# Patient Record
Sex: Female | Born: 1975
Health system: Southern US, Community
[De-identification: ages and names within clinical notes are randomized; demographics above are authoritative.]

## PROBLEM LIST (undated history)

## (undated) DIAGNOSIS — Z8719 Personal history of other diseases of the digestive system: Secondary | ICD-10-CM

## (undated) DIAGNOSIS — Z9289 Personal history of other medical treatment: Secondary | ICD-10-CM

## (undated) DIAGNOSIS — D649 Anemia, unspecified: Secondary | ICD-10-CM

## (undated) DIAGNOSIS — M797 Fibromyalgia: Secondary | ICD-10-CM

## (undated) DIAGNOSIS — C8590 Non-Hodgkin lymphoma, unspecified, unspecified site: Secondary | ICD-10-CM

## (undated) DIAGNOSIS — J329 Chronic sinusitis, unspecified: Secondary | ICD-10-CM

## (undated) DIAGNOSIS — R102 Pelvic and perineal pain: Secondary | ICD-10-CM

## (undated) DIAGNOSIS — N920 Excessive and frequent menstruation with regular cycle: Secondary | ICD-10-CM

## (undated) DIAGNOSIS — R768 Other specified abnormal immunological findings in serum: Secondary | ICD-10-CM

## (undated) DIAGNOSIS — R112 Nausea with vomiting, unspecified: Secondary | ICD-10-CM

## (undated) DIAGNOSIS — E669 Obesity, unspecified: Secondary | ICD-10-CM

## (undated) DIAGNOSIS — J45909 Unspecified asthma, uncomplicated: Secondary | ICD-10-CM

## (undated) DIAGNOSIS — D251 Intramural leiomyoma of uterus: Secondary | ICD-10-CM

## (undated) DIAGNOSIS — M1990 Unspecified osteoarthritis, unspecified site: Secondary | ICD-10-CM

## (undated) DIAGNOSIS — E611 Iron deficiency: Secondary | ICD-10-CM

## (undated) DIAGNOSIS — M2550 Pain in unspecified joint: Secondary | ICD-10-CM

## (undated) DIAGNOSIS — S92902A Unspecified fracture of left foot, initial encounter for closed fracture: Secondary | ICD-10-CM

## (undated) DIAGNOSIS — S92309A Fracture of unspecified metatarsal bone(s), unspecified foot, initial encounter for closed fracture: Secondary | ICD-10-CM

## (undated) DIAGNOSIS — F410 Panic disorder [episodic paroxysmal anxiety] without agoraphobia: Secondary | ICD-10-CM

## (undated) DIAGNOSIS — L709 Acne, unspecified: Secondary | ICD-10-CM

## (undated) DIAGNOSIS — Z9889 Other specified postprocedural states: Secondary | ICD-10-CM

## (undated) DIAGNOSIS — E039 Hypothyroidism, unspecified: Secondary | ICD-10-CM

## (undated) HISTORY — DX: Unspecified asthma, uncomplicated: J45.909

## (undated) HISTORY — DX: Anemia, unspecified: D64.9

## (undated) HISTORY — DX: Acne, unspecified: L70.9

## (undated) HISTORY — PX: BUNIONECTOMY: SHX129

## (undated) HISTORY — DX: Pain in unspecified joint: M25.50

## (undated) HISTORY — PX: DILATION AND EVACUATION: SHX1459

## (undated) HISTORY — PX: APPENDECTOMY: SHX54

## (undated) HISTORY — PX: TUBAL LIGATION: SHX77

## (undated) HISTORY — DX: Chronic sinusitis, unspecified: J32.9

## (undated) HISTORY — DX: Iron deficiency: E61.1

## (undated) HISTORY — DX: Other specified abnormal immunological findings in serum: R76.8

---

## 2003-08-31 ENCOUNTER — Inpatient Hospital Stay (HOSPITAL_COMMUNITY): Admission: AD | Admit: 2003-08-31 | Discharge: 2003-09-03 | Payer: Self-pay | Admitting: Obstetrics and Gynecology

## 2003-09-04 ENCOUNTER — Inpatient Hospital Stay (HOSPITAL_COMMUNITY): Admission: AD | Admit: 2003-09-04 | Discharge: 2003-09-04 | Payer: Self-pay | Admitting: Obstetrics and Gynecology

## 2003-09-04 ENCOUNTER — Encounter: Admission: RE | Admit: 2003-09-04 | Discharge: 2003-10-04 | Payer: Self-pay | Admitting: Obstetrics and Gynecology

## 2003-10-27 ENCOUNTER — Other Ambulatory Visit: Admission: RE | Admit: 2003-10-27 | Discharge: 2003-10-27 | Payer: Self-pay | Admitting: Obstetrics and Gynecology

## 2004-01-26 ENCOUNTER — Emergency Department (HOSPITAL_COMMUNITY): Admission: EM | Admit: 2004-01-26 | Discharge: 2004-01-26 | Payer: Self-pay | Admitting: Emergency Medicine

## 2004-10-13 ENCOUNTER — Ambulatory Visit (HOSPITAL_COMMUNITY): Admission: RE | Admit: 2004-10-13 | Discharge: 2004-10-13 | Payer: Self-pay | Admitting: Obstetrics and Gynecology

## 2004-10-19 ENCOUNTER — Inpatient Hospital Stay (HOSPITAL_COMMUNITY): Admission: AD | Admit: 2004-10-19 | Discharge: 2004-10-19 | Payer: Self-pay | Admitting: Obstetrics and Gynecology

## 2004-11-01 ENCOUNTER — Ambulatory Visit (HOSPITAL_COMMUNITY): Admission: RE | Admit: 2004-11-01 | Discharge: 2004-11-01 | Payer: Self-pay | Admitting: Obstetrics and Gynecology

## 2004-11-09 ENCOUNTER — Ambulatory Visit: Payer: Self-pay | Admitting: *Deleted

## 2004-11-09 ENCOUNTER — Ambulatory Visit (HOSPITAL_COMMUNITY): Admission: RE | Admit: 2004-11-09 | Discharge: 2004-11-09 | Payer: Self-pay | Admitting: Obstetrics and Gynecology

## 2004-11-12 ENCOUNTER — Ambulatory Visit: Payer: Self-pay | Admitting: Family Medicine

## 2004-11-15 ENCOUNTER — Ambulatory Visit (HOSPITAL_COMMUNITY): Admission: RE | Admit: 2004-11-15 | Discharge: 2004-11-15 | Payer: Self-pay | Admitting: Obstetrics and Gynecology

## 2004-12-26 ENCOUNTER — Inpatient Hospital Stay (HOSPITAL_COMMUNITY): Admission: AD | Admit: 2004-12-26 | Discharge: 2004-12-28 | Payer: Self-pay | Admitting: Obstetrics and Gynecology

## 2005-02-15 ENCOUNTER — Ambulatory Visit (HOSPITAL_COMMUNITY): Admission: RE | Admit: 2005-02-15 | Discharge: 2005-02-15 | Payer: Self-pay | Admitting: Obstetrics and Gynecology

## 2006-07-11 ENCOUNTER — Other Ambulatory Visit: Admission: RE | Admit: 2006-07-11 | Discharge: 2006-07-11 | Payer: Self-pay | Admitting: Obstetrics and Gynecology

## 2006-09-14 DIAGNOSIS — Z8719 Personal history of other diseases of the digestive system: Secondary | ICD-10-CM

## 2006-09-14 HISTORY — DX: Personal history of other diseases of the digestive system: Z87.19

## 2006-09-30 ENCOUNTER — Inpatient Hospital Stay (HOSPITAL_COMMUNITY): Admission: EM | Admit: 2006-09-30 | Discharge: 2006-10-02 | Payer: Self-pay | Admitting: Emergency Medicine

## 2006-09-30 ENCOUNTER — Encounter (INDEPENDENT_AMBULATORY_CARE_PROVIDER_SITE_OTHER): Payer: Self-pay | Admitting: Surgery

## 2008-06-02 ENCOUNTER — Other Ambulatory Visit: Admission: RE | Admit: 2008-06-02 | Discharge: 2008-06-02 | Payer: Self-pay | Admitting: Obstetrics and Gynecology

## 2009-12-14 LAB — CONVERTED CEMR LAB: Pap Smear: NORMAL

## 2010-04-29 ENCOUNTER — Ambulatory Visit: Payer: Self-pay | Admitting: Family Medicine

## 2010-04-29 DIAGNOSIS — R51 Headache: Secondary | ICD-10-CM | POA: Insufficient documentation

## 2010-04-29 DIAGNOSIS — J45909 Unspecified asthma, uncomplicated: Secondary | ICD-10-CM | POA: Insufficient documentation

## 2010-04-29 DIAGNOSIS — F411 Generalized anxiety disorder: Secondary | ICD-10-CM | POA: Insufficient documentation

## 2010-04-29 DIAGNOSIS — G43909 Migraine, unspecified, not intractable, without status migrainosus: Secondary | ICD-10-CM | POA: Insufficient documentation

## 2010-04-29 DIAGNOSIS — J329 Chronic sinusitis, unspecified: Secondary | ICD-10-CM | POA: Insufficient documentation

## 2010-04-29 DIAGNOSIS — R519 Headache, unspecified: Secondary | ICD-10-CM | POA: Insufficient documentation

## 2010-06-17 NOTE — Letter (Signed)
Summary: Out of Work  Adult nurse at [employer]  [address]   [place], [place] [address]   Phone: [phone]  Fax: [phone]    April 29, 2010   Employee:  Basilia Jumbo    To Whom It May Concern:   For Medical reasons, please excuse the above named employee from work for the following dates:  Start:   Thursday, 04/29/2010  End:   Thursday, 04/29/2010  If you need additional information, please feel free to contact our office.         Sincerely,        Evelena Peat, MD

## 2010-06-17 NOTE — Assessment & Plan Note (Signed)
Summary: new to est-requesting cpx   Vital Signs:  Patient profile:   35 year old female Menstrual status:  regular LMP:     03/30/2010 Height:      63.25 inches Weight:      163 pounds BMI:     28.75 Temp:     98.8 degrees F oral Pulse rate:   72 / minute Pulse rhythm:   regular Resp:     12 per minute BP sitting:   130 / 90  (left arm) Cuff size:   regular  Vitals Entered By: Sid Falcon LPN (April 29, 2010 2:58 PM) LMP (date): 03/30/2010     Menstrual Status regular Enter LMP: 03/30/2010 Last PAP Result normal   History of Present Illness: Patient is seen to establish care.  PMH signif for childhood asthma (mild intermittent), migraine headaches, frequent UTIs, and ? of panic attacks.  Takes Doxycycline per derm for some mild acne. BTL 2006.  Appy 2009.  NKDA.  FH unrevealing.  Married with 4 children.  [employer].  Nonsmoker. No ETOH.   Has frequent headaches which seem to be of different type. One is stress induced and unilateral, throbbing, severe, with freq visual aura. Another, more frequent, post occipital and assoc with nasal congestion.  Assoc with  facial pressure.  Has been on multiple courses of antibiotics without improvement. Nasonex without relief.  Has episodes of anxiety that come in different settings without warning.  Symptoms are sometimes severe and relatively short lived-usually only few minutes.  Multiple physical symptoms including sweats, nausea, palpitations, dyspnea,   Occur about 2 to 3 times per week.  Not assoc with any  specific stressors.  Preventive Screening-Counseling & Management  Alcohol-Tobacco     Smoking Status: never  Caffeine-Diet-Exercise     Does Patient Exercise: yes  Past History:  Family History: Last updated: 04/29/2010 Maternal grandmother, unknown [place]  Social History: Last updated: 04/29/2010 Occupation:  [employer] Married Never Smoked Alcohol use-no Regular exercise-yes 5  pregnancies 4 live births  Risk Factors: Exercise: yes (04/29/2010)  Risk Factors: Smoking Status: never (04/29/2010)  Past Medical History: Asthma, mild intermittent. Headache/migraines ?panic attacks  Past Surgical History: Appendectomy 2009 Tubal ligation 2006 PMH-FH-SH reviewed for relevance  Family History: Maternal grandmother, unknown [place]  Social History: Occupation:  [employer] Married Never Smoked Alcohol use-no Regular exercise-yes 5 pregnancies 4 live births Smoking Status:  never Occupation:  employed Does Patient Exercise:  yes  Review of Systems  The patient denies anorexia, fever, weight loss, weight gain, vision loss, decreased hearing, chest pain, syncope, dyspnea on exertion, peripheral edema, prolonged cough, headaches, hemoptysis, abdominal pain, melena, hematochezia, severe indigestion/heartburn, hematuria, incontinence, muscle weakness, depression, and enlarged lymph nodes.    Physical Exam  General:  Well-developed,well-nourished,in no acute distress; alert,appropriate and cooperative throughout examination Head:  Normocephalic and atraumatic without obvious abnormalities. No apparent alopecia or balding. Eyes:  No corneal or conjunctival inflammation noted. EOMI. Perrla. Funduscopic exam benign, without hemorrhages, exudates or papilledema. Vision grossly normal. Ears:  External ear exam shows no significant lesions or deformities.  Otoscopic examination reveals clear canals, tympanic membranes are intact bilaterally without bulging, retraction, inflammation or discharge. Hearing is grossly normal bilaterally. Mouth:  Oral mucosa and oropharynx without lesions or exudates.  Teeth in good repair. Neck:  No deformities, masses, or tenderness noted. Lungs:  Normal respiratory effort, chest expands symmetrically. Lungs are clear to auscultation, no crackles or wheezes. Heart:  Normal rate and regular rhythm. S1 and S2  normal without gallop,  murmur, click, rub or other extra sounds. Extremities:  No clubbing, cyanosis, edema, or deformity noted with normal full range of motion of all joints.   Neurologic:  alert & oriented X3, cranial nerves II-XII intact, and strength normal in all extremities.   Cervical Nodes:  No lymphadenopathy noted Psych:  normally interactive, good eye contact, not anxious appearing, and not depressed appearing.     Impression & Recommendations:  Problem # 1:  MIGRAINE HEADACHE (ICD-346.90) samples of Maxalt MLT to try as needed discussed possible triggers.  Problem # 2:  SINUSITIS, RECURRENT (ICD-473.9) rec trial of Netti pot with saline irrigation. Her updated medication list for this problem includes:    Doxycycline Hyclate 50 Mg Caps (Doxycycline hyclate) ..... One tab two times a day  Problem # 3:  ANXIETY STATE, UNSPECIFIED (ICD-300.00) suspect Panic Attacks by hx.  Discusse use of preventitive such as SSRI and she is reluctant at this time.  Complete Medication List: 1)  Doxycycline Hyclate 50 Mg Caps (Doxycycline hyclate) .... One tab two times a day  Patient Instructions: 1)  Maxalt MLT 10 mg one at onset of migraine and may repeat one in 2 hours as needed 2)  Netti pot for sinus irrigation use twice daily.   Orders Added: 1)  New Patient Level III [16109]    Preventive Care Screening  Pap Smear:    Date:  12/14/2009    Results:  normal   Last Tetanus Booster:    Date:  05/17/2007    Results:  Historical

## 2010-06-30 ENCOUNTER — Emergency Department (HOSPITAL_COMMUNITY): Payer: Federal, State, Local not specified - PPO

## 2010-06-30 ENCOUNTER — Emergency Department (HOSPITAL_COMMUNITY)
Admission: EM | Admit: 2010-06-30 | Discharge: 2010-06-30 | Disposition: A | Discharge status: Home / Self Care | Payer: Federal, State, Local not specified - PPO | Attending: Emergency Medicine | Admitting: Emergency Medicine

## 2010-06-30 ENCOUNTER — Encounter (HOSPITAL_COMMUNITY): Payer: Self-pay

## 2010-06-30 DIAGNOSIS — R531 Weakness: Secondary | ICD-10-CM

## 2010-06-30 DIAGNOSIS — R42 Dizziness and giddiness: Secondary | ICD-10-CM | POA: Insufficient documentation

## 2010-06-30 DIAGNOSIS — R5383 Other fatigue: Secondary | ICD-10-CM | POA: Insufficient documentation

## 2010-06-30 DIAGNOSIS — J3489 Other specified disorders of nose and nasal sinuses: Secondary | ICD-10-CM | POA: Insufficient documentation

## 2010-06-30 DIAGNOSIS — R0789 Other chest pain: Secondary | ICD-10-CM | POA: Insufficient documentation

## 2010-06-30 DIAGNOSIS — R0602 Shortness of breath: Secondary | ICD-10-CM | POA: Insufficient documentation

## 2010-06-30 DIAGNOSIS — R5381 Other malaise: Secondary | ICD-10-CM | POA: Insufficient documentation

## 2010-06-30 LAB — URINALYSIS, ROUTINE W REFLEX MICROSCOPIC
Bilirubin Urine: NEGATIVE
Hgb urine dipstick: NEGATIVE
Ketones, ur: NEGATIVE mg/dL
Nitrite: NEGATIVE
Protein, ur: NEGATIVE mg/dL
Specific Gravity, Urine: 1.021 (ref 1.005–1.030)
Urine Glucose, Fasting: NEGATIVE mg/dL
Urobilinogen, UA: 0.2 mg/dL (ref 0.0–1.0)
pH: 6.5 (ref 5.0–8.0)

## 2010-06-30 LAB — BASIC METABOLIC PANEL WITH GFR
BUN: 9 mg/dL (ref 6–23)
CO2: 24 meq/L (ref 19–32)
Calcium: 8.7 mg/dL (ref 8.4–10.5)
Chloride: 107 meq/L (ref 96–112)
Creatinine, Ser: 0.76 mg/dL (ref 0.4–1.2)
GFR calc non Af Amer: >60 mL/min
Glucose, Bld: 97 mg/dL (ref 70–99)
Potassium: 3.4 meq/L — ABNORMAL LOW (ref 3.5–5.1)
Sodium: 137 meq/L (ref 135–145)

## 2010-06-30 LAB — PREGNANCY, URINE: Preg Test, Ur: NEGATIVE

## 2010-06-30 LAB — CBC
HCT: 38.8 % (ref 36.0–46.0)
Hemoglobin: 13.7 g/dL (ref 12.0–15.0)
MCH: 32.4 pg (ref 26.0–34.0)
MCHC: 35.3 g/dL (ref 30.0–36.0)
MCV: 91.7 fL (ref 78.0–100.0)
Platelets: 228 K/uL (ref 150–400)
RBC: 4.23 MIL/uL (ref 3.87–5.11)
RDW: 12.8 % (ref 11.5–15.5)
WBC: 6.7 K/uL (ref 4.0–10.5)

## 2010-06-30 LAB — DIFFERENTIAL
Lymphs Abs: 2.3 10*3/uL (ref 0.7–4.0)
Monocytes Absolute: 0.5 10*3/uL (ref 0.1–1.0)
Monocytes Relative: 7 % (ref 3–12)
Neutro Abs: 3.8 10*3/uL (ref 1.7–7.7)

## 2010-09-28 NOTE — Op Note (Signed)
Sydney Vaughn, FAISON NO.:  [id number]   MEDICAL RECORD NO.:  [id number]          PATIENT TYPE:  INP   LOCATION:  0454                         FACILITY[facility]  [facility]   PHYSICIAN:  Ardeth Sportsman, MD     DATE OF BIRTH:  1975/05/30   DATE OF PROCEDURE:  09/30/2006  DATE OF DISCHARGE:                               OPERATIVE REPORT   SURGEON:  Ardeth Sportsman, M.D.   ASSISTANT:  None.   PREOPERATIVE DIAGNOSIS:  Acute appendicitis.   POSTOPERATIVE DIAGNOSIS:  Acute suppurative appendicitis.   PROCEDURE PERFORMED:  Diagnostic laparoscopy with appendectomy.   ANESTHESIA:  1. General.  2. Local anesthetic in a field block around all port sites.   SPECIMENS:  Appendix.   DRAINS:  None.   ESTIMATED BLOOD LOSS:  Less than 5 mL.   COMPLICATIONS:  None apparent.   INDICATIONS FOR PROCEDURE:  Ms. Sydney Vaughn is a 35 year old female with a  history of abdominal pain that focused down in the right lower quadrant  with exam, story, and CT scan findings strongly suggestive of acute  appendicitis.  The pathophysiology of appendicitis was explained,  options were discussed, differential diagnosis discussed.  A  recommendation was made for diagnostic laparoscopy with appendectomy.  The risks of stroke, heart attack, deep venous thrombosis, pulmonary  embolism, and death were discussed.  The risks of bleeding, need for  transfusion, wound infection, abscess, injury to other organs,  incisional pain, bowel obstruction, prolonged recovery, and other risks  were discussed.  Questions were answered and she and her mother agreed  to proceed.   OPERATIVE FINDINGS:  She had obvious acute appendicitis with some  suppuration, primarily in the right lower quadrant, but some slightly in  the pelvis, as well.  There was only some mild adhesions, the rest of  her anatomy appeared to be unremarkable.   DESCRIPTION OF PROCEDURE:  Informed consent was confirmed.  The patient  had already  received IV antibiotics.  She underwent general anesthesia  without difficulty.  She had sequential compressive devices active  during the entire case.  She had a Foley catheter placed sterilely.  She  was positioned supine with both arms tucked.  Her abdomen was prepped  and draped in a sterile fashion.   Entry was gained in the abdomen with the patient in steep reversed  Trendelenburg right side up using a 5 mm 0 degree scope and optical  entry.  Capnoperitoneum to 15 mmHg provided good abdominal insufflation.  Under direct visualization, a 5 mm port was placed through the umbilicus  and a 12 mm port was placed in the suprapubic region near the midline.  There was no evidence of any injury.  Diagnostic laparoscopy was  performed and showed no abnormalities except for down in the right lower  quadrant where an inflamed blind ended loop consistent with the appendix  was noted with some separation consistent with appendicitis.  There was  a little bit of murky seropurulent fluid down in the pelvis, as well.  Mild adhesions were freed up carefully and the appendix was able  to be  elevated anteriorly.  A window was made in the base of the appendix and  the appendix was transected using a laparoscopic stapler with a good  result taking a healthy cuff of the cecum.  The mesentery was ligated  using controlled focused cautery with a good result.  The gallbladder  was removed inside an endocatch bag intact.  This was removed out the 12  mm port.   The fascial defect of the 12 mm port was reapproximated using a 0 Vicryl  stitch using laparoscopic suture passer under direct visualization.  Copious irrigation was done of over 2 liters of saline with ultimately a  nice clear return.  The staple line was nice and intact and the  appendiceal mesentery showed no bleeding.  The umbilical port was  removed.  There was no bleeding on the lower two abdominal ports.  Capnoperitoneum was evacuated.  The  superior right upper quadrant port  site was removed.  The fascial stitch was tied down.  The skin was  closed using 4-0 Monocryl stitch.  A sterile dressing was applied.  The  patient was extubated and taken to the recovery room in stable  condition.   I explained the operative findings to the patient's mother.  Postop  instructions were given, questions answered.  She expressed  understanding and appreciation.      Ardeth Sportsman, MD  Electronically Signed     SCG/MEDQ  D:  09/30/2006  T:  09/30/2006  Job:  [id number]   cc:   Leonette Most A. Sydnee Cabal, MD  Fax: [phone]

## 2010-09-28 NOTE — Discharge Summary (Signed)
NAMEVERLON, CARCIONE NO.:  [id number]   MEDICAL RECORD NO.:  [id number]          PATIENT TYPE:  INP   LOCATION:  1540                         FACILITY:  [facility]   PHYSICIAN:  Ardeth Sportsman, MD     DATE OF BIRTH:  12-Jan-1976   DATE OF ADMISSION:  09/29/2006  DATE OF DISCHARGE:                               DISCHARGE SUMMARY   DIAGNOSES:  Acute suppurative appendicitis.   PROCEDURES PERFORMED:  Laparoscopic diagnostic laparoscopy with  appendectomy on Sep 30, 2006.   SUMMARY OF HOSPITAL COURSE:  Ms. Thurlow is a 35 year old female who had  appendicitis with suppuration who underwent a laparoscopic appendectomy.  Postoperatively, she did have some pain issues and dropped her  hemoglobin down to around 11- 11.5, where it stabilized the last 24  hours.  She was having flatus, no nausea or vomiting, and tolerating at  least liquids and some solid diet.   Based on these improvements I thought it would be reasonable for her to  be discharged home with the following instructions:  1. She should do ice packs or heating pad around-the-clock.  2. She should do ibuprofen 600-800 mg q.a.c. and q.h.s. x1 week.  3. She should take Percocet 5/325 one to two p.o. q.4h. p.r.n.      breakthrough pain.  4. She should call if she has any fevers, chills, sweats, nausea or      vomiting, uncontrolled diarrhea or abdominal pain, or any other      concerns.  5. She should shower daily, keep her incision dry.  Call if she has      any evidence of wound infection, drainage, or worsening concerns.  6. She should take Augmentin 875 mg p.o. b.i.d. x7 days to decrease      her risk of wound infection or abscess.  7. She should follow up and see me about 2-3 weeks to make sure there      are not any issues.      Ardeth Sportsman, MD  Electronically Signed     SCG/MEDQ  D:  10/02/2006  T:  10/02/2006  Job:  [id number]

## 2010-09-28 NOTE — Consult Note (Signed)
NAMEZORANA, BROCKWELL NO.:  [id number]   MEDICAL RECORD NO.:  [id number]          PATIENT TYPE:  EMS   LOCATION:  ED                           [facility]AC[facility]  [facility]   PHYSICIAN:  Ardeth Sportsman, MD     DATE OF BIRTH:  [birth year]   DATE OF CONSULTATION:  09/30/2006  DATE OF DISCHARGE:                                 CONSULTATION   REASON FOR CONSULTATION:  Probable appendicitis.   HISTORY OF PRESENT ILLNESS:  Ms. Massman is a 35 year old female  previously healthy who started having diffuse upper abdominal pain  especially in the right subcostal and epigastric regions.  She had never  had pain like this before.  She started getting some nausea and actually  threw up a little bit of liquid.  The pain gradually intensified over  the day and she came to the emergency room to be evaluated.  The pain  has now migrated more down to the right lower quadrant and is much more  focused and intense.  Her appetite has decreased.  She is feeling rather  cold and has some chills and feels some subjective fevers, as well.  She  denies any dysphagia, heartburn, or reflux.  She has never had any  history of inflammatory bowel disease, hematochezia, or melena.   The emergency department had ruled out any cardiac or pulmonary etiology  and CT scan shows findings concerning for appendicitis and I am asked to  evaluate.   PAST MEDICAL HISTORY:  Otherwise negative.   PAST SURGICAL HISTORY:  Tubal ligation.   ALLERGIES:  None.   SOCIAL HISTORY:  No tobacco, alcohol, or drug use.  She lives with her  mother.  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   REVIEW OF SYSTEMS:  CONSTITUTIONAL:  Positive for fevers and chills.  No  major change in her weight.  Otherwise, negative.  EENT, cardiac,  respiratory, otherwise are negative.  GYN, GU, musculoskeletal,  neurological, psychiatric, allergy, hematology, lymph, dermatologic are  otherwise negative.  GI as noted per  HPI.   PHYSICAL EXAMINATION:  VITAL SIGNS:  Temperature 98.7, pulse 80s to 108,  respirations around 16, blood pressure systolic around 100 to 130. She  is 8 out of 10 pain.  She has probably got a BMI around 30.  GENERAL:  Well developed, well nourished overweight female, obviously  uncomfortable but not frankly toxic.  PSYCHIATRIC:  She is a little anxious but consolable.  She has at least  average intelligence.  No evidence of dementia or psychosis or paranoia.  NEUROLOGICAL:  Cranial nerves 2-12 intact.  Hand grip 5/5, equal, and  symmetrical.  No resting or intention tremors.  HEENT:  Pupils equal, round, and reactive to light.  Extraocular  movements intact.  Sclerae nonicteric or injected.  She is normocephalic  with no facial asymmetry.  Mucous membranes are dry.  The nasopharynx  and oropharynx are clear.  NECK:  Supple without masses.  Trachea midline.  HEART:  Regular rate and rhythm, no murmurs, gallops, and rubs.  CHEST: Clear to auscultation  bilaterally.  No wheezes, rales or rhonchi.  No significant pain to ribs or sternal compression.  ABDOMEN:  Soft and not particularly distended.  She has exquisite  tenderness in the right lower quadrant at McBurney's point.  No Murphy's  sign.  The rest of her quadrants of her abdomen were rather soft and not  too involved.  Her umbilicus does not have evidence of hernia.  GU:  Normal female external genitalia with no inguinal herniae.  No  vaginal bleeding or discharge.  RECTAL:  Deferred.  EXTREMITIES:  No cyanosis or clubbing.  MUSCULOSKELETAL:  Full range of motion of shoulders, wrists, hips,  knees, and ankles.  LYMPHS:  No head, neck, axillary, or groin lymphadenopathy.  SKIN:  No petechiae, no purpura, no lesions.   LABORATORY DATA:  White count is elevated around 15.6 with a left shift.  Potassium is 3.6 which is borderline low.  Urinalysis is negative.  CT  scan shows classic appendicitis with no definite abscess.    ASSESSMENT/PLAN:  35 year old female with classic story, exam, and CT  scan findings strongly suggestive of acute appendicitis.  The anatomy  and physiology of the digestive tract was discussed.  The  pathophysioloigy of appendicitis with its risks of perforation and  abscess formation was explained.  The options were discussed and  recommendation was made for diagnostic laparoscopy with appendectomy.  The risks such as stroke, heart attack, deep venous thrombosis,  pulmonary embolism, and death were discussed.  The risks such as  bleeding with need for transfusion, wound infection, abscess, injury to  other organs, potential hernia, and prolonged pain, and other risks were  discussed.  The possible hospital course, whether it be less than 24  hour stay to over a weeks stay with IV antibiotics was discussed.  Questions were answered and she and her mother agreed to proceed.      Ardeth Sportsman, MD  Electronically Signed     SCG/MEDQ  D:  09/30/2006  T:  09/30/2006  Job:  [id number]   cc:   Leonette Most A. Sydnee Cabal, MD  Fax: [phone]

## 2010-10-01 NOTE — H&P (Signed)
Sydney Vaughn, TIGUE NO.:  [id number]   MEDICAL RECORD NO.:  [id number]           PATIENT TYPE:   LOCATION:                                 FACILITY:   PHYSICIAN:  Charles A. Delcambre, MDDATE OF BIRTH:  1975-11-21   DATE OF ADMISSION:  12/29/2004  DATE OF DISCHARGE:                                HISTORY & PHYSICAL   This patient is being admitted on December 29, 2004 for induction secondary to  term pregnancy and positive ANA.  She has been on baby aspirin a day and has  had antenatal testing normal up to this point.  Pregnancy has otherwise been  complicated by positive RPR false positive with TPPA negative.  Blood tests  have been noted to be normal at blood type O+.  Antibody screen negative.  Rubella immune.  Hepatitis B surface antigen negative.  HIV declined.  Quad  screen declined.  One-hour Glucola 126.  Hemoglobin Oct 07, 2004 12.5.  Pap  negative.  GC/Chlamydia negative.  Urinalysis negative.  TSH normal.  Cystic  fibrosis declined.  EDC is December 26, 2004.  The patient will be at 40 weeks  and 3 days on induction date.  She also desires tubal ligation.  We plan  this on an interval basis unless the patient undergoes cesarean section  during this admission.  She did have some first trimester bleeding that did  resolve.   PAST MEDICAL HISTORY:  1.  Asthma.  No medications at this time required.  2.  Difficult epidural with spinal headaches and difficult induction last      pregnancy, long induction after induction with an unfavorable cervix.   PAST SURGICAL HISTORY:  None.   MEDICATIONS:  1.  Baby aspirin a day.  2.  Prenatal vitamins.   ALLERGIES:  No known drug allergies.   SOCIAL HISTORY:  No tobacco, ethanol, or drug use.  Patient is married in a  monogamous relationship with her husband.   FAMILY HISTORY:  Noncontributory.   REVIEW OF SYSTEMS:  As noted above.   PHYSICAL EXAMINATION:  GENERAL:  Alert and oriented x3.  No distress.  VITAL SIGNS:  Blood pressure 118/78, respirations 18, pulse 90, weight 203  pounds.  HEENT:  Grossly within normal limits.  NECK:  Supple without thyromegaly or adenopathy.  LUNGS:  Clear bilaterally.  BACK:  No CVAT.  Vertebral column nontender to palpation.  BREASTS:  No masses, tenderness, discharge, skin, nipple changes  bilaterally.  ABDOMEN:  Soft, nontender, gravid.  Fundal height 38 cm.  PELVIC:  Cervix posterior, roughly 2 cm.  Difficult to palpate secondary to  patient discomfort with examination.  Examination terminated at that point.  Soft and could not tell effacement.  EXTREMITIES:  Mild edema bilaterally.   ASSESSMENT:  Intrauterine pregnancy.  Plan to be at least 40 weeks 3 days at  time of induction.  Positive ANA.  False positive RPR.  TPPA negative.   PLAN:  Admission.  Plan to high dose Pitocin induction one of August 16.  All questions were answered and she gives informed  consent.  Will proceed as  outlined.  She is group B Strep negative as well and will proceed as  outlined.      Charles A. Sydnee Cabal, MD  Electronically Signed     CAD/MEDQ  D:  12/16/2004  T:  12/16/2004  Job:  [id number]

## 2010-10-01 NOTE — Op Note (Signed)
Sydney Vaughn, BELLES NO.:  [id number]   MEDICAL RECORD NO.:  [id number]          PATIENT TYPE:  AMB   LOCATION:  SDC                           FACILITY:  [facility]   PHYSICIAN:  Charles A. Delcambre, MDDATE OF BIRTH:  26-May-1975   DATE OF PROCEDURE:  02/15/2005  DATE OF DISCHARGE:                                 OPERATIVE REPORT   PREOPERATIVE DIAGNOSIS:  Undesired fertility.   POSTOPERATIVE DIAGNOSIS:  Undesired fertility.   PROCEDURE:  Laparoscopic bilateral tubal ligation with Falope rings.   SURGEON:  Charles A. Sydnee Cabal, M.D.   ASSISTANT:  None.   COMPLICATIONS:  None.   ESTIMATED BLOOD LOSS:  None.   FINDINGS:  Normal pelvis.   Instrument, sponge and needle count were correct x2.   ANESTHESIA:  General by endotracheal route.   DESCRIPTION OF PROCEDURE:  The patient was taken to the operating room and  placed in the supine position.  A general anesthetic was induced without  difficulty.  She was then placed in the dorsal lithotomy position in  universal stirrups and a sterile prep and drape was undertaken.  A Cohen  cannula was placed on the cervix with a single-tooth tenaculum and attention  was turned to the abdomen.  Marcaine 0.25% plain was then injected at the  umbilicus and suprapubic areas for the trocars and a 1 cm incision was made  at the umbilicus.  A Veress needle was placed with anterior traction on the  abdominal wall, aspiration, injection, reaspiration, hanging drop test and  insufflation to less than 8 mmHg, a 2 L CO2 insufflation per minute, all  indicating intraperitoneal location.  Adequate pneumoperitoneum was obtained  at 2.5 L insufflation and the Veress needle was removed with anterior  traction on the abdominal wall.  A 10 mm port was then placed.  Immediate  verification of intraperitoneal location was made by placement of the scope.  No damage to bowel, bladder or vascular structures was noted.  Under direct  visualization  the Falope ring applicator was placed in through a separate  stab incision two fingerbreadths above the symphysis pubis.  No damage to  bowel, bladder or vascular structures was noted.  The Falope rings were then  placed on the fallopian tubes approximately 2.5 cm from the uterine cornual  regions bilaterally.  Adequate knuckle of tube was pulled up through the  ring and blanched on either side.  There was no damage to surrounding  structures.  The fallopian tubes were clearly identified.  Desufflation was  allowed to occur and the peritoneal edges were visualized at low pressure,  and hemostasis was excellent.  Vicryl 0 was used on the fascia in the  umbilical trocar site  to close the fascia in a single stitch.  Subcuticular 3-0 Monocryl was used  to close the umbilical incision site.  A pressure dressing was applied.  Dermabond was then used to close the lower trocar site.  Vaginal instruments  were removed, hemostasis was excellent.  The patient was awakened and taken  to recovery with physician in attendance.  Charles A. Sydnee Cabal, MD  Electronically Signed     CAD/MEDQ  D:  02/15/2005  T:  02/15/2005  Job:  [id number]

## 2010-10-01 NOTE — Consult Note (Signed)
NAME:  Sydney Vaughn, Sydney Vaughn NO.:  [id number]   MEDICAL RECORD NO.:  [id number]                   PATIENT TYPE:  MAT   LOCATION:  [facility]                                 FACILITY:  [facility]   PHYSICIAN:  Melvyn Novas, M.D.               DATE OF BIRTH:  03-Aug-1975   DATE OF CONSULTATION:  DATE OF DISCHARGE:                                   CONSULTATION   NEUROLOGY CONSULTATION:   REASON FOR CONSULTATION:  This is a 35 year old Caucasian right-handed  female who delivered 3 days ago a healthy baby girl.  The patient received  during the delivery epidural anesthesia that was described as a wet tap.  The patient developed severe headaches within hours after the delivery of a  healthy baby girl.  She says that the headaches have been the same for 3  days, have not eased or progressed and are worsened at home with postural  changes and associated with photophobia and nausea.  She has not had fever,  neck stiffness, lymph node swelling, or mental status changes.  She does  state that she has a hard time sleeping because of her headaches and that  she could not tolerate Percocet which was prescribed after her discharge  from hospital yesterday because of nausea.   PHYSICAL EXAMINATION:  Vital signs stable, afebrile.  Mucous membranes were  profuse.  No tremor.  No focal shakes.  No weakness.  No edema.  Peripheral  pulses are easily palpable.  Temporal arteries are not palpable.  No  auscultable bruits.   Mental status alert and oriented x3.  Cranial nerves:  Full extraocular  movements.  No papilledema.  Full visual fields.  Facial symmetry/sensory  intact.  Neck range is full, the patient tolerates passive neck flexion,  extension and lateral tilt but states that her headaches get worse when she  gets up.  Tongue and uvula were in midline.  There is no dysphagia.   Motor exam 5/5 equal strength, tone, and mass.  This is true for proximal as  well as distal muscle  groups, dorsiflexion, plantar flexion and grip.  Fine  motor skills are tested by finger-nose and were also intact without tremor,  ataxia, or dysmetria.  Gait testing was deferred.   ASSESSMENT AND PLAN:  Spinal headaches, most likely low pressure headaches.  The patient was asked to return home today to rest, take plenty of oral  fluids, her nausea will be treated with Phenergan 25 mg q.4-6h. p.o. and  narcotic headache medication, I prescribed Tylenol No. 3 also q.4-6h. p.r.n.  p.o.  Prescriptions were placed with the attending nurse.  The patient will  be discharged to her private home in the presence of her husband.      ___________________________________________  Melvyn Novas, M.D.   CD/MEDQ  D:  09/04/2003  T:  09/05/2003  Job:  [id number]   cc:   Guy Sandifer. Arleta [place], M.D.  [address]  [place]  [place] [address]  Fax: [phone]   [facility]

## 2010-10-01 NOTE — H&P (Signed)
Sydney Vaughn, BARNIER NO.:  [id number]   MEDICAL RECORD NO.:  [id number]           PATIENT TYPE:   LOCATION:                                 FACILITY:   PHYSICIAN:  Charles A. Delcambre, MD    DATE OF BIRTH:   DATE OF ADMISSION:  02/15/2005  DATE OF DISCHARGE:                                HISTORY & PHYSICAL   REASON FOR ADMISSION:  Laparoscopic tubal ligation.  She gives informed  consent except for some attacks of bleeding, bowel or bladder damage,  ureteral damage, blood products risks including hepatitis and HIV exposure,  1/400 failure risk of approximating and aware of permanence and  reversibility by design of this procedure.  Alternative forms of  contraception have been discussed as well.   HISTORY OF PRESENT ILLNESS:  She is a 35 year old, para 4-0-1-4, eight weeks  and will be approximately seven weeks postpartum.   PAST MEDICAL HISTORY:  Childhood asthma.   PAST SURGICAL HISTORY:  Spontaneous vaginal delivery x4.   MEDICATIONS:  Prenatal vitamins.   ALLERGIES:  No known drug allergies.   SOCIAL HISTORY:  No tobacco, ethanol or drug use.  The patient is married  with a monogamous relationship with her husband.   FAMILY HISTORY:  Hyperthyroidism in her mother.  No other major medical  illnesses in her family.   REVIEW OF SYSTEMS:  No shortness of breath, chest pain, headaches, wheezing,  diarrhea, constipation, bleeding, nausea or vomiting.   PHYSICAL EXAMINATION:  GENERAL:  Alert and oriented x3.  No distress.  VITAL SIGNS:  Blood pressure 110/70, weight 178 pounds, respirations 16.  Pulse 80.  HEENT:  Grossly within normal limits.  NECK:  Supple without thyromegaly or adenopathy.  LUNGS:  Clear bilaterally.  BACK:  Negative CVAT.  Vertebral column nontender to palpation.  HEART:  Regular rate and rhythm without murmur, rub or gallop.  BREASTS:  Symmetrical and otherwise exam deferred.  ABDOMEN:  Soft, flat and nontender. No  hepatosplenomegaly or other masses  noted.  PELVIC:  Normal external genitalia noted.  Bartholin, Urethral and Skene  glands within normal limits.  Vault without discharge or lesions.  Multiparous cervix is noted.  Uterus not enlarged, mid plane.  Adnexa  nontender without masses bilaterally.  Ovaries palpable, normal size  bilaterally.   IMPRESSION:  Undesired fertility to be admitted for laparoscopic tubal  ligation.   PLAN:  NPO past midnight.  Preoperative CBC is drawn here in the office  today.  Returns hemoglobin 14.2, hematocrit 41.1, WBC 7.5, platelets 253.  Admit to same-day surgery as noted above.  Procedure as outlined.      Charles A. Sydnee Cabal, MD  Electronically Signed     CAD/MEDQ  D:  02/09/2005  T:  02/09/2005  Job:  [id number]

## 2011-08-15 DIAGNOSIS — J309 Allergic rhinitis, unspecified: Secondary | ICD-10-CM | POA: Insufficient documentation

## 2012-08-14 DIAGNOSIS — S92902A Unspecified fracture of left foot, initial encounter for closed fracture: Secondary | ICD-10-CM

## 2012-08-14 HISTORY — DX: Unspecified fracture of left foot, initial encounter for closed fracture: S92.902A

## 2012-10-14 DIAGNOSIS — S92309A Fracture of unspecified metatarsal bone(s), unspecified foot, initial encounter for closed fracture: Secondary | ICD-10-CM

## 2012-10-14 HISTORY — DX: Fracture of unspecified metatarsal bone(s), unspecified foot, initial encounter for closed fracture: S92.309A

## 2012-11-04 DIAGNOSIS — M79671 Pain in right foot: Secondary | ICD-10-CM | POA: Insufficient documentation

## 2012-12-25 ENCOUNTER — Encounter: Payer: Self-pay | Admitting: Nurse Practitioner

## 2012-12-25 ENCOUNTER — Telehealth: Payer: Self-pay | Admitting: Family Medicine

## 2012-12-25 ENCOUNTER — Encounter: Payer: Self-pay | Admitting: Family Medicine

## 2012-12-25 ENCOUNTER — Ambulatory Visit (INDEPENDENT_AMBULATORY_CARE_PROVIDER_SITE_OTHER): Payer: Federal, State, Local not specified - PPO | Admitting: Nurse Practitioner

## 2012-12-25 VITALS — BP 110/70 | HR 82 | Temp 98.0°F | Ht 63.25 in | Wt 179.5 lb

## 2012-12-25 DIAGNOSIS — J329 Chronic sinusitis, unspecified: Secondary | ICD-10-CM

## 2012-12-25 DIAGNOSIS — R635 Abnormal weight gain: Secondary | ICD-10-CM

## 2012-12-25 DIAGNOSIS — Z0000 Encounter for general adult medical examination without abnormal findings: Secondary | ICD-10-CM

## 2012-12-25 LAB — HEPATIC FUNCTION PANEL
AST: 20 U/L (ref 0–37)
Albumin: 4.2 g/dL (ref 3.5–5.2)
Alkaline Phosphatase: 94 U/L (ref 39–117)
Bilirubin, Direct: 0.1 mg/dL (ref 0.0–0.3)
Total Protein: 7 g/dL (ref 6.0–8.3)

## 2012-12-25 LAB — CBC
HCT: 41 % (ref 36.0–46.0)
Hemoglobin: 13.8 g/dL (ref 12.0–15.0)
MCV: 93.5 fl (ref 78.0–100.0)
Platelets: 291 10*3/uL (ref 150.0–400.0)
RBC: 4.39 Mil/uL (ref 3.87–5.11)
RDW: 13.7 % (ref 11.5–14.6)

## 2012-12-25 LAB — HEMOGLOBIN A1C: Hgb A1c MFr Bld: 5.1 % (ref 4.6–6.5)

## 2012-12-25 LAB — LIPID PANEL
Cholesterol: 170 mg/dL (ref 0–200)
HDL: 41.9 mg/dL (ref >39.00)
LDL Cholesterol: 106 mg/dL — ABNORMAL HIGH (ref 0–99)
Total CHOL/HDL Ratio: 4

## 2012-12-25 LAB — RENAL FUNCTION PANEL
Albumin: 4.2 g/dL (ref 3.5–5.2)
Calcium: 8.9 mg/dL (ref 8.4–10.5)
Chloride: 101 mEq/L (ref 96–112)
Creatinine, Ser: 0.8 mg/dL (ref 0.4–1.2)
Glucose, Bld: 78 mg/dL (ref 70–99)
Sodium: 136 mEq/L (ref 135–145)

## 2012-12-25 LAB — TSH: TSH: 2.27 u[IU]/mL (ref 0.35–5.50)

## 2012-12-25 MED ORDER — AZELASTINE HCL 0.1 % NA SOLN: 1.0000 | Freq: Two times a day (BID) | NASAL | Status: DC

## 2012-12-25 MED ORDER — FLUTICASONE PROPIONATE 50 MCG/ACT NA SUSP: NASAL | Status: DC

## 2012-12-25 NOTE — Patient Instructions (Signed)
Start flonase & nasal antihistamine daily. Also use sinus rinse daily. Pseudoephedrine will help dry up mucous-Take 24 hr or 12 hr tabs for 5-6 days. I will see you in 2-3 weeks for complete physical and for re-eval of sinuses. I will discuss blood work as well. Great to meet you! Have fun at the beach.  Sinusitis Sinusitis is redness, soreness, and swelling (inflammation) of the paranasal sinuses. Paranasal sinuses are air pockets within the bones of your face (beneath the eyes, the middle of the forehead, or above the eyes). In healthy paranasal sinuses, mucus is able to drain out, and air is able to circulate through them by way of your nose. However, when your paranasal sinuses are inflamed, mucus and air can become trapped. This can allow bacteria and other germs to grow and cause infection. Sinusitis can develop quickly and last only a short time (acute) or continue over a long period (chronic). Sinusitis that lasts for more than 12 weeks is considered chronic.  CAUSES  Causes of sinusitis include:  Allergies.  Structural abnormalities, such as displacement of the cartilage that separates your nostrils (deviated septum), which can decrease the air flow through your nose and sinuses and affect sinus drainage.  Functional abnormalities, such as when the small hairs (cilia) that line your sinuses and help remove mucus do not work properly or are not present. SYMPTOMS  Symptoms of acute and chronic sinusitis are the same. The primary symptoms are pain and pressure around the affected sinuses. Other symptoms include:  Upper toothache.  Earache.  Headache.  Bad breath.  Decreased sense of smell and taste.  A cough, which worsens when you are lying flat.  Fatigue.  Fever.  Thick drainage from your nose, which often is green and may contain pus (purulent).  Swelling and warmth over the affected sinuses. DIAGNOSIS  Your caregiver will perform a physical exam. During the exam, your  caregiver may:  Look in your nose for signs of abnormal growths in your nostrils (nasal polyps).  Tap over the affected sinus to check for signs of infection.  View the inside of your sinuses (endoscopy) with a special imaging device with a light attached (endoscope), which is inserted into your sinuses. If your caregiver suspects that you have chronic sinusitis, one or more of the following tests may be recommended:  Allergy tests.  Nasal culture A sample of mucus is taken from your nose and sent to a lab and screened for bacteria.  Nasal cytology A sample of mucus is taken from your nose and examined by your caregiver to determine if your sinusitis is related to an allergy. TREATMENT  Most cases of acute sinusitis are related to a viral infection and will resolve on their own within 10 days. Sometimes medicines are prescribed to help relieve symptoms (pain medicine, decongestants, nasal steroid sprays, or saline sprays).  However, for sinusitis related to a bacterial infection, your caregiver will prescribe antibiotic medicines. These are medicines that will help kill the bacteria causing the infection.  Rarely, sinusitis is caused by a fungal infection. In theses cases, your caregiver will prescribe antifungal medicine. For some cases of chronic sinusitis, surgery is needed. Generally, these are cases in which sinusitis recurs more than 3 times per year, despite other treatments. HOME CARE INSTRUCTIONS   Drink plenty of water. Water helps thin the mucus so your sinuses can drain more easily.  Use a humidifier.  Inhale steam 3 to 4 times a day (for example, sit in the  bathroom with the shower running).  Apply a warm, moist washcloth to your face 3 to 4 times a day, or as directed by your caregiver.  Use saline nasal sprays to help moisten and clean your sinuses.  Take over-the-counter or prescription medicines for pain, discomfort, or fever only as directed by your caregiver. SEEK  IMMEDIATE MEDICAL CARE IF:  You have increasing pain or severe headaches.  You have nausea, vomiting, or drowsiness.  You have swelling around your face.  You have vision problems.  You have a stiff neck.  You have difficulty breathing. MAKE SURE YOU:   Understand these instructions.  Will watch your condition.  Will get help right away if you are not doing well or get worse. Document Released: 05/02/2005 Document Revised: 07/25/2011 Document Reviewed: 05/17/2011 [facility] Patient Information 2014 [place], [place].

## 2012-12-25 NOTE — Progress Notes (Signed)
Subjective:     Sydney Vaughn is a 37 y.o. female who presents for evaluation of sinus pain, she also has relocated and will establish care at this office. Symptoms include: headaches, post nasal drip, sore throat and ear fullness. Onset of symptoms was 5 days ago. Symptoms have been unchanged since that time. Past history is significant for positive allergy testing for dust, ragweed, and other substances, recurrent sinusitis. Patient is a non-smoker. She also is concerned that she has gained 15 pounds in last year although activity level and diet have not changed. She wishes to shedule a complete physical in near future. Will do blood work today & discuss at return visit.   The following portions of the patient's history were reviewed and updated as appropriate: allergies, current medications, past medical history and problem list.  Review of Systems Constitutional: negative for chills, fatigue, fevers, night sweats and weight loss Eyes: negative for irritation, redness and visual disturbance Ears, nose, mouth, throat, and face: positive for sore throat and post-nasal drip, ear fillness Respiratory: negative for cough, pleurisy/chest pain, sputum and wheezing Cardiovascular: negative for chest pain, fatigue, irregular heart beat, near-syncope and palpitations Gastrointestinal: negative for nausea Integument/breast: positive for breast lump Neurological: positive for headaches and vertigo Endocrine: positive for unexplained weight gain Allergic/Immunologic: positive for allergies to multiple environmental substances   Objective:    BP 110/70  Pulse 82  Temp(Src) 98 F (36.7 C) (Oral)  Ht 5' 3.25" (1.607 m)  Wt 179 lb 8 oz (81.421 kg)  BMI 31.53 kg/m2  SpO2 97%  LMP 12/16/2012  General Appearance:    Alert, cooperative, no distress, appears stated age  Head:    Normocephalic, without obvious abnormality, atraumatic  Eyes:   conjunctiva/corneas clear, lids & lashes clear   Ears:     Effusion & retracted TM L, Dark cerumen occluding R TM, both canals normal  Nose:   Nares normal, septum midline, L turbinates enlarged & erythematous-perhaps polyp. R turbinates normal  Throat:   Lips, mucosa, and tongue normal; teeth and gums normal  Neck:   Supple, symmetrical, trachea midline, no adenopathy;    thyroid:  no enlargement/tenderness/nodules;      Lungs:     Clear to auscultation bilaterally, respirations unlabored      Heart:    Regular rate and rhythm, S1 and S2 normal, no murmur, rub   or gallop   PSYCH:  Alert, oriented X3, conversation appropriate, cooperative, congenial                    Lymph nodes:   Cervical, supraclavicular, and axillary nodes normal         Assessment:  1  Acute allergic sinusitis 2 weight gain 3 preventive care.    Plan:   1  Nasal steroids per medication orders. Antihistamines per medication orders. Follow up in 3 weeks or as needed.  CBC, Start daily sinus rinses. 2 TSH, lipids, HgA1C 3 renal, hepatic, Vit D, urinalysis, CPE next visit, history, review immunizations

## 2012-12-26 ENCOUNTER — Telehealth: Payer: Self-pay | Admitting: Nurse Practitioner

## 2012-12-26 ENCOUNTER — Ambulatory Visit: Payer: Federal, State, Local not specified - PPO

## 2012-12-26 ENCOUNTER — Encounter: Payer: Self-pay | Admitting: Family Medicine

## 2012-12-26 NOTE — Telephone Encounter (Signed)
Letter faxed to patient's work.

## 2012-12-26 NOTE — Telephone Encounter (Signed)
Labs: Kidney & renal func normal, no diabetes, blood count normal, TSH normal, lipids good vit D low-will recommend supplement phosphorus mildly elevated/calcium low normal-pt does not use phosphorus, Not likely Vit D, hypoPTH? Low Mg? Will add Mg level to blood drawn yesterday, unable to add PTH.   urinalysis not performed-pt unable to urinate-will order when returns for CPE.

## 2012-12-27 ENCOUNTER — Telehealth: Payer: Self-pay | Admitting: Nurse Practitioner

## 2012-12-27 NOTE — Telephone Encounter (Signed)
Called patient with results. Patient expressed concerns with why she has low Vit D levels. Patient requested results and instructions be released on to MyChart so she can look over them.

## 2013-01-15 ENCOUNTER — Encounter: Payer: Federal, State, Local not specified - PPO | Admitting: Nurse Practitioner

## 2013-01-22 NOTE — Telephone Encounter (Signed)
Opened in error

## 2013-01-28 ENCOUNTER — Encounter: Payer: Federal, State, Local not specified - PPO | Admitting: Nurse Practitioner

## 2013-03-21 ENCOUNTER — Other Ambulatory Visit: Payer: Self-pay

## 2013-05-24 ENCOUNTER — Ambulatory Visit (INDEPENDENT_AMBULATORY_CARE_PROVIDER_SITE_OTHER): Payer: Federal, State, Local not specified - PPO | Admitting: Nurse Practitioner

## 2013-05-24 ENCOUNTER — Encounter: Payer: Self-pay | Admitting: Nurse Practitioner

## 2013-05-24 VITALS — BP 107/71 | HR 87 | Temp 98.4°F | Resp 16 | Ht 63.25 in | Wt 181.0 lb

## 2013-05-24 DIAGNOSIS — Z0000 Encounter for general adult medical examination without abnormal findings: Secondary | ICD-10-CM

## 2013-05-24 DIAGNOSIS — M791 Myalgia, unspecified site: Secondary | ICD-10-CM

## 2013-05-24 DIAGNOSIS — G479 Sleep disorder, unspecified: Secondary | ICD-10-CM

## 2013-05-24 DIAGNOSIS — IMO0001 Reserved for inherently not codable concepts without codable children: Secondary | ICD-10-CM

## 2013-05-24 DIAGNOSIS — R5381 Other malaise: Secondary | ICD-10-CM

## 2013-05-24 DIAGNOSIS — R5383 Other fatigue: Secondary | ICD-10-CM

## 2013-05-24 DIAGNOSIS — Z6831 Body mass index (BMI) 31.0-31.9, adult: Secondary | ICD-10-CM

## 2013-05-24 DIAGNOSIS — H539 Unspecified visual disturbance: Secondary | ICD-10-CM

## 2013-05-24 NOTE — Progress Notes (Signed)
Pre visit review using our clinic review tool, if applicable. No additional management support is needed unless otherwise documented below in the visit note. 

## 2013-05-24 NOTE — Patient Instructions (Addendum)
For weight loss, limit calories to 1500/day. Choose nutrient dense foods like nuts, seeds, fruits vegetables & lean meats. Exercise goal is 30 minutes daily. You should lose 2-4 pounds weekly. Get an eye exam. Start an aspirin daily: 81 mg enteric coated. Start probiotic daily Align or culterelle. Sleep study may reveal reasons for fatigue. I want to see you in 2 mos to see how weight loss is going.  Preventive Care for Adults, Female A healthy lifestyle and preventive care can promote health and wellness. Preventive health guidelines for women include the following key practices.  A routine yearly physical is a good way to check with your caregiver about your health and preventive screening. It is a chance to share any concerns and updates on your health, and to receive a thorough exam.  Visit your dentist for a routine exam and preventive care every 6 months. Brush your teeth twice a day and floss once a day. Good oral hygiene prevents tooth decay and gum disease.  The frequency of eye exams is based on your age, health, family medical history, use of contact lenses, and other factors. Follow your caregiver's recommendations for frequency of eye exams.  Eat a healthy diet. Foods like vegetables, fruits, whole grains, low-fat dairy products, and lean protein foods contain the nutrients you need without too many calories. Decrease your intake of foods high in solid fats, added sugars, and salt. Eat the right amount of calories for you.Get information about a proper diet from your caregiver, if necessary.  Regular physical exercise is one of the most important things you can do for your health. Most adults should get at least 150 minutes of moderate-intensity exercise (any activity that increases your heart rate and causes you to sweat) each week. In addition, most adults need muscle-strengthening exercises on 2 or more days a week.  Maintain a healthy weight. The body mass index (BMI) is a screening  tool to identify possible weight problems. It provides an estimate of body fat based on height and weight. Your caregiver can help determine your BMI, and can help you achieve or maintain a healthy weight.For adults 20 years and older:  A BMI below 18.5 is considered underweight.  A BMI of 18.5 to 24.9 is normal.  A BMI of 25 to 29.9 is considered overweight.  A BMI of 30 and above is considered obese.  Maintain normal blood lipids and cholesterol levels by exercising and minimizing your intake of saturated fat. Eat a balanced diet with plenty of fruit and vegetables. Blood tests for lipids and cholesterol should begin at age 10 and be repeated every 5 years. If your lipid or cholesterol levels are high, you are over 50, or you are at high risk for heart disease, you may need your cholesterol levels checked more frequently.Ongoing high lipid and cholesterol levels should be treated with medicines if diet and exercise are not effective.  If you smoke, find out from your caregiver how to quit. If you do not use tobacco, do not start.  Lung cancer screening is recommended for adults aged 40 80 years who are at high risk for developing lung cancer because of a history of smoking. Yearly low-dose computed tomography (CT) is recommended for people who have at least a 30-pack-year history of smoking and are a current smoker or have quit within the past 15 years. A pack year of smoking is smoking an average of 1 pack of cigarettes a day for 1 year (for example: 1 pack  a day for 30 years or 2 packs a day for 15 years). Yearly screening should continue until the smoker has stopped smoking for at least 15 years. Yearly screening should also be stopped for people who develop a health problem that would prevent them from having lung cancer treatment.  If you are pregnant, do not drink alcohol. If you are breastfeeding, be very cautious about drinking alcohol. If you are not pregnant and choose to drink alcohol,  do not exceed 1 drink per day. One drink is considered to be 12 ounces (355 mL) of beer, 5 ounces (148 mL) of wine, or 1.5 ounces (44 mL) of liquor.  Avoid use of street drugs. Do not share needles with anyone. Ask for help if you need support or instructions about stopping the use of drugs.  High blood pressure causes heart disease and increases the risk of stroke. Your blood pressure should be checked at least every 1 to 2 years. Ongoing high blood pressure should be treated with medicines if weight loss and exercise are not effective.  If you are 30 to 40 years old, ask your caregiver if you should take aspirin to prevent strokes.  Diabetes screening involves taking a blood sample to check your fasting blood sugar level. This should be done once every 3 years, after age 5, if you are within normal weight and without risk factors for diabetes. Testing should be considered at a younger age or be carried out more frequently if you are overweight and have at least 1 risk factor for diabetes.  Breast cancer screening is essential preventive care for women. You should practice "breast self-awareness." This means understanding the normal appearance and feel of your breasts and may include breast self-examination. Any changes detected, no matter how small, should be reported to a caregiver. Women in their 78s and 30s should have a clinical breast exam (CBE) by a caregiver as part of a regular health exam every 1 to 3 years. After age 33, women should have a CBE every year. Starting at age 13, women should consider having a mammography (breast X-ray test) every year. Women who have a family history of breast cancer should talk to their caregiver about genetic screening. Women at a high risk of breast cancer should talk to their caregivers about having magnetic resonance imaging (MRI) and a mammography every year.  Breast cancer gene (BRCA)-related cancer risk assessment is recommended for women who have  family members with BRCA-related cancers. BRCA-related cancers include breast, ovarian, tubal, and peritoneal cancers. Having family members with these cancers may be associated with an increased risk for harmful changes (mutations) in the breast cancer genes BRCA1 and BRCA2. Results of the assessment will determine the need for genetic counseling and BRCA1 and BRCA2 testing.  The Pap test is a screening test for cervical cancer. A Pap test can show cell changes on the cervix that might become cervical cancer if left untreated. A Pap test is a procedure in which cells are obtained and examined from the lower end of the uterus (cervix).  Women should have a Pap test starting at age 4.  Between ages 45 and 79, Pap tests should be repeated every 2 years.  Beginning at age 43, you should have a Pap test every 3 years as long as the past 3 Pap tests have been normal.  Some women have medical problems that increase the chance of getting cervical cancer. Talk to your caregiver about these problems. It is especially important  to talk to your caregiver if a new problem develops soon after your last Pap test. In these cases, your caregiver may recommend more frequent screening and Pap tests.  The above recommendations are the same for women who have or have not gotten the vaccine for human papillomavirus (HPV).  If you had a hysterectomy for a problem that was not cancer or a condition that could lead to cancer, then you no longer need Pap tests. Even if you no longer need a Pap test, a regular exam is a good idea to make sure no other problems are starting.  If you are between ages 58 and 52, and you have had normal Pap tests going back 10 years, you no longer need Pap tests. Even if you no longer need a Pap test, a regular exam is a good idea to make sure no other problems are starting.  If you have had past treatment for cervical cancer or a condition that could lead to cancer, you need Pap tests and  screening for cancer for at least 20 years after your treatment.  If Pap tests have been discontinued, risk factors (such as a new sexual partner) need to be reassessed to determine if screening should be resumed.  The HPV test is an additional test that may be used for cervical cancer screening. The HPV test looks for the virus that can cause the cell changes on the cervix. The cells collected during the Pap test can be tested for HPV. The HPV test could be used to screen women aged 76 years and older, and should be used in women of any age who have unclear Pap test results. After the age of 15, women should have HPV testing at the same frequency as a Pap test.  Colorectal cancer can be detected and often prevented. Most routine colorectal cancer screening begins at the age of 68 and continues through age 3. However, your caregiver may recommend screening at an earlier age if you have risk factors for colon cancer. On a yearly basis, your caregiver may provide home test kits to check for hidden blood in the stool. Use of a small camera at the end of a tube, to directly examine the colon (sigmoidoscopy or colonoscopy), can detect the earliest forms of colorectal cancer. Talk to your caregiver about this at age 44, when routine screening begins. Direct examination of the colon should be repeated every 5 to 10 years through age 68, unless early forms of pre-cancerous polyps or small growths are found.  Hepatitis C blood testing is recommended for all people born from [birth year] through 1965 and any individual with known risks for hepatitis C.  Practice safe sex. Use condoms and avoid high-risk sexual practices to reduce the spread of sexually transmitted infections (STIs). STIs include gonorrhea, chlamydia, syphilis, trichomonas, herpes, HPV, and human immunodeficiency virus (HIV). Herpes, HIV, and HPV are viral illnesses that have no cure. They can result in disability, cancer, and death. Sexually active women  aged 29 and younger should be checked for chlamydia. Older women with new or multiple partners should also be tested for chlamydia. Testing for other STIs is recommended if you are sexually active and at increased risk.  Osteoporosis is a disease in which the bones lose minerals and strength with aging. This can result in serious bone fractures. The risk of osteoporosis can be identified using a bone density scan. Women ages 56 and over and women at risk for fractures or osteoporosis should discuss screening  with their caregivers. Ask your caregiver whether you should take a calcium supplement or vitamin D to reduce the rate of osteoporosis.  Menopause can be associated with physical symptoms and risks. Hormone replacement therapy is available to decrease symptoms and risks. You should talk to your caregiver about whether hormone replacement therapy is right for you.  Use sunscreen. Apply sunscreen liberally and repeatedly throughout the day. You should seek shade when your shadow is shorter than you. Protect yourself by wearing long sleeves, pants, a wide-brimmed hat, and sunglasses year round, whenever you are outdoors.  Once a month, do a whole body skin exam, using a mirror to look at the skin on your back. Notify your caregiver of new moles, moles that have irregular borders, moles that are larger than a pencil eraser, or moles that have changed in shape or color.  Stay current with required immunizations.  Influenza vaccine. All adults should be immunized every year.  Tetanus, diphtheria, and acellular pertussis (Td, Tdap) vaccine. Pregnant women should receive 1 dose of Tdap vaccine during each pregnancy. The dose should be obtained regardless of the length of time since the last dose. Immunization is preferred during the 27th to 36th week of gestation. An adult who has not previously received Tdap or who does not know her vaccine status should receive 1 dose of Tdap. This initial dose should be  followed by tetanus and diphtheria toxoids (Td) booster doses every 10 years. Adults with an unknown or incomplete history of completing a 3-dose immunization series with Td-containing vaccines should begin or complete a primary immunization series including a Tdap dose. Adults should receive a Td booster every 10 years.  Varicella vaccine. An adult without evidence of immunity to varicella should receive 2 doses or a second dose if she has previously received 1 dose. Pregnant females who do not have evidence of immunity should receive the first dose after pregnancy. This first dose should be obtained before leaving the health care facility. The second dose should be obtained 4 8 weeks after the first dose.  Human papillomavirus (HPV) vaccine. Females aged 59 26 years who have not received the vaccine previously should obtain the 3-dose series. The vaccine is not recommended for use in pregnant females. However, pregnancy testing is not needed before receiving a dose. If a female is found to be pregnant after receiving a dose, no treatment is needed. In that case, the remaining doses should be delayed until after the pregnancy. Immunization is recommended for any person with an immunocompromised condition through the age of 32 years if she did not get any or all doses earlier. During the 3-dose series, the second dose should be obtained 4 8 weeks after the first dose. The third dose should be obtained 24 weeks after the first dose and 16 weeks after the second dose.  Zoster vaccine. One dose is recommended for adults aged 87 years or older unless certain conditions are present.  Measles, mumps, and rubella (MMR) vaccine. Adults born before [birth year] generally are considered immune to measles and mumps. Adults born in [birth year] or later should have 1 or more doses of MMR vaccine unless there is a contraindication to the vaccine or there is laboratory evidence of immunity to each of the three diseases. A routine second  dose of MMR vaccine should be obtained at least 28 days after the first dose for students attending postsecondary schools, health care workers, or international travelers. People who received inactivated measles vaccine or an unknown type  of measles vaccine during 1963 1967 should receive 2 doses of MMR vaccine. People who received inactivated mumps vaccine or an unknown type of mumps vaccine before 1979 and are at high risk for mumps infection should consider immunization with 2 doses of MMR vaccine. For females of childbearing age, rubella immunity should be determined. If there is no evidence of immunity, females who are not pregnant should be vaccinated. If there is no evidence of immunity, females who are pregnant should delay immunization until after pregnancy. Unvaccinated health care workers born before [birth year] who lack laboratory evidence of measles, mumps, or rubella immunity or laboratory confirmation of disease should consider measles and mumps immunization with 2 doses of MMR vaccine or rubella immunization with 1 dose of MMR vaccine.  Pneumococcal 13-valent conjugate (PCV13) vaccine. When indicated, a person who is uncertain of her immunization history and has no record of immunization should receive the PCV13 vaccine. An adult aged 3 years or older who has certain medical conditions and has not been previously immunized should receive 1 dose of PCV13 vaccine. This PCV13 should be followed with a dose of pneumococcal polysaccharide (PPSV23) vaccine. The PPSV23 vaccine dose should be obtained at least 8 weeks after the dose of PCV13 vaccine. An adult aged 55 years or older who has certain medical conditions and previously received 1 or more doses of PPSV23 vaccine should receive 1 dose of PCV13. The PCV13 vaccine dose should be obtained 1 or more years after the last PPSV23 vaccine dose.  Pneumococcal polysaccharide (PPSV23) vaccine. When PCV13 is also indicated, PCV13 should be obtained first. All  adults aged 76 years and older should be immunized. An adult younger than age 58 years who has certain medical conditions should be immunized. Any person who resides in a nursing home or long-term care facility should be immunized. An adult smoker should be immunized. People with an immunocompromised condition and certain other conditions should receive both PCV13 and PPSV23 vaccines. People with human immunodeficiency virus (HIV) infection should be immunized as soon as possible after diagnosis. Immunization during chemotherapy or radiation therapy should be avoided. Routine use of PPSV23 vaccine is not recommended for American Indians, [place] Natives, or people younger than 65 years unless there are medical conditions that require PPSV23 vaccine. When indicated, people who have unknown immunization and have no record of immunization should receive PPSV23 vaccine. One-time revaccination 5 years after the first dose of PPSV23 is recommended for people aged 57 64 years who have chronic kidney failure, nephrotic syndrome, asplenia, or immunocompromised conditions. People who received 1 2 doses of PPSV23 before age 54 years should receive another dose of PPSV23 vaccine at age 69 years or later if at least 5 years have passed since the previous dose. Doses of PPSV23 are not needed for people immunized with PPSV23 at or after age 5 years.  Meningococcal vaccine. Adults with asplenia or persistent complement component deficiencies should receive 2 doses of quadrivalent meningococcal conjugate (MenACWY-D) vaccine. The doses should be obtained at least 2 months apart. Microbiologists working with certain meningococcal bacteria, [place] recruits, people at risk during an outbreak, and people who travel to or live in countries with a high rate of meningitis should be immunized. A first-year college student up through age 24 years who is living in a residence hall should receive a dose if she did not receive a dose on or  after her 16th birthday. Adults who have certain high-risk conditions should receive one or more doses of vaccine.  Hepatitis A vaccine. Adults who wish to be protected from this disease, have certain high-risk conditions, work with hepatitis A-infected animals, work in hepatitis A research labs, or travel to or work in countries with a high rate of hepatitis A should be immunized. Adults who were previously unvaccinated and who anticipate close contact with an international adoptee during the first 60 days after arrival in the [place] from a country with a high rate of hepatitis A should be immunized.  Hepatitis B vaccine. Adults who wish to be protected from this disease, have certain high-risk conditions, may be exposed to blood or other infectious body fluids, are household contacts or sex partners of hepatitis B positive people, are clients or workers in certain care facilities, or travel to or work in countries with a high rate of hepatitis B should be immunized.  Haemophilus influenzae type b (Hib) vaccine. A previously unvaccinated person with asplenia or sickle cell disease or having a scheduled splenectomy should receive 1 dose of Hib vaccine. Regardless of previous immunization, a recipient of a hematopoietic stem cell transplant should receive a 3-dose series 6 12 months after her successful transplant. Hib vaccine is not recommended for adults with HIV infection. Preventive Services / Frequency Ages 7 to 2  Blood pressure check.** / Every 1 to 2 years.  Lipid and cholesterol check.** / Every 5 years beginning at age 21.  Clinical breast exam.** / Every 3 years for women in their 27s and 79s.  BRCA-related cancer risk assessment.** / For women who have family members with a BRCA-related cancer (breast, ovarian, tubal, or peritoneal cancers).  Pap test.** / Every 2 years from ages 66 through 13. Every 3 years starting at age 36 through age 74 or 55 with a history of 3 consecutive  normal Pap tests.  HPV screening.** / Every 3 years from ages 84 through ages 60 to 72 with a history of 3 consecutive normal Pap tests.  Hepatitis C blood test.** / For any individual with known risks for hepatitis C.  Skin self-exam. / Monthly.  Influenza vaccine. / Every year.  Tetanus, diphtheria, and acellular pertussis (Tdap, Td) vaccine.** / Consult your caregiver. Pregnant women should receive 1 dose of Tdap vaccine during each pregnancy. 1 dose of Td every 10 years.  Varicella vaccine.** / Consult your caregiver. Pregnant females who do not have evidence of immunity should receive the first dose after pregnancy.  HPV vaccine. / 3 doses over 6 months, if 41 and younger. The vaccine is not recommended for use in pregnant females. However, pregnancy testing is not needed before receiving a dose.  Measles, mumps, rubella (MMR) vaccine.** / You need at least 1 dose of MMR if you were born in 1957 or later. You may also need a 2nd dose. For females of childbearing age, rubella immunity should be determined. If there is no evidence of immunity, females who are not pregnant should be vaccinated. If there is no evidence of immunity, females who are pregnant should delay immunization until after pregnancy.  Pneumococcal 13-valent conjugate (PCV13) vaccine.** / Consult your caregiver.  Pneumococcal polysaccharide (PPSV23) vaccine.** / 1 to 2 doses if you smoke cigarettes or if you have certain conditions.  Meningococcal vaccine.** / 1 dose if you are age 14 to 71 years and a Market researcher living in a residence hall, or have one of several medical conditions, you need to get vaccinated against meningococcal disease. You may also need additional booster doses.  Hepatitis A vaccine.** /  Consult your caregiver.  Hepatitis B vaccine.** / Consult your caregiver.  Haemophilus influenzae type b (Hib) vaccine.** / Consult your caregiver. Ages 25 to 68  Blood pressure check.** / Every  1 to 2 years.  Lipid and cholesterol check.** / Every 5 years beginning at age 39.  Lung cancer screening. / Every year if you are aged 56 80 years and have a 30-pack-year history of smoking and currently smoke or have quit within the past 15 years. Yearly screening is stopped once you have quit smoking for at least 15 years or develop a health problem that would prevent you from having lung cancer treatment.  Clinical breast exam.** / Every year after age 79.  BRCA-related cancer risk assessment.** / For women who have family members with a BRCA-related cancer (breast, ovarian, tubal, or peritoneal cancers).  Mammogram.** / Every year beginning at age 61 and continuing for as long as you are in good health. Consult with your caregiver.  Pap test.** / Every 3 years starting at age 39 through age 51 or 29 with a history of 3 consecutive normal Pap tests.  HPV screening.** / Every 3 years from ages 77 through ages 36 to 50 with a history of 3 consecutive normal Pap tests.  Fecal occult blood test (FOBT) of stool. / Every year beginning at age 64 and continuing until age 54. You may not need to do this test if you get a colonoscopy every 10 years.  Flexible sigmoidoscopy or colonoscopy.** / Every 5 years for a flexible sigmoidoscopy or every 10 years for a colonoscopy beginning at age 18 and continuing until age 2.  Hepatitis C blood test.** / For all people born from [birth year] through 1967 and any individual with known risks for hepatitis C.  Skin self-exam. / Monthly.  Influenza vaccine. / Every year.  Tetanus, diphtheria, and acellular pertussis (Tdap/Td) vaccine.** / Consult your caregiver. Pregnant women should receive 1 dose of Tdap vaccine during each pregnancy. 1 dose of Td every 10 years.  Varicella vaccine.** / Consult your caregiver. Pregnant females who do not have evidence of immunity should receive the first dose after pregnancy.  Zoster vaccine.** / 1 dose for adults aged 23  years or older.  Measles, mumps, rubella (MMR) vaccine.** / You need at least 1 dose of MMR if you were born in 1957 or later. You may also need a 2nd dose. For females of childbearing age, rubella immunity should be determined. If there is no evidence of immunity, females who are not pregnant should be vaccinated. If there is no evidence of immunity, females who are pregnant should delay immunization until after pregnancy.  Pneumococcal 13-valent conjugate (PCV13) vaccine.** / Consult your caregiver.  Pneumococcal polysaccharide (PPSV23) vaccine.** / 1 to 2 doses if you smoke cigarettes or if you have certain conditions.  Meningococcal vaccine.** / Consult your caregiver.  Hepatitis A vaccine.** / Consult your caregiver.  Hepatitis B vaccine.** / Consult your caregiver.  Haemophilus influenzae type b (Hib) vaccine.** / Consult your caregiver. ** Family history and personal history of risk and conditions may change your caregiver's recommendations. Document Released: 06/28/2001 Document Revised: 08/27/2012 Document Reviewed: 09/27/2010 [facility] Patient Information 2014 [place], [place].

## 2013-05-26 ENCOUNTER — Encounter: Payer: Self-pay | Admitting: Nurse Practitioner

## 2013-05-26 DIAGNOSIS — Z6831 Body mass index (BMI) 31.0-31.9, adult: Secondary | ICD-10-CM | POA: Insufficient documentation

## 2013-05-26 DIAGNOSIS — M7918 Myalgia, other site: Secondary | ICD-10-CM | POA: Insufficient documentation

## 2013-05-26 DIAGNOSIS — G4710 Hypersomnia, unspecified: Secondary | ICD-10-CM | POA: Insufficient documentation

## 2013-05-26 DIAGNOSIS — R5381 Other malaise: Secondary | ICD-10-CM | POA: Insufficient documentation

## 2013-05-26 DIAGNOSIS — H539 Unspecified visual disturbance: Secondary | ICD-10-CM | POA: Insufficient documentation

## 2013-05-26 DIAGNOSIS — R5383 Other fatigue: Secondary | ICD-10-CM

## 2013-05-26 NOTE — Progress Notes (Signed)
Subjective:     Sydney Vaughn is a 38 y.o. female and is here for a comprehensive physical exam. She has been receiving gynecological care at [facility]: last pap was 2013, nml.; reports 1 abnml pap 10 ya, all nml since that time. She wishes to receive pap screening in our office in the future. The patient reports problems - "feel tired all the time", not sleeping well due to frequent wakings-thinks she wakes due to feeling like can't breathe; anxious feeling at end of day; "feel old in mornings-whole body hurts"; & frequent sinusitis.  History   Social History  . Marital Status: Married    Spouse Name: N/A    Number of Children: 4  . Years of Education: N/A   Occupational History  .      administration   Social History Main Topics  . Smoking status: Never Smoker   . Smokeless tobacco: Not on file  . Alcohol Use: Not on file  . Drug Use: Not on file  . Sexual Activity: Yes    Birth Control/ Protection: Surgical   Other Topics Concern  . Not on file   Social History Narrative   Sydney Vaughn lives in [place] with her husband and children. She has 4 daughters: ages ranging from 71 yo- 56 yo. 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     Health Maintenance  Topic Date Due  . Influenza Vaccine  12/14/2013  . Pap Smear  05/26/2014  . Tetanus/tdap  05/16/2017    The following portions of the patient's history were reviewed and updated as appropriate: allergies, current medications, past family history, past medical history, past social history, past surgical history and problem list.  Review of Systems Constitutional: positive for fatigue and weight gain, negative for fevers and night sweats Eyes: positive for visual disturbance and describes seeing "zigzag lines" w/decreased peripheral vision, negative for contacts/glasses and redness Ears, nose, mouth, throat, and face: positive for nasal congestion and feels like can never  breath through nose, negative for hoarseness, sore throat and tinnitus Respiratory: negative for cough, wheezing and positive for childhood asthma Cardiovascular: negative for chest pain, chest pressure/discomfort, irregular heart beat, lower extremity edema and near-syncope Gastrointestinal: negative for abdominal pain, change in bowel habits, constipation, diarrhea and dyspepsia Genitourinary:negative, mc varies q 28-30 d, mod amt blood, about 5 d Integument/breast: positive for adult acne Musculoskeletal:positive for myalgias and worse in am, negative for arthralgias, muscle weakness and stiff joints Neurological: positive for dizziness and headaches, negative for coordination problems, gait problems, memory problems, paresthesia, seizures, tremors and weakness Behavioral/Psych: positive for anxiety and sleep disturbance, negative for bad mood, decreased appetite, depression, excessive alcohol consumption, illegal drug usage and tobacco use Endocrine: positive for weight gain, negative for diabetic symptoms including polydipsia, polyphagia and polyuria and temperature intolerance Allergic/Immunologic: positive for seasonal allergies   Objective:    BP 107/71  Pulse 87  Temp(Src) 98.4 F (36.9 C) (Temporal)  Resp 16  Ht 5' 3.25" (1.607 m)  Wt 181 lb (82.101 kg)  BMI 31.79 kg/m2  SpO2 98%  LMP 04/15/2013 General appearance: alert, cooperative, appears stated age and no distress Head: Normocephalic, without obvious abnormality, atraumatic Eyes: negative findings: lids and lashes normal, conjunctivae and sclerae normal, corneas clear, pupils equal, round, reactive to light and accomodation and visual fields full to confrontation Ears: normal TM's and external ear canals both ears Nose: Nares normal. Septum midline. Mucosa normal. No  drainage or sinus tenderness. Throat: lips, mucosa, and tongue normal; teeth and gums normal Neck: no adenopathy, no carotid bruit, supple, symmetrical,  trachea midline and thyroid not enlarged, symmetric, no tenderness/mass/nodules Back: no kyphosis present, no skin lesions, erythema, or scars Lungs: clear to auscultation bilaterally Heart: regular rate and rhythm, S1, S2 normal, no murmur, click, rub or gallop Abdomen: soft, non-tender; bowel sounds normal; no masses,  no organomegaly Extremities: extremities normal, atraumatic, no cyanosis or edema Pulses: 2+ and symmetric Skin: Skin color, texture, turgor normal. No rashes or lesions or few papules on face at jaw line,o/w clear Lymph nodes: no cervical or supraclavicular LAD Neurologic: Alert and oriented X 3, normal strength and tone. Normal symmetric reflexes. Normal coordination and gait    Assessment:    1 Sleep difficulties-frequent wakenings "feeling like can't breathe, able to fall back to sleep DD: OSA, environmental, anxiety 2 Fatigue. CBC & Thyroid func nml 4 mos ago DD: sleep deprivation, chronic allergies, diet, inactivity, Autoimmune (AI)?-daughter has SLE  3 Myalgia-worse in am, denies stiff joints/joint pain TL:XBWI sleep quality, fibromyalgia, AI disease 4 Visual disturbance-describes "zigzag lines" w/loss of periph vision sometimes accompanied by HA. DD: scotoma, migraine variant 5 BMI 31   Plan:    1 pulm ref for sleep study, if no OSA or apnea, consider hypnotic sleep aid 2-3 Address sleep quality 1st, then repeat cbc, tsh, cmet, AI work-up if necessary (ANA by IFA, ESR, RF, C3, C4, CBC diff, renal func) 4 Start 81 mg asa ec qd-if migraine variant 2.5 fold increase for stroke, recmd opthal exam   5 30 minute walk daily, limit calories to 1500/d. Cut out sugar, fast food, & ETOH. See pt instructions. F/u 1 mo.

## 2013-06-27 ENCOUNTER — Encounter: Payer: Self-pay | Admitting: Pulmonary Disease

## 2013-06-27 ENCOUNTER — Ambulatory Visit (INDEPENDENT_AMBULATORY_CARE_PROVIDER_SITE_OTHER): Payer: Federal, State, Local not specified - PPO | Admitting: Pulmonary Disease

## 2013-06-27 VITALS — BP 116/68 | HR 79 | Ht 63.5 in | Wt 180.0 lb

## 2013-06-27 DIAGNOSIS — G4710 Hypersomnia, unspecified: Secondary | ICD-10-CM

## 2013-06-27 NOTE — Patient Instructions (Signed)
Proceed with sleep study Thyroid function is normal

## 2013-06-27 NOTE — Progress Notes (Signed)
Subjective:    Patient ID: Sydney Vaughn, female    DOB: [birth year], 38 y.o.   MRN: [id number]  HPI  Chief Complaint  Patient presents with  . Advice Only    Referred by Dr Kathlen Mody for sleep consult.  Pt c/o waking up gasping for air X6 mos.  Has not had a sleep study.     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 She reports "feel tired all the time", not sleeping well due to frequent wakings-thinks she wakes due to feeling like can't breathe; anxious feeling at end of day; "feel old in mornings-whole body hurts"; & frequent sinusitis. She reports waking up in panic mode from sleep as if she has forgotten to breathe. She reports excessive daytime tiredness. Epworth sleepiness score is 17/24. Bedtime is around midnight, sleep latency is about 30 minutes, she sleeps on her side or her stomach with one pillow ,  reports 4-5 awakenings and panic mode almost every night, is out of bed at 6:15 AM feeling tired and occasional headache that lasts for a few hours. On weekends she stays in bed up to 8 AM and feels better. She has gained about 20 pounds in the last 2 years She reports half cup of coffee in the mornings and 1 cup of tea  She works as an Scientist, physiological for [employer]    Past Medical History  Diagnosis Date  . Adult acne     Past Surgical History  Procedure Laterality Date  . Appendectomy    . Tubal ligation      No Known Allergies  History   Social History  . Marital Status: Married    Spouse Name: N/A    Number of Children: 4  . Years of Education: N/A   Occupational History  .      administration   Social History Main Topics  . Smoking status: Never Smoker   . Smokeless tobacco: Never Used  . Alcohol Use: No  . Drug Use: Not on file  . Sexual Activity: Yes    Birth Control/ Protection: Surgical   Other Topics Concern  . Not on file   Social History Narrative   Ms. Uriostegui lives in [place] with her  husband and children. She has 4 daughters: ages ranging from 18 yo- 16 yo. 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      Family History  Problem Relation Age of Onset  . Thyroid disease Mother   . Sinusitis Sister   . Sinusitis Brother   . Lupus Daughter     treated at [facility]  . Allergies Daughter   . Allergies Daughter   . Allergies Daughter   . Allergies Daughter     Review of Systems  Constitutional: Negative for fever and unexpected weight change.  HENT: Negative for congestion, dental problem, ear pain, nosebleeds, postnasal drip, rhinorrhea, sinus pressure, sneezing, sore throat and trouble swallowing.   Eyes: Negative for redness and itching.  Respiratory: Negative for cough, chest tightness, shortness of breath and wheezing.   Cardiovascular: Negative for palpitations and leg swelling.  Gastrointestinal: Negative for nausea and vomiting.  Genitourinary: Negative for dysuria.  Musculoskeletal: Negative for joint swelling.  Skin: Negative for rash.  Neurological: Negative for headaches.  Hematological: Does not bruise/bleed easily.  Psychiatric/Behavioral: Negative for dysphoric mood. The patient is not nervous/anxious.        Objective:  Physical Exam  Gen. Pleasant,  in no distress, normal affect ENT - no lesions, no post nasal drip, class 2 airway Neck: No JVD, no thyromegaly, no carotid bruits Lungs: no use of accessory muscles, no dullness to percussion, decreased without rales or rhonchi  Cardiovascular: Rhythm regular, heart sounds  normal, no murmurs or gallops, no peripheral edema Abdomen: soft and non-tender, no hepatosplenomegaly, BS normal. Musculoskeletal: No deformities, no cyanosis or clubbing Neuro:  alert, non focal, no tremors       Assessment & Plan:

## 2013-06-27 NOTE — Assessment & Plan Note (Signed)
Given excessive daytime somnolence, narrow pharyngeal exam, dyspnea waking her up from sleep obstructive sleep apnea is very likely & an overnight polysomnogram will be scheduled as a split study. The pathophysiology of obstructive sleep apnea , it's cardiovascular consequences & modes of treatment including CPAP were discused with the patient in detail & they evidenced understanding.  DD incl gen anxiety disorder, decreased sleep quantity

## 2013-07-31 ENCOUNTER — Encounter (HOSPITAL_BASED_OUTPATIENT_CLINIC_OR_DEPARTMENT_OTHER): Payer: Federal, State, Local not specified - PPO

## 2013-08-05 ENCOUNTER — Encounter (HOSPITAL_BASED_OUTPATIENT_CLINIC_OR_DEPARTMENT_OTHER): Payer: Federal, State, Local not specified - PPO

## 2013-08-07 ENCOUNTER — Ambulatory Visit (HOSPITAL_BASED_OUTPATIENT_CLINIC_OR_DEPARTMENT_OTHER): Payer: Federal, State, Local not specified - PPO | Attending: Pulmonary Disease | Admitting: Radiology

## 2013-08-07 VITALS — Ht 64.0 in | Wt 175.0 lb

## 2013-08-07 DIAGNOSIS — R404 Transient alteration of awareness: Secondary | ICD-10-CM | POA: Insufficient documentation

## 2013-08-07 DIAGNOSIS — G4710 Hypersomnia, unspecified: Secondary | ICD-10-CM

## 2013-08-07 DIAGNOSIS — R5381 Other malaise: Secondary | ICD-10-CM | POA: Insufficient documentation

## 2013-08-07 DIAGNOSIS — R5383 Other fatigue: Secondary | ICD-10-CM

## 2013-08-13 ENCOUNTER — Telehealth: Payer: Self-pay | Admitting: Pulmonary Disease

## 2013-08-13 DIAGNOSIS — G4710 Hypersomnia, unspecified: Secondary | ICD-10-CM

## 2013-08-13 NOTE — Telephone Encounter (Signed)
lmomtcb x1 for pt 

## 2013-08-13 NOTE — Telephone Encounter (Signed)
She does not have OSA. Sleep quality was normal. She can consider increasing quantity of sleep.

## 2013-08-13 NOTE — Sleep Study (Signed)
[place]   NAME: Sydney Vaughn  DATE OF BIRTH: 1976-03-25  MEDICAL RECORD [id number]  LOCATION: [facility]   PHYSICIAN: ALVA,RAKESH V.   DATE OF STUDY: 08/07/13   SLEEP STUDY TYPE: Nocturnal Polysomnogram   REFERRING PHYSICIAN: Rigoberto Noel, MD   INDICATION FOR STUDY: 38 year old for evaluation of excessive daytime somnolence and fatigue and not refreshing sleep with gasping episodes during sleep.   At the time of this study ,they weighed 175 pounds with a height of 5 ft 4 inches and the BMI of 30, neck size of 14 inches. Epworth sleepiness score was 15   This nocturnal polysomnogram was performed with a sleep technologist in attendance. EEG, EOG,EMG and respiratory parameters recorded. Sleep stages, arousals, limb movements and respiratory data was scored according to criteria laid out by the American Academy of sleep medicine.   SLEEP ARCHITECTURE: Lights out was at 22-25 PM and lights on was at 5:15 AM. Total sleep time was 286 minutes with a sleep period time of 333 minutes and a sleep efficiency of 70 %. Sleep latency was 71 minutes with latency to REM sleep of 179 minutes and wake after sleep onset of 53 minutes. . Sleep stages as a percentage of total sleep time was N1 -17 %,N2- 62 % and REM sleep 20 % ( 58 minutes) . The longest period of REM sleep was around 5 AM.   AROUSAL DATA : There were 72  arousals with an arousal index of 15 events per hour. Most of these were spontaneous & 0 were associated with respiratory events  RESPIRATORY DATA: There were 0 obstructive apneas, 0 central apneas, 0 mixed apneas and 3 hypopneas with apnea -hypopnea index of 0.6 events per hour. There were 16 RERAs with an RDI of 4 events per hour. There was no relation to sleep stage or body position. Supine sleep was noted  MOVEMENT/PARASOMNIA: There were 87 PLMS with a PLM index of 18 events per hour. The PLM arousal index was 0.2 per hour.  OXYGEN  DATA: The lowest desaturation was 91 % during REM sleep and the desaturation index was 0.6 per hour.   CARDIAC DATA: The low heart rate was 42 beats per minute. The high heart rate recorded was an artifact. No arrhythmias were noted  DISCUSSION -Loud snoring was noted . She did not meet criteria for CPAP intervention. She was desensitized with a medium full face mask  IMPRESSION :  1. No evidence of sleep disordered breathing 2. No evidence of cardiac arrhythmias,periodic limb movements or behavioral disturbance during sleep.  3. Sleep efficiency was decreased  RECOMMENDATION:  1. consider increasing quantity of sleep, rules of sleep hygiene can be discussed. 2. Patient should be cautioned against driving when sleepy  3. They should be asked to avoid medications with sedative side effects    ALVA,RAKESH V.  Diplomate, Tax adviser of Sleep Medicine    ELECTRONICALLY SIGNED ON: 08/13/2013  [facility]  PH: [phone] FX: [phone] [place]

## 2013-08-15 NOTE — Telephone Encounter (Signed)
lmtcb on pt's work voicemail per her request.

## 2013-08-15 NOTE — Telephone Encounter (Signed)
LMOMTCB x2 for pt

## 2013-08-15 NOTE — Telephone Encounter (Signed)
Pt is aware of results. 

## 2013-12-11 ENCOUNTER — Encounter: Payer: Self-pay | Admitting: Nurse Practitioner

## 2013-12-11 ENCOUNTER — Ambulatory Visit (INDEPENDENT_AMBULATORY_CARE_PROVIDER_SITE_OTHER): Payer: Federal, State, Local not specified - PPO | Admitting: Nurse Practitioner

## 2013-12-11 VITALS — BP 127/82 | HR 84 | Temp 97.6°F | Ht 63.25 in | Wt 182.0 lb

## 2013-12-11 DIAGNOSIS — Z6832 Body mass index (BMI) 32.0-32.9, adult: Secondary | ICD-10-CM | POA: Insufficient documentation

## 2013-12-11 DIAGNOSIS — M7072 Other bursitis of hip, left hip: Secondary | ICD-10-CM

## 2013-12-11 DIAGNOSIS — M76899 Other specified enthesopathies of unspecified lower limb, excluding foot: Secondary | ICD-10-CM

## 2013-12-11 DIAGNOSIS — M2550 Pain in unspecified joint: Secondary | ICD-10-CM | POA: Insufficient documentation

## 2013-12-11 DIAGNOSIS — M7071 Other bursitis of hip, right hip: Secondary | ICD-10-CM

## 2013-12-11 DIAGNOSIS — IMO0001 Reserved for inherently not codable concepts without codable children: Secondary | ICD-10-CM

## 2013-12-11 DIAGNOSIS — M791 Myalgia, unspecified site: Secondary | ICD-10-CM

## 2013-12-11 DIAGNOSIS — N9089 Other specified noninflammatory disorders of vulva and perineum: Secondary | ICD-10-CM

## 2013-12-11 LAB — URINALYSIS, ROUTINE W REFLEX MICROSCOPIC
BILIRUBIN URINE: NEGATIVE
Hgb urine dipstick: NEGATIVE
Ketones, ur: NEGATIVE
LEUKOCYTES UA: NEGATIVE
NITRITE: NEGATIVE
PH: 6.5 (ref 5.0–8.0)
SPECIFIC GRAVITY, URINE: 1.02 (ref 1.000–1.030)
TOTAL PROTEIN, URINE-UPE24: NEGATIVE
Urine Glucose: NEGATIVE
Urobilinogen, UA: 0.2 (ref 0.0–1.0)

## 2013-12-11 LAB — COMPREHENSIVE METABOLIC PANEL
ALBUMIN: 4.2 g/dL (ref 3.5–5.2)
ALK PHOS: 78 U/L (ref 39–117)
ALT: 18 U/L (ref 0–35)
AST: 25 U/L (ref 0–37)
BILIRUBIN TOTAL: 0.8 mg/dL (ref 0.2–1.2)
BUN: 8 mg/dL (ref 6–23)
CO2: 25 meq/L (ref 19–32)
Calcium: 8.8 mg/dL (ref 8.4–10.5)
Chloride: 104 mEq/L (ref 96–112)
Creatinine, Ser: 0.8 mg/dL (ref 0.4–1.2)
GFR: 88.4 mL/min (ref >60.00)
Glucose, Bld: 87 mg/dL (ref 70–99)
POTASSIUM: 4.3 meq/L (ref 3.5–5.1)
SODIUM: 137 meq/L (ref 135–145)
TOTAL PROTEIN: 7.3 g/dL (ref 6.0–8.3)

## 2013-12-11 LAB — CBC WITH DIFFERENTIAL/PLATELET
BASOS PCT: 0.4 % (ref 0.0–3.0)
Basophils Absolute: 0 10*3/uL (ref 0.0–0.1)
Eosinophils Absolute: 0.1 10*3/uL (ref 0.0–0.7)
Eosinophils Relative: 2.1 % (ref 0.0–5.0)
HEMATOCRIT: 37 % (ref 36.0–46.0)
Hemoglobin: 12.5 g/dL (ref 12.0–15.0)
LYMPHS ABS: 1.8 10*3/uL (ref 0.7–4.0)
Lymphocytes Relative: 29.1 % (ref 12.0–46.0)
MCHC: 33.8 g/dL (ref 30.0–36.0)
MCV: 88.8 fl (ref 78.0–100.0)
MONO ABS: 0.5 10*3/uL (ref 0.1–1.0)
MONOS PCT: 9 % (ref 3.0–12.0)
Neutro Abs: 3.6 10*3/uL (ref 1.4–7.7)
Neutrophils Relative %: 59.4 % (ref 43.0–77.0)
Platelets: 297 10*3/uL (ref 150.0–400.0)
RBC: 4.17 Mil/uL (ref 3.87–5.11)
RDW: 14.5 % (ref 11.5–15.5)
WBC: 6.1 10*3/uL (ref 4.0–10.5)

## 2013-12-11 LAB — CK: Total CK: 88 U/L (ref 7–177)

## 2013-12-11 LAB — TSH: TSH: 2.7 u[IU]/mL (ref 0.35–4.50)

## 2013-12-11 LAB — T4, FREE: FREE T4: 0.74 ng/dL (ref 0.60–1.60)

## 2013-12-11 LAB — RHEUMATOID FACTOR: Rhuematoid fact SerPl-aCnc: <10 IU/mL (ref <=14)

## 2013-12-11 LAB — URIC ACID: Uric Acid, Serum: 4.5 mg/dL (ref 2.4–7.0)

## 2013-12-11 LAB — C-REACTIVE PROTEIN: CRP: <0.5 mg/dL (ref 0.5–20.0)

## 2013-12-11 MED ORDER — MELOXICAM 7.5 MG PO TABS: 7.5000 mg | ORAL_TABLET | Freq: Every day | ORAL | Status: DC

## 2013-12-11 NOTE — Progress Notes (Signed)
Pre visit review using our clinic review tool, if applicable. No additional management support is needed unless otherwise documented below in the visit note. 

## 2013-12-11 NOTE — Assessment & Plan Note (Addendum)
Tender over greater trochanter, bilateral. Hip stretches help. mobic daily. Daily stretches as discussed. Will refer for bursa injections if mobic & stretches do not improve pain.  F/u 2 weeks.

## 2013-12-11 NOTE — Assessment & Plan Note (Signed)
Pt c/o achy hands, knees, feet , hips, sternum for over a year. Not tried anything for pain. DD: osteoarthritis, SLE, inflammatory arthritis, fibromyalgia, thyroid disease. Labs to screen for thyroid disease, RA, SLE. Mobic daily. F/u 2 weeks.

## 2013-12-11 NOTE — Patient Instructions (Signed)
Please take mobic daily for achyness.  Return in 2 to 4 weeks to re-evaluate. We will go over labs at that time and can do womancare exam.  Stretch hips & pelvis daily as discussed. Aspercream can be applied over painful areas of hips in addition to mobic.  See you in 2 weeks.

## 2013-12-11 NOTE — Assessment & Plan Note (Signed)
.  5 cm skin tag w/17m base R labia majora. Pt wishes to have removed. States it is getting bigger. Ref to derm.

## 2013-12-11 NOTE — Assessment & Plan Note (Signed)
Joints involved: hands, knees,hips. 4/18 fibromyalgia tender points. Labs to screen for RS, SLE, thyroid disease. DD: connective tissue disease, thyroid disease Mobic. F/u 2 weeks.

## 2013-12-11 NOTE — Progress Notes (Signed)
Subjective:    Sydney Vaughn is an 38 y.o. female who presents with arthralgias and myalgias that began over  1 year ago. ago. Pain is located in multiple joints and knees, hips, hands, feet, neck, shoulders. The pain is described as achy. . 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 Pain in hips & parasthesia in hands often wakes her from sleep.  Associated symptoms include: frequent HA, tenderness in sternum at xiphoid process. Feels like she has worked out all the [employer].. The patient has tried nothing for pain relief. Related to injury: no. The following portions of the patient's history were reviewed and updated as appropriate: allergies, current medications, past family history, past medical history, past social history, past surgical history and problem list.  Review of Systems Pertinent items are noted in HPI.    Objective:    BP 127/82  Pulse 84  Temp(Src) 97.6 F (36.4 C) (Temporal)  Ht 5' 3.25" (1.607 m)  Wt 182 lb (82.555 kg)  BMI 31.97 kg/m2  SpO2 99%  LMP 12/03/2013 General appearance: alert, cooperative, appears stated age and no distress Head: Normocephalic, without obvious abnormality, atraumatic Eyes: negative findings: lids and lashes normal and conjunctivae and sclerae normal Back: R SI joint tender Extremities: extremities normal, atraumatic, no cyanosis or edema and normal ROM hips & knees. Tender over bilat greater trocanters. Skin: large skin tag R labia majora.    Assessment:  1. Polyarthralgia, myalgia - Antinuclear Antibodies, IFA - CBC with Differential - Comprehensive metabolic panel - TSH - T4, free - Uric acid - CK - Urinalysis, Routine w reflex microscopic - C-reactive protein - Cyclic citrul peptide antibody, IgG - Rheumatoid factor - ENA 9 Panel - meloxicam (MOBIC) 7.5 MG tablet; Take 1 tablet (7.5 mg total) by mouth daily. Take with food.  Dispense:  30 tablet; Refill: 1  2. Skin tag of labia - Ambulatory referral to Dermatology  3. Bilateral hip bursitis See problem list for complete A&P See pt instructions.

## 2013-12-12 LAB — FANA STAINING PATTERNS: Homogeneous Pattern: 1:160 {titer} — ABNORMAL HIGH

## 2013-12-12 LAB — ENA 9 PANEL
CENTROMERE AB SCREEN: NEGATIVE
ENA SM AB SER-ACNC: NEGATIVE
JO-1 ANTIBODY, IGG: NEGATIVE
Ribosomal P Protein Ab: <1
SCLERODERMA (SCL-70) (ENA) ANTIBODY, IGG: NEGATIVE
SM/RNP: NEGATIVE
SSA (Ro) (ENA) Antibody, IgG: <1
SSB (LA) (ENA) ANTIBODY, IGG: NEGATIVE
ds DNA Ab: <1 IU/mL

## 2013-12-12 LAB — CYCLIC CITRUL PEPTIDE ANTIBODY, IGG: Cyclic Citrullin Peptide Ab: <2 U/mL (ref 0.0–5.0)

## 2013-12-12 LAB — ANTINUCLEAR ANTIBODIES, IFA

## 2013-12-13 ENCOUNTER — Telehealth: Payer: Self-pay | Admitting: Nurse Practitioner

## 2013-12-13 ENCOUNTER — Other Ambulatory Visit (INDEPENDENT_AMBULATORY_CARE_PROVIDER_SITE_OTHER): Payer: Federal, State, Local not specified - PPO

## 2013-12-13 DIAGNOSIS — R768 Other specified abnormal immunological findings in serum: Secondary | ICD-10-CM

## 2013-12-13 DIAGNOSIS — IMO0001 Reserved for inherently not codable concepts without codable children: Secondary | ICD-10-CM

## 2013-12-13 DIAGNOSIS — R894 Abnormal immunological findings in specimens from other organs, systems and tissues: Secondary | ICD-10-CM

## 2013-12-13 DIAGNOSIS — M329 Systemic lupus erythematosus, unspecified: Secondary | ICD-10-CM

## 2013-12-13 LAB — CK: CK TOTAL: 135 U/L (ref 7–177)

## 2013-12-13 NOTE — Telephone Encounter (Signed)
Pos ANA 1:160 titer homogenous (SLE?), but ENA neg (DsDNA, Sm ab). RF, aCCP, Neg CRP nml. CBC CMET nml. Will screen for MCTD & CREST. Labs ordered. Plan to ref to rheum if any above labs pos, otherwise check ANA again in 3 mos & refer is still pos. Discussed w/pt. Answered all questions. Daughter has SLE.

## 2013-12-14 LAB — ANGIOTENSIN CONVERTING ENZYME: ANGIOTENSIN-CONVERTING ENZYME: 40 U/L (ref 8–52)

## 2013-12-15 LAB — ALDOLASE: ALDOLASE: 6.4 U/L (ref <=8.1)

## 2013-12-16 LAB — CARDIOLIPIN ANTIBODY: PHOSPHOLIPIDS: 179 mg/dL (ref 151–264)

## 2013-12-17 LAB — BETA-2 GLYCOPROTEIN ANTIBODIES
BETA 2 GLYCO I IGG: 0 G Units (ref <20)
BETA-2-GLYCOPROTEIN I IGM: 2 M Units (ref <20)
Beta-2-Glycoprotein I IgA: 57 A Units — ABNORMAL HIGH (ref <20)

## 2013-12-18 ENCOUNTER — Telehealth: Payer: Self-pay

## 2013-12-18 NOTE — Telephone Encounter (Signed)
[facility] did not come to pick up my specimen from the box on Friday. The blood for the Lupus test is not good anywhere because it sat in the box the whole weekend. I called pt to have her come back by to re-draw. I expressed apology and pt agreed to come in.

## 2013-12-19 ENCOUNTER — Telehealth: Payer: Self-pay | Admitting: *Deleted

## 2013-12-19 NOTE — Telephone Encounter (Signed)
Patient notified of results. Patient stated that she will cb to leave vm with rheumatologist that she would prefer to see.

## 2013-12-19 NOTE — Telephone Encounter (Signed)
Patient left vm requesting cb from [place]. Patient would like more clarification on her lab results. [phone]

## 2013-12-19 NOTE — Telephone Encounter (Signed)
Patient left vm requesting lab results. Please advise?

## 2013-12-19 NOTE — Telephone Encounter (Signed)
Called pt back. Informed pt that her vm had been received and that we will contact her when labs are resulted.

## 2013-12-19 NOTE — Telephone Encounter (Signed)
Labs for MCTD & CREST are neg: ACE, CK, Aldolase. Still is possibility of sero-neg CTD. Phospholipid ab syndrome: Beta 2 glycoprotein IgA elevated, cardiolipin ab nml, still waiting on LA.

## 2013-12-20 NOTE — Telephone Encounter (Signed)
Per Layne. Called patient to let her know that Janann August will be glad to discuss results with her at her f/u appt 12/25/2013. Patient rescheduled appt to 12/31/2013. Patient is persistent that she discuss results with Layne. Patient wouuld like a cb from [place]. Please advise?

## 2013-12-24 ENCOUNTER — Telehealth: Payer: Self-pay | Admitting: *Deleted

## 2013-12-24 DIAGNOSIS — M791 Myalgia, unspecified site: Secondary | ICD-10-CM

## 2013-12-24 DIAGNOSIS — R7989 Other specified abnormal findings of blood chemistry: Secondary | ICD-10-CM

## 2013-12-24 DIAGNOSIS — R768 Other specified abnormal immunological findings in serum: Secondary | ICD-10-CM

## 2013-12-24 NOTE — Telephone Encounter (Signed)
Patient left message on vm concerning rheumatology referral. Patient stated that she would like to Dr. Gavin Pound. Patient also stated that she would still like to speak to [facility] about lab results.

## 2013-12-25 ENCOUNTER — Ambulatory Visit: Payer: Federal, State, Local not specified - PPO | Admitting: Nurse Practitioner

## 2013-12-27 ENCOUNTER — Encounter: Payer: Self-pay | Admitting: Nurse Practitioner

## 2013-12-27 DIAGNOSIS — Z0000 Encounter for general adult medical examination without abnormal findings: Secondary | ICD-10-CM | POA: Insufficient documentation

## 2013-12-31 ENCOUNTER — Ambulatory Visit (INDEPENDENT_AMBULATORY_CARE_PROVIDER_SITE_OTHER): Payer: Federal, State, Local not specified - PPO | Admitting: Nurse Practitioner

## 2013-12-31 ENCOUNTER — Encounter: Payer: Self-pay | Admitting: Nurse Practitioner

## 2013-12-31 VITALS — BP 113/76 | HR 74 | Temp 98.3°F | Ht 63.25 in | Wt 183.0 lb

## 2013-12-31 DIAGNOSIS — IMO0001 Reserved for inherently not codable concepts without codable children: Secondary | ICD-10-CM

## 2013-12-31 DIAGNOSIS — M7071 Other bursitis of hip, right hip: Secondary | ICD-10-CM

## 2013-12-31 DIAGNOSIS — M7072 Other bursitis of hip, left hip: Secondary | ICD-10-CM

## 2013-12-31 DIAGNOSIS — G4710 Hypersomnia, unspecified: Secondary | ICD-10-CM

## 2013-12-31 DIAGNOSIS — M76899 Other specified enthesopathies of unspecified lower limb, excluding foot: Secondary | ICD-10-CM

## 2013-12-31 DIAGNOSIS — M791 Myalgia, unspecified site: Secondary | ICD-10-CM

## 2013-12-31 MED ORDER — PREDNISONE 5 MG PO TABS: ORAL_TABLET | ORAL | Status: DC

## 2013-12-31 MED ORDER — METHOCARBAMOL 500 MG PO TABS: ORAL_TABLET | ORAL | Status: DC

## 2013-12-31 NOTE — Progress Notes (Signed)
Pre visit review using our clinic review tool, if applicable. No additional management support is needed unless otherwise documented below in the visit note. 

## 2013-12-31 NOTE — Patient Instructions (Signed)
Start prednisone tomorrow morning. Take as directed.  Use muscle relaxer at night when pain keeping you awake. Will cause drowsiness for 6 to 8 hours.  Keep stretching.  Let us know if you do not hear from Dr Trudie Reed' ofc by end of week.  See me in 1 month to evaluate prednisone & muscle relaxer if you have not seen Dr Trudie Reed yet.

## 2014-01-02 NOTE — Assessment & Plan Note (Signed)
Ongoing. Stretches help, but pain persistent. Oral prednisone trial.

## 2014-01-02 NOTE — Progress Notes (Signed)
Subjective:     Sydney Vaughn is a 38 y.o. female who presents to review labs to screen for connective tissue disease. She continues to c/o bilat hip pain that interrupts sleep & prevents her from complete exercise routine. Mobic trial was modestly helpful: she had mild relief if she took 15 mg.  She continues to c/o fatigue. Difficult to fall asleep & stay asleep. Rheum w/u reveals pos ANA 1:160, mild elevation of beta2 glycoprotein ab IgA. All other labs nml including CRP, RF, aCCP, ENA, CK, aldolase, ACE, cardiolipin ab, CBC, ua, CMET, uric acid. Her daughter has SLE. She is waiting for rheum appt.    The following portions of the patient's history were reviewed and updated as appropriate: allergies, current medications, past family history, past medical history, past social history, past surgical history and problem list.  Review of Systems Pertinent items are noted in HPI.    Objective:    BP 113/76  Pulse 74  Temp(Src) 98.3 F (36.8 C) (Temporal)  Ht 5' 3.25" (1.607 m)  Wt 183 lb (83.008 kg)  BMI 32.14 kg/m2  SpO2 100%  LMP 12/03/2013 BP 113/76  Pulse 74  Temp(Src) 98.3 F (36.8 C) (Temporal)  Ht 5' 3.25" (1.607 m)  Wt 183 lb (83.008 kg)  BMI 32.14 kg/m2  SpO2 100%  LMP 12/03/2013 General appearance: alert, cooperative, appears stated age, mild distress and frustrated due to no resolve of pain & fatigue Head: Normocephalic, without obvious abnormality, atraumatic Eyes: negative findings: lids and lashes normal and conjunctivae and sclerae normal    Assessment:     1. Myalgia and myositis - predniSONE (DELTASONE) 5 MG tablet; 3T PO QAM X 7d, then 2T PO QAM X 7d, then 1T PO QAM X 7d then d/c.  Dispense: 42 tablet; Refill: 0 - methocarbamol (ROBAXIN) 500 MG tablet; Take 1 to 2 tabs qhs prn  Dispense: 30 tablet; Refill: 1  2. Hypersomnolence  3. Bilateral hip bursitis  See problem list for complete A&P See pt instructions. F/u 1 mo.

## 2014-01-02 NOTE — Assessment & Plan Note (Addendum)
Ongoing, mild relief from 15 mg mobic. Assoc symptoms: fatigue. Labs: ANA 1:160, beta 2 IgA mildly elevated. IgM & G nml. ENA, CK, aldolase, ACE, CRP nml. DD: connective tissue disease, fibromyalgia, mitochondria disease Prednisone trial. Pt has been referred to rheum for eval.

## 2014-01-02 NOTE — Assessment & Plan Note (Signed)
Continues to c/o fatigue during day. States does not sleep well-partly due to hip pain. Robaxin trial.

## 2014-01-16 ENCOUNTER — Encounter: Payer: Self-pay | Admitting: Nurse Practitioner

## 2014-03-18 ENCOUNTER — Encounter: Payer: Self-pay | Admitting: Nurse Practitioner

## 2014-03-18 DIAGNOSIS — R768 Other specified abnormal immunological findings in serum: Secondary | ICD-10-CM | POA: Insufficient documentation

## 2015-03-31 ENCOUNTER — Encounter: Payer: Self-pay | Admitting: Family Medicine

## 2015-03-31 DIAGNOSIS — D649 Anemia, unspecified: Secondary | ICD-10-CM | POA: Insufficient documentation

## 2015-09-10 DIAGNOSIS — L700 Acne vulgaris: Secondary | ICD-10-CM | POA: Diagnosis not present

## 2015-11-23 ENCOUNTER — Ambulatory Visit (INDEPENDENT_AMBULATORY_CARE_PROVIDER_SITE_OTHER): Payer: Federal, State, Local not specified - PPO | Admitting: Family Medicine

## 2015-11-23 ENCOUNTER — Encounter: Payer: Self-pay | Admitting: Family Medicine

## 2015-11-23 VITALS — BP 125/84 | HR 76 | Temp 99.1°F | Resp 20 | Ht 63.25 in | Wt 197.2 lb

## 2015-11-23 DIAGNOSIS — J320 Chronic maxillary sinusitis: Secondary | ICD-10-CM | POA: Diagnosis not present

## 2015-11-23 DIAGNOSIS — H9312 Tinnitus, left ear: Secondary | ICD-10-CM

## 2015-11-23 LAB — CBC WITH DIFFERENTIAL/PLATELET
Basophils Absolute: 66 cells/uL (ref 0–200)
Basophils Relative: 1 %
EOS PCT: 3 %
Eosinophils Absolute: 198 cells/uL (ref 15–500)
HEMATOCRIT: 29.9 % — AB (ref 35.0–45.0)
HEMOGLOBIN: 8.9 g/dL — AB (ref 11.7–15.5)
Lymphocytes Relative: 23 %
Lymphs Abs: 1518 cells/uL (ref 850–3900)
MCH: 20.9 pg — AB (ref 27.0–33.0)
MCHC: 29.8 g/dL — AB (ref 32.0–36.0)
MCV: 70.2 fL — AB (ref 80.0–100.0)
MPV: 9.1 fL (ref 7.5–12.5)
Monocytes Absolute: 792 cells/uL (ref 200–950)
Monocytes Relative: 12 %
NEUTROS PCT: 61 %
Neutro Abs: 4026 cells/uL (ref 1500–7800)
PLATELETS: 419 10*3/uL — AB (ref 140–400)
RBC: 4.26 MIL/uL (ref 3.80–5.10)
RDW: 18.1 % — ABNORMAL HIGH (ref 11.0–15.0)
WBC: 6.6 10*3/uL (ref 3.8–10.8)

## 2015-11-23 MED ORDER — PREDNISONE 20 MG PO TABS: ORAL_TABLET | ORAL | Status: DC

## 2015-11-23 MED ORDER — LEVOFLOXACIN 500 MG PO TABS: 500.0000 mg | ORAL_TABLET | Freq: Every day | ORAL | Status: DC

## 2015-11-23 NOTE — Progress Notes (Signed)
Patient ID: Sydney Vaughn, female   DOB: [birth year], 40 y.o.   MRN: [id number]    Sydney Vaughn , 1975-08-10, 40 y.o., female MRN: [id number] Patient Care Team    Relationship Specialty Notifications Start End  Ma Hillock, DO PCP - General Family Medicine  11/23/15     CC: left ear pain  Subjective: Pt presents for an acute OV with complaints of left ear pain  of 2 weeks duration.  Associated symptoms include neck pain and nausea. Patient noticed sharp pains, heaviness feeling in her left ear. She went on vacation and treated with OTC therapy, however symptoms have worsening. She states she now feels "swimmy headed" at times and left neck discomfort. She endorses ringing in the ears in the past, but feels it is also worse. She denies pulsatile ringing. She states humming will sometimes take the ringing away. She had this symptoms before and was told she had a bad sinus infection. She states She has seen a ENT in the past and they have wanted to perform surgery on her, but she has not returned to them.  She also states she "failed" her hearing test at work, and had to have it completed outpatient. She states it was "borderline". She endorses a low grade fever. No one at home has been ill.   No Known Allergies Social History  Substance Use Topics  . Smoking status: Never Smoker   . Smokeless tobacco: Never Used  . Alcohol Use: No   Past Medical History  Diagnosis Date  . Adult acne   . Asthma    Past Surgical History  Procedure Laterality Date  . Appendectomy    . Tubal ligation     Family History  Problem Relation Age of Onset  . Thyroid disease Mother   . Sinusitis Sister   . Sinusitis Brother   . Lupus Daughter     treated at [facility]  . Allergies Daughter   . Allergies Daughter   . Allergies Daughter   . Allergies Daughter      Medication List       This list is accurate as of: 11/23/15  4:58 PM.  Always use your most recent med list.               ampicillin 500  MG capsule  Commonly known as:  PRINCIPEN  Take 500 mg by mouth 2 (two) times daily.     levofloxacin 500 MG tablet  Commonly known as:  LEVAQUIN  Take 1 tablet (500 mg total) by mouth daily.     predniSONE 20 MG tablet  Commonly known as:  DELTASONE  60 mg x3d, 40 mg x3d, 20 mg x2d, 10 mg x2d        No results found for this or any previous visit (from the past 24 hour(s)). No results found.   ROS: Negative, with the exception of above mentioned in HPI   Objective:  BP 125/84 mmHg  Pulse 76  Temp(Src) 99.1 F (37.3 C) (Oral)  Resp 20  Ht 5' 3.25" (1.607 m)  Wt 197 lb 4 oz (89.472 kg)  BMI 34.65 kg/m2  SpO2 95% Body mass index is 34.65 kg/(m^2). Gen: febrile. No acute distress. Nontoxic in appearance, well developed, well nourished.  HENT: AT. [place]. Bilateral TM visualized cloudy, otherwise normal in appearance. No erythema or bulging. Normal EAM. No mastoid tenderness.  MMM, no oral lesions. Bilateral nares mild erythema, no swelling. Throat without erythema or exudates. No  PND. No cough or hoarseness. TTP left max sinus.  Eyes:Pupils Equal Round Reactive to light, Extraocular movements intact,  Conjunctiva without redness, discharge or icterus. Neck/lymp/endocrine: Supple, no lymphadenopathy. NROM. Mild TTP left paraspinal muscle. No bone tenderness.  CV: RRR Chest: CTAB, no wheeze or crackles. Good air movement, normal resp effort.  Skin: no rashes, purpura or petechiae.  Neuro: Normal gait. PERLA. EOMi. Alert. Oriented x3  Psych: Normal affect, dress and demeanor. Normal speech. Normal thought content and judgment.  Hearing Screening   Method: Audiometry   _0  _1  _2  _3  _4  _5  _6   Right ear:   _7 Left ear:   _8 Assessment/Plan: MITZY NARON is a 40 y.o. female present for acute OV for  Tinnitus of left ear/Chronic maxillary sinusitis - Pt has a history of chronic sinusitis and was referred to ENT last year, which was  considering surgery. She has not followed with them in over a year. Sinus disease recorded back to 2012 with positive left maxillary at that time.  - ? Tension headache/occiptal neuralgia causing neck discomfort; hearing test normal today - Sed Rate (ESR) - prednisone and levaquin 10 days  - CBC w/Diff - work note provided.  - F/U 2 weeks if not improving sooner if worsening.   electronically signed by:  Howard Pouch, DO  [place]

## 2015-11-23 NOTE — Patient Instructions (Signed)
I have called in a prednisone taper for ten days and levaquin (once a day) for 10 days.  Use flonase, nettie pot. Antihistamine of choice.  Rest and hydrate.  Follow up 2 weeks if symptoms do not resolve sooner if worsening.   Tinnitus Tinnitus refers to hearing a sound when there is no actual source for that sound. This is often described as ringing in the ears. However, people with this condition may hear a variety of noises. A person may hear the sound in one ear or in both ears.  The sounds of tinnitus can be soft, loud, or somewhere in between. Tinnitus can last for a few seconds or can be constant for days. It may go away without treatment and come back at various times. When tinnitus is constant or happens often, it can lead to other problems, such as trouble sleeping and trouble concentrating. Almost everyone experiences tinnitus at some point. Tinnitus that is long-lasting (chronic) or comes back often is a problem that may require medical attention.  CAUSES  The cause of tinnitus is often not known. In some cases, it can result from other problems or conditions, including:   Exposure to loud noises from machinery, music, or other sources.  Hearing loss.  Ear or sinus infections.  Earwax buildup.  A foreign object in the ear.  Use of certain medicines.  Use of alcohol and caffeine.  High blood pressure.  Heart diseases.  Anemia.  Allergies.  Meniere disease.  Thyroid problems.  Tumors.  An enlarged part of a weakened blood vessel (aneurysm). SYMPTOMS The main symptom of tinnitus is hearing a sound when there is no source for that sound. It may sound like:   Buzzing.  Roaring.  Ringing.  Blowing air, similar to the sound heard when you listen to a seashell.  Hissing.  Whistling.  Sizzling.  Humming.  Running water.  A sustained musical note. DIAGNOSIS  Tinnitus is diagnosed based on your symptoms. Your health care provider will do a physical  exam. A comprehensive hearing exam (audiologic exam) will be done if your tinnitus:   Affects only one ear (unilateral).  Causes hearing difficulties.  Lasts 6 months or longer. You may also need to see a health care provider who specializes in hearing disorders (audiologist). You may be asked to complete a questionnaire to determine the severity of your tinnitus. Tests may be done to help determine the cause and to rule out other conditions. These can include:  Imaging studies of your head and brain, such as:  A CT scan.  An MRI.  An imaging study of your blood vessels (angiogram). TREATMENT  Treating an underlying medical condition can sometimes make tinnitus go away. If your tinnitus continues, other treatments may include:  Medicines, such as certain antidepressants or sleeping aids.  Sound generators to mask the tinnitus. These include:  Tabletop sound machines that play relaxing sounds to help you fall asleep.  Wearable devices that fit in your ear and play sounds or music.  A small device that uses headphones to deliver a signal embedded in music (acoustic neural stimulation). In time, this may change the pathways of your brain and make you less sensitive to tinnitus. This device is used for very severe cases when no other treatment is working.  Therapy and counseling to help you manage the stress of living with tinnitus.  Using hearing aids or cochlear implants, if your tinnitus is related to hearing loss. HOME CARE INSTRUCTIONS  When possible, avoid  being in loud places and being exposed to loud sounds.  Wear hearing protection, such as earplugs, when you are exposed to loud noises.  Do not take stimulants, such as nicotine, alcohol, or caffeine.  Practice techniques for reducing stress, such as meditation, yoga, or deep breathing.  Use a white noise machine, a humidifier, or other devices to mask the sound of tinnitus.  Sleep with your head slightly raised. This  may reduce the impact of tinnitus.  Try to get plenty of rest each night. SEEK MEDICAL CARE IF:  You have tinnitus in just one ear.  Your tinnitus continues for 3 weeks or longer without stopping.  Home care measures are not helping.  You have tinnitus after a head injury.  You have tinnitus along with any of the following:  Dizziness.  Loss of balance.  Nausea and vomiting.   This information is not intended to replace advice given to you by your health care provider. Make sure you discuss any questions you have with your health care provider.   Document Released: 05/02/2005 Document Revised: 05/23/2014 Document Reviewed: 10/02/2013 Elsevier Interactive Patient Education [employer].

## 2015-11-24 ENCOUNTER — Telehealth: Payer: Self-pay | Admitting: Family Medicine

## 2015-11-24 NOTE — Telephone Encounter (Signed)
Spoke with patient recommended per Dr Raoul Pitch for patient to go to ER for evaluation . Patient verbalized understanding of instructions.

## 2015-11-24 NOTE — Telephone Encounter (Signed)
Please call patient. The back of her head hurts so bad today that she cannot turn her head to the right.

## 2015-11-24 NOTE — Telephone Encounter (Signed)
Pt advised that her current labs resulted with a new anemia and elevated platelets. Her ESR as not resulted as of yet.  She called in expressing "severe neck pain" in which she could not move her head. I have advised her to report to the ED immediately. - WBC count normal. ESR pending. Prior ANA positive. Daughter with lupus and recent admission for encephalitis.  - pt stated she will go to ED

## 2015-11-26 ENCOUNTER — Telehealth: Payer: Self-pay | Admitting: Family Medicine

## 2015-11-26 LAB — SEDIMENTATION RATE

## 2015-11-26 NOTE — Telephone Encounter (Signed)
spoke with patient reviewed lab results and information . Patient states she did not seek treatment for her headache but took tylenol and it resolved. Patient will call back after checking her schedule to set up an appt for her abnormal lab results.

## 2015-11-26 NOTE — Telephone Encounter (Signed)
Please call pt: - please apologize, there has been a lab error and they can not run the sed rate.  - If is still have issues, I would like to get this recollected. I also need to see her ASAP concerning her anemia.

## 2015-12-02 ENCOUNTER — Other Ambulatory Visit: Payer: Self-pay | Admitting: Family Medicine

## 2015-12-02 ENCOUNTER — Ambulatory Visit (INDEPENDENT_AMBULATORY_CARE_PROVIDER_SITE_OTHER): Payer: Federal, State, Local not specified - PPO | Admitting: Family Medicine

## 2015-12-02 ENCOUNTER — Encounter: Payer: Self-pay | Admitting: Family Medicine

## 2015-12-02 VITALS — BP 125/87 | HR 97 | Temp 98.0°F | Resp 20 | Ht 65.0 in | Wt 194.8 lb

## 2015-12-02 DIAGNOSIS — R635 Abnormal weight gain: Secondary | ICD-10-CM

## 2015-12-02 DIAGNOSIS — R5382 Chronic fatigue, unspecified: Secondary | ICD-10-CM | POA: Diagnosis not present

## 2015-12-02 DIAGNOSIS — R5383 Other fatigue: Secondary | ICD-10-CM | POA: Insufficient documentation

## 2015-12-02 DIAGNOSIS — D649 Anemia, unspecified: Secondary | ICD-10-CM | POA: Diagnosis not present

## 2015-12-02 DIAGNOSIS — Z1329 Encounter for screening for other suspected endocrine disorder: Secondary | ICD-10-CM | POA: Diagnosis not present

## 2015-12-02 LAB — CBC WITH DIFFERENTIAL/PLATELET
BASOS PCT: 1 %
Basophils Absolute: 88 cells/uL (ref 0–200)
EOS ABS: 88 {cells}/uL (ref 15–500)
Eosinophils Relative: 1 %
HEMATOCRIT: 28.7 % — AB (ref 35.0–45.0)
Hemoglobin: 8.8 g/dL — ABNORMAL LOW (ref 11.7–15.5)
LYMPHS PCT: 35 %
Lymphs Abs: 3080 cells/uL (ref 850–3900)
MCH: 21.1 pg — ABNORMAL LOW (ref 27.0–33.0)
MCHC: 30.7 g/dL — ABNORMAL LOW (ref 32.0–36.0)
MCV: 68.8 fL — AB (ref 80.0–100.0)
MONO ABS: 704 {cells}/uL (ref 200–950)
MPV: 8.5 fL (ref 7.5–12.5)
Monocytes Relative: 8 %
NEUTROS ABS: 4840 {cells}/uL (ref 1500–7800)
Neutrophils Relative %: 55 %
PLATELETS: 430 10*3/uL — AB (ref 140–400)
RBC: 4.17 MIL/uL (ref 3.80–5.10)
RDW: 18 % — AB (ref 11.0–15.0)
WBC: 8.8 10*3/uL (ref 3.8–10.8)

## 2015-12-02 LAB — RETICULOCYTES
ABS Retic: 79230 cells/uL (ref 20000–80000)
RBC.: 4.17 MIL/uL (ref 3.80–5.10)
RETIC CT PCT: 1.9 %

## 2015-12-02 LAB — URINALYSIS, ROUTINE W REFLEX MICROSCOPIC
BILIRUBIN URINE: NEGATIVE
KETONES UR: NEGATIVE
LEUKOCYTES UA: NEGATIVE
NITRITE: NEGATIVE
RBC / HPF: NONE SEEN (ref 0–?)
Specific Gravity, Urine: >=1.03 — AB (ref 1.000–1.030)
Total Protein, Urine: 30 — AB
Urine Glucose: NEGATIVE
Urobilinogen, UA: 0.2 (ref 0.0–1.0)
pH: 5.5 (ref 5.0–8.0)

## 2015-12-02 LAB — FERRITIN: Ferritin: 3 ng/mL — ABNORMAL LOW (ref 10–232)

## 2015-12-02 LAB — TSH: TSH: 7.74 mIU/L — ABNORMAL HIGH

## 2015-12-02 NOTE — Progress Notes (Signed)
Patient ID: Sydney Vaughn, female   DOB: 1976/05/02, 40 y.o.   MRN: [id number]    Sydney Vaughn , 16-Jan-1976, 40 y.o., female MRN: [id number] Patient Care Team    Relationship Specialty Notifications Start End  Ma Hillock, DO PCP - General Family Medicine  11/23/15     CC: anemia Subjective: Pt presents for an follow up visit for incidentally found anemia. Pt endorses fatigue and occasional palpitations.  She states she has foot surgery last year and when they checked her lab work she was told she was anemic. She has never been anemic prior that she is aware. She does endorse chronic fatigue. She does not take a multivitamin and she does not routinely eat meat. She denies blood loss rectally. She reports her menstrual cycles are 3 days and light. She has had a positive ANA in the past. Her daughter has SLE.  Recent Results (from the past 2160 hour(s))  Sed Rate (ESR)     Status: None   Collection Time: 11/23/15  4:53 PM  Result Value Ref Range   Sed Rate CANCELED mm/hr    Comment: No specimen received.  Result canceled by the ancillary   CBC w/Diff     Status: Abnormal   Collection Time: 11/23/15  4:53 PM  Result Value Ref Range   WBC 6.6 3.8 - 10.8 K/uL   RBC 4.26 3.80 - 5.10 MIL/uL   Hemoglobin 8.9 (L) 11.7 - 15.5 g/dL   HCT 29.9 (L) 35.0 - 45.0 %   MCV 70.2 (L) 80.0 - 100.0 fL   MCH 20.9 (L) 27.0 - 33.0 pg   MCHC 29.8 (L) 32.0 - 36.0 g/dL   RDW 18.1 (H) 11.0 - 15.0 %   Platelets 419 (H) 140 - 400 K/uL   MPV 9.1 7.5 - 12.5 fL   Neutro Abs 4026 1500 - 7800 cells/uL   Lymphs Abs 1518 850 - 3900 cells/uL   Monocytes Absolute 792 200 - 950 cells/uL   Eosinophils Absolute 198 15 - 500 cells/uL   Basophils Absolute 66 0 - 200 cells/uL   Neutrophils Relative % 61 %   Lymphocytes Relative 23 %   Monocytes Relative 12 %   Eosinophils Relative 3 %   Basophils Relative 1 %   Smear Review Criteria for review not met     Comment: ** Please note change in unit of measure and  reference range(s). **    No Known Allergies Social History  Substance Use Topics  . Smoking status: Never Smoker   . Smokeless tobacco: Never Used  . Alcohol Use: No   Past Medical History  Diagnosis Date  . Adult acne   . Asthma   . Chronic sinusitis     CT and MRI.   Marland Kitchen Polyarthralgia     was seeing rheumatology  . ANA positive    Past Surgical History  Procedure Laterality Date  . Appendectomy    . Tubal ligation     Family History  Problem Relation Age of Onset  . Thyroid disease Mother   . Sinusitis Sister   . Sinusitis Brother   . Lupus Daughter     treated at [facility]  . Allergies Daughter      Medication List       This list is accurate as of: 12/02/15  8:49 AM.  Always use your most recent med list.               predniSONE 20  MG tablet  Commonly known as:  DELTASONE  60 mg x3d, 40 mg x3d, 20 mg x2d, 10 mg x2d        No results found for this or any previous visit (from the past 24 hour(s)). No results found.   ROS: Negative, with the exception of above mentioned in HPI   Objective:  BP 125/87 mmHg  Pulse 97  Temp(Src) 98 F (36.7 C)  Resp 20  Ht _0  (1.651 m)  Wt 194 lb 12.8 oz (88.361 kg)  BMI 32.42 kg/m2  SpO2 97% Body mass index is 32.42 kg/(m^2). Gen: Afebrile. No acute distress. Nontoxic in appearance, well developed, well nourished.  HENT: AT. [place]. MMM. Eyes:Pupils Equal Round Reactive to light, Extraocular movements intact,  Conjunctiva without redness, discharge or icterus. Neck/lymp/endocrine: Supple,no lymphadenopathy CV: RRR  Abd: Soft. NTND. BS present. No  Masses palpated. Skin: no rashes, purpura or petechiae.  Neuro:  Normal gait. PERLA. EOMi. Alert. Oriented x3  Psych: Normal affect, dress and demeanor. Normal speech. Normal thought content and judgment.  Assessment/Plan: Sydney Vaughn is a 40 y.o. female present for acute OV for  Anemia, unspecified anemia type/fatigue: - Hgb 8.9, HCT 29.9, MCV 70.2, MCH  20.9, RDW 18.1, PLT 419. No obvious causes by h/o, new since 2015.  - CBC w/Diff repeat - Iron Binding Cap (TIBC) - Ferritin - Sed Rate (ESR) - C-reactive protein - LDH Isoenzyme - Retic - Pathologist smear review - TSH - Urinalysis, Routine w reflex microscopic - HgB A1c - POC Hemoccult Bld/Stl (3-Cd Home Screen); Future  Weight gain/fatigue: - TSH - Urinalysis, Routine w reflex microscopic - HgB A1c  > 25 minutes spent with patient, >50% of time spent face to face counseling patient and coordinating care.    electronically signed by:  Howard Pouch, DO  [place]

## 2015-12-02 NOTE — Patient Instructions (Signed)
I will call you with all results once available.  Please complete cards and send back.

## 2015-12-03 ENCOUNTER — Telehealth: Payer: Self-pay | Admitting: Family Medicine

## 2015-12-03 ENCOUNTER — Encounter: Payer: Self-pay | Admitting: *Deleted

## 2015-12-03 DIAGNOSIS — H9312 Tinnitus, left ear: Secondary | ICD-10-CM

## 2015-12-03 DIAGNOSIS — R7989 Other specified abnormal findings of blood chemistry: Secondary | ICD-10-CM

## 2015-12-03 DIAGNOSIS — R5383 Other fatigue: Secondary | ICD-10-CM

## 2015-12-03 DIAGNOSIS — R635 Abnormal weight gain: Secondary | ICD-10-CM

## 2015-12-03 DIAGNOSIS — D509 Iron deficiency anemia, unspecified: Secondary | ICD-10-CM | POA: Insufficient documentation

## 2015-12-03 LAB — HEMOGLOBIN A1C
HEMOGLOBIN A1C: 5.6 % (ref <5.7)
Mean Plasma Glucose: 114 mg/dL

## 2015-12-03 LAB — SEDIMENTATION RATE: SED RATE: 12 mm/h (ref 0–20)

## 2015-12-03 LAB — IRON AND TIBC
%SAT: 4 % — AB (ref 11–50)
Iron: 16 ug/dL — ABNORMAL LOW (ref 40–190)
TIBC: 407 ug/dL (ref 250–450)
UIBC: 391 ug/dL (ref 125–400)

## 2015-12-03 LAB — C-REACTIVE PROTEIN

## 2015-12-03 LAB — PATHOLOGIST SMEAR REVIEW

## 2015-12-03 MED ORDER — LEVOTHYROXINE SODIUM 25 MCG PO TABS: 25.0000 ug | ORAL_TABLET | Freq: Every day | ORAL | Status: DC

## 2015-12-03 MED ORDER — FERROUS FUMARATE 324 (106 FE) MG PO TABS: 1.0000 | ORAL_TABLET | Freq: Two times a day (BID) | ORAL | Status: DC

## 2015-12-03 NOTE — Telephone Encounter (Signed)
Left message for patient to return call to review directions.also sent information in my chart.

## 2015-12-03 NOTE — Telephone Encounter (Signed)
Please call pt: - her iron is very low. Considering she is anemic, and having fatigue, headaches, left ear tininitus and palpitations I am sending her to hematologist to be evaluated and possibly receive iron transfusion. I have called in an oral iron to start immediately. Encourage a stool softener with use.  - her thyroid level is mild elevated as well. I would like to start a very low dose thyroid medication and retest her thyroid in 8 weeks. Appt needed, can have drawn prior to appt, so results are available to discuss. MUST TAKE on empty stomach alone.

## 2015-12-03 NOTE — Telephone Encounter (Signed)
Spoke with patient reviewed information instructions with patient .

## 2015-12-06 LAB — LACTATE DEHYDROGENASE, ISOENZYMES
LD1/LD2 RATIO: 0.6
LDH 1: 22 % (ref 19–38)
LDH 2: 37 % (ref 30–43)
LDH 3: 22 % (ref 16–26)
LDH 4: 9 % (ref 3–12)
LDH 5: 10 % (ref 3–14)
LDH Isoenzymes, Total: 149 U/L (ref 100–200)

## 2015-12-07 ENCOUNTER — Telehealth: Payer: Self-pay | Admitting: Family Medicine

## 2015-12-07 ENCOUNTER — Encounter: Payer: Self-pay | Admitting: Family Medicine

## 2015-12-07 MED ORDER — FLUCONAZOLE 150 MG PO TABS: 150.0000 mg | ORAL_TABLET | Freq: Once | ORAL | 0 refills | Status: AC

## 2015-12-07 NOTE — Telephone Encounter (Signed)
Please advise. Thanks.  

## 2015-12-07 NOTE — Telephone Encounter (Signed)
Pt advised and voiced understanding.   

## 2015-12-07 NOTE — Telephone Encounter (Signed)
Sent rx for diflucan to pt's local pharmacy.

## 2015-12-09 ENCOUNTER — Telehealth: Payer: Self-pay | Admitting: Family Medicine

## 2015-12-09 ENCOUNTER — Other Ambulatory Visit: Payer: Federal, State, Local not specified - PPO

## 2015-12-09 DIAGNOSIS — D649 Anemia, unspecified: Secondary | ICD-10-CM

## 2015-12-09 LAB — HEMOCCULT SLIDES (X 3 CARDS)
Fecal Occult Blood: NEGATIVE
OCCULT 1: NEGATIVE
OCCULT 2: NEGATIVE
OCCULT 3: NEGATIVE
OCCULT 4: NEGATIVE
OCCULT 5: NEGATIVE

## 2015-12-09 NOTE — Telephone Encounter (Signed)
Please call pt: - her hemoccult cards are negative.  - She should have appt with [facility] now, they will discuss/evaluate and treat her anemia.

## 2015-12-09 NOTE — Addendum Note (Signed)
Addended by: Onalee Hua on: 12/09/2015 02:25 PM   Modules accepted: Orders

## 2015-12-09 NOTE — Telephone Encounter (Signed)
Spoke with patient reviewed lab results.

## 2015-12-14 ENCOUNTER — Telehealth: Payer: Self-pay | Admitting: Hematology and Oncology

## 2015-12-14 ENCOUNTER — Encounter: Payer: Self-pay | Admitting: Hematology and Oncology

## 2015-12-14 ENCOUNTER — Ambulatory Visit (HOSPITAL_BASED_OUTPATIENT_CLINIC_OR_DEPARTMENT_OTHER): Payer: Federal, State, Local not specified - PPO | Admitting: Hematology and Oncology

## 2015-12-14 ENCOUNTER — Ambulatory Visit (HOSPITAL_BASED_OUTPATIENT_CLINIC_OR_DEPARTMENT_OTHER): Payer: Federal, State, Local not specified - PPO

## 2015-12-14 ENCOUNTER — Telehealth: Payer: Self-pay | Admitting: *Deleted

## 2015-12-14 VITALS — BP 126/77 | HR 100 | Temp 99.0°F | Resp 20 | Ht 65.0 in | Wt 198.0 lb

## 2015-12-14 DIAGNOSIS — E559 Vitamin D deficiency, unspecified: Secondary | ICD-10-CM

## 2015-12-14 DIAGNOSIS — D509 Iron deficiency anemia, unspecified: Secondary | ICD-10-CM | POA: Diagnosis not present

## 2015-12-14 DIAGNOSIS — E039 Hypothyroidism, unspecified: Secondary | ICD-10-CM | POA: Diagnosis not present

## 2015-12-14 LAB — CBC & DIFF AND RETIC
BASO%: 0.3 % (ref 0.0–2.0)
Basophils Absolute: 0 10*3/uL (ref 0.0–0.1)
EOS%: 2.7 % (ref 0.0–7.0)
Eosinophils Absolute: 0.2 10*3/uL (ref 0.0–0.5)
HCT: 30.4 % — ABNORMAL LOW (ref 34.8–46.6)
HGB: 9.3 g/dL — ABNORMAL LOW (ref 11.6–15.9)
Immature Retic Fract: 18.6 % — ABNORMAL HIGH (ref 1.60–10.00)
LYMPH%: 22.9 % (ref 14.0–49.7)
MCH: 22.2 pg — ABNORMAL LOW (ref 25.1–34.0)
MCHC: 30.6 g/dL — ABNORMAL LOW (ref 31.5–36.0)
MCV: 72.6 fL — ABNORMAL LOW (ref 79.5–101.0)
MONO#: 0.6 10*3/uL (ref 0.1–0.9)
MONO%: 7.9 % (ref 0.0–14.0)
NEUT#: 4.7 10*3/uL (ref 1.5–6.5)
NEUT%: 66.2 % (ref 38.4–76.8)
Platelets: 302 10*3/uL (ref 145–400)
RBC: 4.19 10*6/uL (ref 3.70–5.45)
RDW: 21.3 % — ABNORMAL HIGH (ref 11.2–14.5)
Retic %: 2.2 % — ABNORMAL HIGH (ref 0.70–2.10)
Retic Ct Abs: 92.18 10*3/uL — ABNORMAL HIGH (ref 33.70–90.70)
WBC: 7.1 10*3/uL (ref 3.9–10.3)
lymph#: 1.6 10*3/uL (ref 0.9–3.3)

## 2015-12-14 NOTE — Progress Notes (Signed)
[place] CONSULT NOTE  Patient Care Team: Ma Hillock, DO as PCP - General (Family Medicine)  CHIEF COMPLAINTS/PURPOSE OF CONSULTATION:  Severe iron deficiency anemia  HISTORY OF PRESENTING ILLNESS:  Sydney Vaughn 40 y.o. female is here because of recent diagnosis of severe iron deficiency anemia  She was found to have abnormal CBC from recent blood work. Her baseline blood work in July 2015 was within normal limits. Most recently, she complained of profound fatigue. She had recent severe microcytic anemia with confirmed iron deficiency from blood work. She  had recent chest pain on exertion, shortness of breath on minimal exertion, pre-syncopal episodes, leg cramps and palpitations. She had not noticed any recent bleeding such as epistaxis, hematuria or hematochezia The patient denies over the counter NSAID ingestion. She is not on antiplatelets agents.  She had no prior history or diagnosis of cancer. Her age appropriate screening programs are up-to-date. She denies any pica and eats a variety of diet. She donated blood many years ago but has never received blood transfusion The patient was prescribed oral iron supplements and she takes 1 a day but that caused significant nausea, vomiting and abdominal bloating. She has 4 children and was told she was anemic in the past. The patient was placed on prednisone 2 years ago for possible autoimmune disorder but that was discontinued. She complained of profound fatigue and was recently diagnosed with hypothyroidism. She denies history of menorrhagia although she have passage of clots during menstruation several months ago. She was supposed she have vitamin D deficiency in the past but has not been placed on vitamin D supplement consistently  MEDICAL HISTORY:  Past Medical History:  Diagnosis Date  . Adult acne   . ANA positive   . Asthma   . Chronic sinusitis    CT and MRI.   Marland Kitchen Polyarthralgia    was seeing  rheumatology    SURGICAL HISTORY: Past Surgical History:  Procedure Laterality Date  . APPENDECTOMY    . TUBAL LIGATION      SOCIAL HISTORY: Social History   Social History  . Marital status: Married    Spouse name: N/A  . Number of children: 4  . Years of education: N/A   Occupational History  .  [employer]    administration   Social History Main Topics  . Smoking status: Never Smoker  . Smokeless tobacco: Never Used  . Alcohol use No  . Drug use: Unknown  . Sexual activity: Yes    Birth control/ protection: Surgical   Other Topics Concern  . Not on file   Social History Narrative   Ms. Balla lives in [place] with her husband and children. She has 4 daughters: ages ranging from 97 yo- 49 yo. 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      FAMILY HISTORY: Family History  Problem Relation Age of Onset  . Thyroid disease Mother   . Sinusitis Sister   . Sinusitis Brother   . Lupus Daughter     treated at [facility]  . Allergies Daughter     ALLERGIES:  has No Known Allergies.  MEDICATIONS:  Current Outpatient Prescriptions  Medication Sig Dispense Refill  . Ferrous Fumarate (HEMOCYTE - 106 MG FE) 324 (106 Fe) MG TABS tablet Take 1 tablet (106 mg of iron total) by mouth 2 (two) times daily. 60 tablet 3  . levothyroxine (LEVOTHROID) 25 MCG tablet Take 1 tablet (  25 mcg total) by mouth daily before breakfast. 30 tablet 1   No current facility-administered medications for this visit.     REVIEW OF SYSTEMS:   Constitutional: Denies fevers, chills or abnormal night sweats Eyes: Denies blurriness of vision, double vision or watery eyes Ears, nose, mouth, throat, and face: Denies mucositis or sore throat Gastrointestinal:  Denies nausea, heartburn or change in bowel habits Skin: Denies abnormal skin rashes Lymphatics: Denies new lymphadenopathy or easy bruising Neurological:Denies numbness, tingling or new  weaknesses Behavioral/Psych: Mood is stable, no new changes  All other systems were reviewed with the patient and are negative.  PHYSICAL EXAMINATION: ECOG PERFORMANCE STATUS: 1 - Symptomatic but completely ambulatory  Vitals:   12/14/15 1433  BP: 126/77  Pulse: 100  Resp: 20  Temp: 99 F (37.2 C)   Filed Weights   12/14/15 1433  Weight: 198 lb (89.8 kg)    GENERAL:alert, no distress and comfortable SKIN: skin color, texture, turgor are normal, no rashes or significant lesions EYES: normal, conjunctiva are pale and non-injected, sclera clear OROPHARYNX:no exudate, no erythema and lips, buccal mucosa, and tongue normal  NECK: supple, thyroid normal size, non-tender, without nodularity LYMPH:  no palpable lymphadenopathy in the cervical, axillary or inguinal LUNGS: clear to auscultation and percussion with normal breathing effort HEART: regular rate & rhythm and no murmurs and no lower extremity edema ABDOMEN:abdomen soft, non-tender and normal bowel sounds Musculoskeletal:no cyanosis of digits and no clubbing  PSYCH: alert & oriented x 3 with fluent speech NEURO: no focal motor/sensory deficits  ASSESSMENT & PLAN:  Anemia, iron deficiency The most likely cause of her anemia is due to chronic blood loss/malabsorption syndrome. We discussed some of the risks, benefits, and alternatives of intravenous iron infusions. The patient is symptomatic from anemia and the iron level is critically low. She tolerated oral iron supplement poorly and desires to achieved higher levels of iron faster for adequate hematopoesis. Some of the side-effects to be expected including risks of infusion reactions, phlebitis, headaches, nausea and fatigue.  The patient is willing to proceed. Patient education material was dispensed.  Goal is to keep ferritin level greater than 50 I plan to redraw baseline CBC today along with iron studies. I will give HER-2 doses of IV iron and recheck next  month   Vitamin D deficiency She has symptoms of diffuse muscular pain and was diagnosed with probable fibromyalgia in the past. She also complained of right history of vitamin D deficiency. I plan to redraw vitamin D level and if her level is low, I will prescribe high-dose vitamin D supplement  Acquired hypothyroidism She has acquired hypothyroidism with profound fatigue. She is currently started on thyroid replacement therapy. Continue the same Could be the cause of prior menorrhagia  Orders Placed This Encounter  Procedures  . CBC & Diff and Retic    Standing Status:   Standing    Number of Occurrences:   9    Standing Expiration Date:   12/13/2016  . Ferritin    Standing Status:   Standing    Number of Occurrences:   9    Standing Expiration Date:   12/13/2016  . Vitamin D 25 hydroxy    Standing Status:   Future    Standing Expiration Date:   12/13/2016     All questions were answered. The patient knows to call the clinic with any problems, questions or concerns. I spent 30 minutes counseling the patient face to face. The total time spent  in the appointment was 40 minutes and more than 50% was on counseling.     [facility], [place], MD 12/14/15 3:22 PM

## 2015-12-14 NOTE — Assessment & Plan Note (Signed)
She has acquired hypothyroidism with profound fatigue. She is currently started on thyroid replacement therapy. Continue the same Could be the cause of prior menorrhagia

## 2015-12-14 NOTE — Assessment & Plan Note (Signed)
The most likely cause of her anemia is due to chronic blood loss/malabsorption syndrome. We discussed some of the risks, benefits, and alternatives of intravenous iron infusions. The patient is symptomatic from anemia and the iron level is critically low. She tolerated oral iron supplement poorly and desires to achieved higher levels of iron faster for adequate hematopoesis. Some of the side-effects to be expected including risks of infusion reactions, phlebitis, headaches, nausea and fatigue.  The patient is willing to proceed. Patient education material was dispensed.  Goal is to keep ferritin level greater than 50 I plan to redraw baseline CBC today along with iron studies. I will give HER-2 doses of IV iron and recheck next month

## 2015-12-14 NOTE — Telephone Encounter (Signed)
Per staff message and POF I have scheduled appts. Advised scheduler of appts. JMW  

## 2015-12-14 NOTE — Assessment & Plan Note (Signed)
She has symptoms of diffuse muscular pain and was diagnosed with probable fibromyalgia in the past. She also complained of right history of vitamin D deficiency. I plan to redraw vitamin D level and if her level is low, I will prescribe high-dose vitamin D supplement

## 2015-12-14 NOTE — Telephone Encounter (Signed)
Gave pt cal & avs °

## 2015-12-15 ENCOUNTER — Telehealth: Payer: Self-pay | Admitting: Hematology and Oncology

## 2015-12-15 ENCOUNTER — Other Ambulatory Visit: Payer: Self-pay | Admitting: *Deleted

## 2015-12-15 ENCOUNTER — Telehealth: Payer: Self-pay | Admitting: *Deleted

## 2015-12-15 LAB — VITAMIN D 25 HYDROXY (VIT D DEFICIENCY, FRACTURES): VIT D 25 HYDROXY: 26.3 ng/mL — AB (ref 30.0–100.0)

## 2015-12-15 LAB — FERRITIN: FERRITIN: 19 ng/mL (ref 9–269)

## 2015-12-15 MED ORDER — ERGOCALCIFEROL 1.25 MG (50000 UT) PO CAPS: 50000.0000 [IU] | ORAL_CAPSULE | ORAL | 0 refills | Status: DC

## 2015-12-15 NOTE — Telephone Encounter (Signed)
-----   Message from Heath Lark, MD sent at 12/15/2015  8:09 AM EDT ----- Regarding: low vitamin D PLs let her know vitamin D is low Call in prescription high dose vitamin D 50,000 units weekly x 12, no refills ----- Message ----- From: Interface, Lab In Three Zero One Sent: 12/14/2015   3:41 PM To: Heath Lark, MD

## 2015-12-15 NOTE — Telephone Encounter (Addendum)
LM for patient to call.  1131: Pt returned call- informed of new prescription

## 2015-12-17 ENCOUNTER — Telehealth: Payer: Self-pay | Admitting: Family Medicine

## 2015-12-17 ENCOUNTER — Ambulatory Visit: Payer: Federal, State, Local not specified - PPO | Admitting: Hematology and Oncology

## 2015-12-17 NOTE — Telephone Encounter (Signed)
[facility] - Client [place] Patient Name: Sydney Vaughn DOB: January 07, 1976 Initial Comment Caller has bumps on tongue that are tender, may have some swelling, just started levothyroxine last week. Concerned about reaction. Nurse Assessment Nurse: Martyn Ehrich, RN, Felicia Date/Time (Eastern Time): 12/17/2015 3:03:44 PM Confirm and document reason for call. If symptomatic, describe symptoms. You must click the next button to save text entered. ---Pt started levothyroxine 1 1/2 wks ago. She has sores in her mouth. Monday her tongue felt funny. Yesterday it started looking like dots on sides and tip of tongue - she has raised areas in the back that are spreading. No swallowing issues. No fever. No rash on any skin. Has the patient traveled out of the country within the last 30 days? ---No Does the patient have any new or worsening symptoms? ---Colbert Ewing a triage be completed? ---Yes Related visit to physician within the last 2 weeks? ---No Does the PT have any chronic conditions? (i.e. diabetes, asthma, etc.) ---Yes List chronic conditions. ---asthma Is the patient pregnant or possibly pregnant? (Ask all females between the ages of 3-55) ---No Is this a behavioral health or substance abuse call? ---No Guidelines Guideline Title Affirmed Question Affirmed Notes Mouth Ulcers 4 or more ulcers Final Disposition User See PCP When Office is Open (within 3 days) Gaddy, RN, Solmon Ice Comments also has iron infusion at 10:15 or 10:30 am for anemia tomorrow at [facility] - She does not have CA - just anemiaReferrals REFERRED TO PCP OFFICE Disagree/Comply: Comply Call Id: [id number]

## 2015-12-17 NOTE — Telephone Encounter (Signed)
Patient states she has a rash on tongue, thinks she is having a reaction to the thyroid medication she is taking.  This started over the weekend.  Patient was transferrred to [facility] since pcp is currently out of the office.

## 2015-12-18 ENCOUNTER — Encounter: Payer: Self-pay | Admitting: Family Medicine

## 2015-12-18 ENCOUNTER — Ambulatory Visit (INDEPENDENT_AMBULATORY_CARE_PROVIDER_SITE_OTHER): Payer: Federal, State, Local not specified - PPO | Admitting: Family Medicine

## 2015-12-18 ENCOUNTER — Encounter: Payer: Self-pay | Admitting: *Deleted

## 2015-12-18 ENCOUNTER — Encounter: Payer: Self-pay | Admitting: Hematology and Oncology

## 2015-12-18 ENCOUNTER — Ambulatory Visit (HOSPITAL_BASED_OUTPATIENT_CLINIC_OR_DEPARTMENT_OTHER): Payer: Federal, State, Local not specified - PPO

## 2015-12-18 VITALS — BP 116/75 | HR 84 | Temp 98.3°F | Resp 20

## 2015-12-18 VITALS — BP 118/81 | HR 85 | Temp 98.3°F | Resp 20 | Wt 195.0 lb

## 2015-12-18 DIAGNOSIS — D509 Iron deficiency anemia, unspecified: Secondary | ICD-10-CM | POA: Diagnosis not present

## 2015-12-18 DIAGNOSIS — K146 Glossodynia: Secondary | ICD-10-CM | POA: Diagnosis not present

## 2015-12-18 DIAGNOSIS — B370 Candidal stomatitis: Secondary | ICD-10-CM

## 2015-12-18 LAB — VITAMIN B12: Vitamin B-12: 369 pg/mL (ref 200–1100)

## 2015-12-18 LAB — MAGNESIUM: Magnesium: 2 mg/dL (ref 1.5–2.5)

## 2015-12-18 MED ORDER — CHLORHEXIDINE GLUCONATE 0.12 % MT SOLN: 15.0000 mL | Freq: Two times a day (BID) | OROMUCOSAL | 1 refills | Status: DC

## 2015-12-18 MED ORDER — FLUCONAZOLE 150 MG PO TABS: 150.0000 mg | ORAL_TABLET | Freq: Once | ORAL | 0 refills | Status: AC

## 2015-12-18 MED ORDER — SODIUM CHLORIDE 0.9 % IV SOLN
510.0000 mg | Freq: Once | INTRAVENOUS | Status: AC
  Administered 2015-12-18: 510 mg via INTRAVENOUS
  Filled 2015-12-18: qty 17

## 2015-12-18 MED ORDER — NYSTATIN 100000 UNIT/ML MT SUSP: 5.0000 mL | Freq: Four times a day (QID) | OROMUCOSAL | 0 refills | Status: DC

## 2015-12-18 MED ORDER — SODIUM CHLORIDE 0.9 % IV SOLN
Freq: Once | INTRAVENOUS | Status: AC
  Administered 2015-12-18: 11:00:00 via INTRAVENOUS

## 2015-12-18 MED ORDER — ALIGN 4 MG PO CAPS: 1.0000 | ORAL_CAPSULE | Freq: Every day | ORAL | 0 refills | Status: DC

## 2015-12-18 NOTE — Patient Instructions (Signed)
I will check for decrease immunity reasons for tongue/thrush discomfort.  I have called in diflucan, nystatin swish and chlorhexidine swish (use swishes at different times of day. ) Start probiotic daily.  If areas do  Not resolve in 3-4 weeks, I will need to see you and consider seeing your dentis as well.

## 2015-12-18 NOTE — Patient Instructions (Signed)

## 2015-12-18 NOTE — Progress Notes (Signed)
Sydney Vaughn , 05/29/1975, 40 y.o., female MRN: [id number] Patient Care Team    Relationship Specialty Notifications Start End  Ma Hillock, DO PCP - General Family Medicine  11/23/15     CC: Mouth Lesions Subjective: Pt presents for an acute OV with complaints of mouth lesins of 5 days duration.  Associated symptoms include aches. She is uncertain if she is brushing too hard or her thrush returned. She still has a white tongue in the morning and scrapes it off with her tooth brush. She has iron deficiency anemia and is receiving her first IV iron treatment today. She has  Started vitamin D replacement, mild Insufficieny of 23. She has had thrush recently after treatment with abs, she was provided with diflucan x1.  She denies changing toothpaste or using new teeth/mout product.  No Known Allergies Social History  Substance Use Topics  . Smoking status: Never Smoker  . Smokeless tobacco: Never Used  . Alcohol use No   Past Medical History:  Diagnosis Date  . Adult acne   . ANA positive   . Asthma   . Chronic sinusitis    CT and MRI.   Marland Kitchen Polyarthralgia    was seeing rheumatology   Past Surgical History:  Procedure Laterality Date  . APPENDECTOMY    . TUBAL LIGATION     Family History  Problem Relation Age of Onset  . Thyroid disease Mother   . Sinusitis Sister   . Sinusitis Brother   . Lupus Daughter     treated at [facility]  . Allergies Daughter      Medication List       Accurate as of 12/18/15  8:27 AM. Always use your most recent med list.          ergocalciferol 50000 units capsule Commonly known as:  VITAMIN D2 Take 1 capsule (50,000 Units total) by mouth once a week.   FERROCITE 324 (106 Fe) MG Tabs tablet Generic drug:  Ferrous Fumarate TAKE 1 TABLET (106 MG OF IRON TOTAL) BY MOUTH 2 (TWO) TIMES DAILY.   levothyroxine 25 MCG tablet Commonly known as:  LEVOTHROID Take 1 tablet (25 mcg total) by mouth daily before breakfast.       No results  found for this or any previous visit (from the past 24 hour(s)). No results found.   ROS: Negative, with the exception of above mentioned in HPI   Objective:  BP 118/81 (BP Location: Left Arm, Patient Position: Sitting, Cuff Size: Large)   Pulse 85   Temp 98.3 F (36.8 C) (Oral)   Resp 20   Wt 195 lb (88.5 kg)   SpO2 97%   BMI 32.45 kg/m  Body mass index is 32.45 kg/m. Gen: Afebrile. No acute distress. Nontoxic in appearance, well developed, well nourished. Pleasant female.  HENT: AT. [place]. MMM, no lip or mucosal lesions. ~10 elevated tender small papules on posterior tongue and mild redness right proximal tongue. Throat without erythema or exudates.  Eyes:Pupils Equal Round Reactive to light, Extraocular movements intact,  Conjunctiva without redness, discharge or icterus. Neck/lymp/endocrine: Supple,no lymphadenopathy CV: RRR  Skin: no rashes, purpura or petechiae.  Neuro:  Normal gait. PERLA. EOMi. Alert. Oriented x3   Assessment/Plan: Sydney Vaughn is a 40 y.o. female present for acute OV for  Oral thrush/tongue Pain - Very mild, no white on tongue currently but pt states she scraped off when brushing before appt.  - pt to avoid switching toothpaste/mouth wash  etc products,  - fluconazole (DIFLUCAN) 150 MG tablet; Take 1 tablet (150 mg total) by mouth once.  Dispense: 1 tablet; Refill: 0 - chlorhexidine (PERIDEX) 0.12 % solution; Use as directed 15 mLs in the mouth or throat 2 (two) times daily.  Dispense: 120 mL; Refill: 1 - nystatin (MYCOSTATIN) 100000 UNIT/ML suspension; Take 5 mLs (500,000 Units total) by mouth 4 (four) times daily.  Dispense: 60 mL; Refill: 0 - HIV antibody (with reflex) - Hepatitis, Acute - Vitamin B12 - Vitamin B1 - Magnesium - Probiotic Product (ALIGN) 4 MG CAPS; Take 1 capsule (4 mg total) by mouth daily.  Dispense: 90 capsule; Refill: 0  > 25 minutes spent with patient, >50% of time spent face to face counseling patient and coordinating  care.   electronically signed by:  Howard Pouch, DO  [place]

## 2015-12-19 LAB — HEPATITIS PANEL, ACUTE
HCV Ab: NEGATIVE
HEP A IGM: NONREACTIVE
HEP B C IGM: NONREACTIVE
HEP B S AG: NEGATIVE

## 2015-12-19 LAB — HIV ANTIBODY (ROUTINE TESTING W REFLEX): HIV: NONREACTIVE

## 2015-12-21 ENCOUNTER — Telehealth: Payer: Self-pay | Admitting: Family Medicine

## 2015-12-21 NOTE — Telephone Encounter (Addendum)
Please call pt: - all her labs are  normal. Her b12 is low normal, she could benefit from OTC daily supplement to boost (usually 100 mg).

## 2015-12-22 ENCOUNTER — Encounter: Payer: Self-pay | Admitting: Family Medicine

## 2015-12-22 LAB — VITAMIN B1: Vitamin B1 (Thiamine): 12 nmol/L (ref 8–30)

## 2015-12-22 NOTE — Telephone Encounter (Signed)
Left message for patient to return call to review results.

## 2015-12-25 ENCOUNTER — Ambulatory Visit (HOSPITAL_BASED_OUTPATIENT_CLINIC_OR_DEPARTMENT_OTHER): Payer: Federal, State, Local not specified - PPO

## 2015-12-25 VITALS — BP 109/73 | HR 85 | Temp 98.9°F | Resp 18

## 2015-12-25 DIAGNOSIS — D509 Iron deficiency anemia, unspecified: Secondary | ICD-10-CM

## 2015-12-25 MED ORDER — SODIUM CHLORIDE 0.9 % IV SOLN
510.0000 mg | Freq: Once | INTRAVENOUS | Status: AC
  Administered 2015-12-25: 510 mg via INTRAVENOUS
  Filled 2015-12-25: qty 17

## 2015-12-25 MED ORDER — SODIUM CHLORIDE 0.9 % IV SOLN
Freq: Once | INTRAVENOUS | Status: AC
  Administered 2015-12-25: 15:00:00 via INTRAVENOUS

## 2015-12-25 NOTE — Patient Instructions (Signed)

## 2016-01-06 ENCOUNTER — Other Ambulatory Visit: Payer: Self-pay | Admitting: Family Medicine

## 2016-01-06 ENCOUNTER — Ambulatory Visit (INDEPENDENT_AMBULATORY_CARE_PROVIDER_SITE_OTHER): Payer: Federal, State, Local not specified - PPO | Admitting: Family Medicine

## 2016-01-06 ENCOUNTER — Encounter: Payer: Self-pay | Admitting: Family Medicine

## 2016-01-06 VITALS — BP 111/77 | HR 84 | Temp 98.3°F | Resp 20 | Wt 194.0 lb

## 2016-01-06 DIAGNOSIS — F458 Other somatoform disorders: Secondary | ICD-10-CM | POA: Diagnosis not present

## 2016-01-06 DIAGNOSIS — R350 Frequency of micturition: Secondary | ICD-10-CM | POA: Diagnosis not present

## 2016-01-06 DIAGNOSIS — K146 Glossodynia: Secondary | ICD-10-CM | POA: Diagnosis not present

## 2016-01-06 DIAGNOSIS — R198 Other specified symptoms and signs involving the digestive system and abdomen: Secondary | ICD-10-CM

## 2016-01-06 DIAGNOSIS — J029 Acute pharyngitis, unspecified: Secondary | ICD-10-CM

## 2016-01-06 DIAGNOSIS — R0989 Other specified symptoms and signs involving the circulatory and respiratory systems: Secondary | ICD-10-CM

## 2016-01-06 LAB — POC URINALSYSI DIPSTICK (AUTOMATED)
BILIRUBIN UA: NEGATIVE
Glucose, UA: 100
KETONES UA: NEGATIVE
Leukocytes, UA: NEGATIVE
NITRITE UA: NEGATIVE
PH UA: 5.5
Protein, UA: NEGATIVE
Spec Grav, UA: 1.02
Urobilinogen, UA: 0.2

## 2016-01-06 NOTE — Patient Instructions (Signed)
I will refer you to ENT for your tongue / throat issues.  We will call you with results of urine studies once available.

## 2016-01-06 NOTE — Progress Notes (Signed)
ALLEA KASSNER , [birth year], 40 y.o., female MRN: [id number] Patient Care Team    Relationship Specialty Notifications Start End  Ma Hillock, DO PCP - General Family Medicine  11/23/15     CC: urinary frequency Subjective: Pt presents for an acute OV with complaints of urinary frequency, flank pain (left) of 6 days duration.  Associated symptoms include nausea. Pt has tried nothing but hydration. She also states that she feels her "depression "remains and she is sore on the left side of her throat. She feels that she remains sore on the left side of her throat. She has been treated with nystatin swish, chlorhexidine swish and Diflucan. Patient has chronic sinusitis and had been followed by ENT in the past.  No Known Allergies Social History  Substance Use Topics  . Smoking status: Never Smoker  . Smokeless tobacco: Never Used  . Alcohol use No   Past Medical History:  Diagnosis Date  . Adult acne   . ANA positive   . Asthma   . Chronic sinusitis    CT and MRI.   Marland Kitchen Polyarthralgia    was seeing rheumatology   Past Surgical History:  Procedure Laterality Date  . APPENDECTOMY    . TUBAL LIGATION     Family History  Problem Relation Age of Onset  . Thyroid disease Mother   . Sinusitis Sister   . Sinusitis Brother   . Lupus Daughter     treated at [facility]  . Allergies Daughter      Medication List       Accurate as of 01/06/16  1:41 PM. Always use your most recent med list.          ALIGN 4 MG Caps Take 1 capsule (4 mg total) by mouth daily.   chlorhexidine 0.12 % solution Commonly known as:  PERIDEX Use as directed 15 mLs in the mouth or throat 2 (two) times daily.   ergocalciferol 50000 units capsule Commonly known as:  VITAMIN D2 Take 1 capsule (50,000 Units total) by mouth once a week.   FERROCITE 324 (106 Fe) MG Tabs tablet Generic drug:  Ferrous Fumarate TAKE 1 TABLET (106 MG OF IRON TOTAL) BY MOUTH 2 (TWO) TIMES DAILY.   levothyroxine 25 MCG  tablet Commonly known as:  LEVOTHROID Take 1 tablet (25 mcg total) by mouth daily before breakfast.       Results for orders placed or performed in visit on 01/06/16 (from the past 24 hour(s))  POCT Urinalysis Dipstick (Automated)     Status: None   Collection Time: 01/06/16  1:25 PM  Result Value Ref Range   Color, UA yellow    Clarity, UA clear    Glucose, UA 100    Bilirubin, UA negative    Ketones, UA negative    Spec Grav, UA 1.020    Blood, UA small    pH, UA 5.5    Protein, UA negative    Urobilinogen, UA 0.2    Nitrite, UA negative    Leukocytes, UA Negative Negative   No results found.  ROS: Negative, with the exception of above mentioned in HPI  Objective:  BP 111/77 (BP Location: Left Arm, Patient Position: Sitting, Cuff Size: Large)   Pulse 84   Temp 98.3 F (36.8 C)   Resp 20   Wt 194 lb (88 kg)   SpO2 97%   BMI 32.28 kg/m  Body mass index is 32.28 kg/m. Gen: Afebrile. No acute distress.  Nontoxic in appearance, well developed, well nourished.  HENT: AT. [place].  MMM, no oral lesions. Throat without erythema or exudates. No white plaques visualized. Eyes:Pupils Equal Round Reactive to light, Extraocular movements intact,  Conjunctiva without redness, discharge or icterus. Neck/lymp/endocrine: Supple, no lymphadenopathy CV: RRR Abd: Soft. Round. NTND. BS present. No Masses palpated. No rebound or guarding.  MSK: No CVA tenderness bilaterally Skin: No rashes, purpura or petechiae.  Neuro:  Normal gait. PERLA. EOMi. Alert. Oriented x3  Psych: Normal affect, dress and demeanor. Normal speech. Normal thought content and judgment.  Assessment/Plan: TYKESHA KONICKI is a 40 y.o. female present for acute OV for  Urinary frequency - POCT Urinalysis Dipstick (Automated) - Urinalysis, Routine w reflex microscopic - Urine Culture  Sore throat/tongue burning sensation/globus sensation - No obvious causes of sore throat, no lymphadenopathy. Discussed possible GERD  has caused with the burning sensation and globus sensation she is experiencing. - Discussed trial of PPI to rule out GERD has potential cause. Patient did not want to start medication at this time. - Discussed referral to ENT so they could potentially visualize the area with a scope, and she opted to have referral placed today for ENT. - Vitamin B-1, B-12, HIV, mag and hepatitis panels all normal. - Diagnosed hypothyroidism with Synthroid supplementation. Receiving iron IV treatments. - Ambulatory referral to ENT   > 25 minutes spent with patient, >50% of time spent face to face counseling patient and coordinating care.  electronically signed by:  Howard Pouch, DO  [place]

## 2016-01-07 LAB — URINALYSIS, ROUTINE W REFLEX MICROSCOPIC
Bilirubin Urine: NEGATIVE
Hgb urine dipstick: NEGATIVE
Ketones, ur: NEGATIVE
LEUKOCYTES UA: NEGATIVE
NITRITE: NEGATIVE
PH: 5.5 (ref 5.0–8.0)
Protein, ur: NEGATIVE
SPECIFIC GRAVITY, URINE: 1.02 (ref 1.001–1.035)

## 2016-01-07 LAB — URINE CULTURE: Organism ID, Bacteria: 10000

## 2016-01-08 ENCOUNTER — Telehealth: Payer: Self-pay | Admitting: Family Medicine

## 2016-01-08 DIAGNOSIS — R81 Glycosuria: Secondary | ICD-10-CM

## 2016-01-08 DIAGNOSIS — M545 Low back pain, unspecified: Secondary | ICD-10-CM

## 2016-01-08 DIAGNOSIS — R809 Proteinuria, unspecified: Secondary | ICD-10-CM

## 2016-01-08 DIAGNOSIS — R5383 Other fatigue: Secondary | ICD-10-CM

## 2016-01-08 NOTE — Telephone Encounter (Signed)
Please call patient: - Her send off urine studies resulted with glucose in her urine.  - Her urine culture is negative for there is no infection. - I would like to perform some additional tests to make certain her kidneys are functioning normal. I have placed these tests for future labs. One of the labs is fasting. One of the appearance is a 24-hour urine. - Patient will be called once all labs are collected, if any abnormal labs. Discussed in detail she will be asked to make an appointment to follow-up.

## 2016-01-11 NOTE — Telephone Encounter (Signed)
Spoke with patient reviewed information and instructions with patient. Patient verbalized understanding.

## 2016-01-13 DIAGNOSIS — R070 Pain in throat: Secondary | ICD-10-CM | POA: Diagnosis not present

## 2016-01-22 ENCOUNTER — Other Ambulatory Visit (HOSPITAL_BASED_OUTPATIENT_CLINIC_OR_DEPARTMENT_OTHER): Payer: Federal, State, Local not specified - PPO

## 2016-01-22 DIAGNOSIS — D509 Iron deficiency anemia, unspecified: Secondary | ICD-10-CM

## 2016-01-22 LAB — CBC & DIFF AND RETIC
BASO%: 0.6 % (ref 0.0–2.0)
Basophils Absolute: 0 10*3/uL (ref 0.0–0.1)
EOS%: 1.8 % (ref 0.0–7.0)
Eosinophils Absolute: 0.1 10*3/uL (ref 0.0–0.5)
HCT: 37.8 % (ref 34.8–46.6)
HGB: 12.4 g/dL (ref 11.6–15.9)
Immature Retic Fract: 11 % — ABNORMAL HIGH (ref 1.60–10.00)
LYMPH%: 24.4 % (ref 14.0–49.7)
MCH: 27.9 pg (ref 25.1–34.0)
MCHC: 32.8 g/dL (ref 31.5–36.0)
MCV: 84.9 fL (ref 79.5–101.0)
MONO#: 0.5 10*3/uL (ref 0.1–0.9)
MONO%: 6.8 % (ref 0.0–14.0)
NEUT%: 66.4 % (ref 38.4–76.8)
NEUTROS ABS: 4.8 10*3/uL (ref 1.5–6.5)
Platelets: 269 10*3/uL (ref 145–400)
RBC: 4.45 10*6/uL (ref 3.70–5.45)
RETIC %: 1.67 % (ref 0.70–2.10)
Retic Ct Abs: 74.32 10*3/uL (ref 33.70–90.70)
WBC: 7.2 10*3/uL (ref 3.9–10.3)
lymph#: 1.8 10*3/uL (ref 0.9–3.3)

## 2016-01-25 ENCOUNTER — Telehealth: Payer: Self-pay | Admitting: *Deleted

## 2016-01-25 LAB — FERRITIN: Ferritin: 59 ng/ml (ref 9–269)

## 2016-01-25 NOTE — Telephone Encounter (Signed)
Informed pt of lab results wnl, Dr. Calton Dach message.  Pt verbalized understanding and says she will have her PCP recheck her labs in 3 months.  Instructed pt to call us if she becomes anemic again and we will schedule her back to see Dr. Alvy Bimler.  She verbalized understanding.

## 2016-01-25 NOTE — Telephone Encounter (Signed)
-----   Message from Heath Lark, MD sent at 01/25/2016  8:59 AM EDT ----- Regarding: labs Pls call her Anemia resolved She does not need appointment this week. You may cancel Recommend recheck labs and iron studies with PCP in 3 months and call us back if she is anemic again ----- Message ----- From: Interface, Lab In Three Zero One Sent: 01/22/2016   3:40 PM To: Heath Lark, MD

## 2016-01-27 ENCOUNTER — Encounter: Payer: Self-pay | Admitting: Family Medicine

## 2016-01-27 ENCOUNTER — Other Ambulatory Visit: Payer: Self-pay | Admitting: *Deleted

## 2016-01-27 ENCOUNTER — Telehealth: Payer: Self-pay | Admitting: Family Medicine

## 2016-01-27 DIAGNOSIS — R7989 Other specified abnormal findings of blood chemistry: Secondary | ICD-10-CM

## 2016-01-27 MED ORDER — LEVOTHYROXINE SODIUM 25 MCG PO TABS: 25.0000 ug | ORAL_TABLET | Freq: Every day | ORAL | 0 refills | Status: DC

## 2016-01-27 NOTE — Telephone Encounter (Signed)
Pt requesting refill of levothyroxine (LEVOTHROID) 25 MCG tablet.   Pharmacy:  [id number]- [place], [place] - [address]

## 2016-01-29 ENCOUNTER — Ambulatory Visit: Payer: Federal, State, Local not specified - PPO

## 2016-01-29 ENCOUNTER — Ambulatory Visit: Payer: Federal, State, Local not specified - PPO | Admitting: Hematology and Oncology

## 2016-02-03 ENCOUNTER — Ambulatory Visit (INDEPENDENT_AMBULATORY_CARE_PROVIDER_SITE_OTHER): Payer: Federal, State, Local not specified - PPO | Admitting: Family Medicine

## 2016-02-03 ENCOUNTER — Encounter: Payer: Self-pay | Admitting: Family Medicine

## 2016-02-03 ENCOUNTER — Other Ambulatory Visit (HOSPITAL_COMMUNITY)
Admission: RE | Admit: 2016-02-03 | Discharge: 2016-02-03 | Disposition: A | Discharge status: Home / Self Care | Payer: Federal, State, Local not specified - PPO | Source: Ambulatory Visit | Attending: Family Medicine | Admitting: Family Medicine

## 2016-02-03 VITALS — BP 111/79 | HR 89 | Temp 98.5°F | Resp 20 | Ht 65.0 in | Wt 196.0 lb

## 2016-02-03 DIAGNOSIS — Z0000 Encounter for general adult medical examination without abnormal findings: Secondary | ICD-10-CM

## 2016-02-03 DIAGNOSIS — R635 Abnormal weight gain: Secondary | ICD-10-CM

## 2016-02-03 DIAGNOSIS — Z1231 Encounter for screening mammogram for malignant neoplasm of breast: Secondary | ICD-10-CM

## 2016-02-03 DIAGNOSIS — Z23 Encounter for immunization: Secondary | ICD-10-CM

## 2016-02-03 DIAGNOSIS — E039 Hypothyroidism, unspecified: Secondary | ICD-10-CM

## 2016-02-03 DIAGNOSIS — Z01419 Encounter for gynecological examination (general) (routine) without abnormal findings: Secondary | ICD-10-CM | POA: Diagnosis not present

## 2016-02-03 DIAGNOSIS — R809 Proteinuria, unspecified: Secondary | ICD-10-CM

## 2016-02-03 DIAGNOSIS — Z6832 Body mass index (BMI) 32.0-32.9, adult: Secondary | ICD-10-CM

## 2016-02-03 DIAGNOSIS — Z124 Encounter for screening for malignant neoplasm of cervix: Secondary | ICD-10-CM

## 2016-02-03 DIAGNOSIS — Z1151 Encounter for screening for human papillomavirus (HPV): Secondary | ICD-10-CM | POA: Insufficient documentation

## 2016-02-03 DIAGNOSIS — R5383 Other fatigue: Secondary | ICD-10-CM | POA: Diagnosis not present

## 2016-02-03 LAB — TSH: TSH: 1.63 u[IU]/mL (ref 0.35–4.50)

## 2016-02-03 LAB — LIPID PANEL
CHOL/HDL RATIO: 4
Cholesterol: 150 mg/dL (ref 0–200)
HDL: 39.2 mg/dL (ref >39.00)
LDL Cholesterol: 93 mg/dL (ref 0–99)
NONHDL: 111.07
TRIGLYCERIDES: 91 mg/dL (ref 0.0–149.0)
VLDL: 18.2 mg/dL (ref 0.0–40.0)

## 2016-02-03 LAB — MICROALBUMIN / CREATININE URINE RATIO
Creatinine,U: 131.5 mg/dL
MICROALB/CREAT RATIO: 0.6 mg/g (ref 0.0–30.0)
Microalb, Ur: 0.8 mg/dL (ref 0.0–1.9)

## 2016-02-03 LAB — PHOSPHORUS: Phosphorus: 4.3 mg/dL (ref 2.3–4.6)

## 2016-02-03 NOTE — Patient Instructions (Signed)
Your yearly physical exam was completed today. You will be up to date with all health maintenance after you complete your mammogram. This has been ordered for you.   We will call you with all results of labs once resulted.   Make sure to supplement Vit d 800 u daily with meals. Make sure to take in 1000-1200 mg calcium intake a day (diet or supplement).   Health Maintenance, Female Adopting a healthy lifestyle and getting preventive care can go a long way to promote health and wellness. Talk with your health care provider about what schedule of regular examinations is right for you. This is a good chance for you to check in with your provider about disease prevention and staying healthy. In between checkups, there are plenty of things you can do on your own. Experts have done a lot of research about which lifestyle changes and preventive measures are most likely to keep you healthy. Ask your health care provider for more information. WEIGHT AND DIET  Eat a healthy diet  Be sure to include plenty of vegetables, fruits, low-fat dairy products, and lean protein.  Do not eat a lot of foods high in solid fats, added sugars, or salt.  Get regular exercise. This is one of the most important things you can do for your health.  Most adults should exercise for at least 150 minutes each week. The exercise should increase your heart rate and make you sweat (moderate-intensity exercise).  Most adults should also do strengthening exercises at least twice a week. This is in addition to the moderate-intensity exercise.  Maintain a healthy weight  Body mass index (BMI) is a measurement that can be used to identify possible weight problems. It estimates body fat based on height and weight. Your health care provider can help determine your BMI and help you achieve or maintain a healthy weight.  For females 42 years of age and older:   A BMI below 18.5 is considered underweight.  A BMI of 18.5 to 24.9 is  normal.  A BMI of 25 to 29.9 is considered overweight.  A BMI of 30 and above is considered obese.  Watch levels of cholesterol and blood lipids  You should start having your blood tested for lipids and cholesterol at 42 years of age, then have this test every 5 years. for lipids and cholesterol at 42 years of age, then have this test every 5 years.  You may need to have your cholesterol levels checked more often if:  Your lipid or cholesterol levels are high.  You are older than 42 years of age.  You are at high risk for heart disease.  CANCER SCREENING   Lung Cancer  Lung cancer screening is recommended for adults 64-42 years old who are at high risk for lung cancer because of a history of smoking.  A yearly low-dose CT scan of the lungs is recommended for people who:  Currently smoke.  Have quit within the past 15 years.  Have at least a 30-pack-year history of smoking. A pack year is smoking an average of one pack of cigarettes a day for 1 year.  Yearly screening should continue until it has been 15 years since you quit.  Yearly screening should stop if you develop a health problem that would prevent you from having lung cancer treatment.  Breast Cancer  Practice breast self-awareness. This means understanding how your breasts normally appear and feel.  It also means doing regular breast self-exams. Let your health care provider know about any changes, no matter how small.  If you are in  your 20s or 30s, you should have a clinical breast exam (CBE) by a health care provider every 1-3 years as part of a regular health exam.  If you are 42 or older, have a CBE every year. Also consider having a breast X-ray (mammogram) every year.  If you have a family history of breast cancer, talk to your health care provider about genetic screening.  If you are at high risk for breast cancer, talk to your health care provider about having an MRI and a mammogram every year.  Breast cancer gene (BRCA) assessment is recommended for women who have family members  with BRCA-related cancers. BRCA-related cancers include:  Breast.  Ovarian.  Tubal.  Peritoneal cancers.  Results of the assessment will determine the need for genetic counseling and BRCA1 and BRCA2 testing. Cervical Cancer Your health care provider may recommend that you be screened regularly for cancer of the pelvic organs (ovaries, uterus, and vagina). This screening involves a pelvic examination, including checking for microscopic changes to the surface of your cervix (Pap test). You may be encouraged to have this screening done every 3 years, beginning at age 26.  For women ages 55-65, health care providers may recommend pelvic exams and Pap testing every 3 years, or they may recommend the Pap and pelvic exam, combined with testing for human papilloma virus (HPV), every 5 years. Some types of HPV increase your risk of cervical cancer. Testing for HPV may also be done on women of any age with unclear Pap test results.  Other health care providers may not recommend any screening for nonpregnant women who are considered low risk for pelvic cancer and who do not have symptoms. Ask your health care provider if a screening pelvic exam is right for you.  If you have had past treatment for cervical cancer or a condition that could lead to cancer, you need Pap tests and screening for cancer for at least 20 years after your treatment. If Pap tests have been discontinued, your risk factors (such as having a new sexual partner) need to be reassessed to determine if screening should resume. Some women have medical problems that increase the chance of getting cervical cancer. In these cases, your health care provider may recommend more frequent screening and Pap tests. Colorectal Cancer  This type of cancer can be detected and often prevented.  Routine colorectal cancer screening usually begins at 42 years of age and continues through 42 years of age.  Your health care provider may recommend  screening at an earlier age if you have risk factors for colon cancer.  Your health care provider may also recommend using home test kits to check for hidden blood in the stool.  A small camera at the end of a tube can be used to examine your colon directly (sigmoidoscopy or colonoscopy). This is done to check for the earliest forms of colorectal cancer.  Routine screening usually begins at age 82.  Direct examination of the colon should be repeated every 5-10 years through 42 years of age. However, you may need to be screened more often if early forms of precancerous polyps or small growths are found. Skin Cancer  Check your skin from head to toe regularly.  Tell your health care provider about any new moles or changes in moles, especially if there is a change in a mole's shape or color.  Also tell your health care provider if you have a mole that is larger than the size of a pencil  eraser.  Always use sunscreen. Apply sunscreen liberally and repeatedly throughout the day.  Protect yourself by wearing long sleeves, pants, a wide-brimmed hat, and sunglasses whenever you are outside. HEART DISEASE, DIABETES, AND HIGH BLOOD PRESSURE   High blood pressure causes heart disease and increases the risk of stroke. High blood pressure is more likely to develop in:  People who have blood pressure in the high end of the normal range (130-139/85-89 mm Hg).  People who are overweight or obese.  People who are African American.  If you are 18-39 years of age, have your blood pressure checked every 3-5 years. If you are 40 years of age or older, have your blood pressure checked every year. You should have your blood pressure measured twice--once when you are at a hospital or clinic, and once when you are not at a hospital or clinic. Record the average of the two measurements. To check your blood pressure when you are not at a hospital or clinic, you can use:  An automated blood pressure machine at a  pharmacy.  A home blood pressure monitor.  If you are between 55 years and 79 years old, ask your health care provider if you should take aspirin to prevent strokes.  Have regular diabetes screenings. This involves taking a blood sample to check your fasting blood sugar level.  If you are at a normal weight and have a low risk for diabetes, have this test once every three years after 42 years of age.  If you are overweight and have a high risk for diabetes, consider being tested at a younger age or more often. PREVENTING INFECTION  Hepatitis B  If you have a higher risk for hepatitis B, you should be screened for this virus. You are considered at high risk for hepatitis B if:  You were born in a country where hepatitis B is common. Ask your health care provider which countries are considered high risk.  Your parents were born in a high-risk country, and you have not been immunized against hepatitis B (hepatitis B vaccine).  You have HIV or AIDS.  You use needles to inject street drugs.  You live with someone who has hepatitis B.  You have had sex with someone who has hepatitis B.  You get hemodialysis treatment.  You take certain medicines for conditions, including cancer, organ transplantation, and autoimmune conditions. Hepatitis C  Blood testing is recommended for:  Everyone born from 1945 through 1965.  Anyone with known risk factors for hepatitis C. Sexually transmitted infections (STIs)  You should be screened for sexually transmitted infections (STIs) including gonorrhea and chlamydia if:  You are sexually active and are younger than 42 years of age.  You are older than 42 years of age and your health care provider tells you that you are at risk for this type of infection.  Your sexual activity has changed since you were last screened and you are at an increased risk for chlamydia or gonorrhea. Ask your health care provider if you are at risk.  If you do not  have HIV, but are at risk, it may be recommended that you take a prescription medicine daily to prevent HIV infection. This is called pre-exposure prophylaxis (PrEP). You are considered at risk if:  You are sexually active and do not regularly use condoms or know the HIV status of your partner(s).  You take drugs by injection.  You are sexually active with a partner who has HIV. Talk with   your health care provider about whether you are at high risk of being infected with HIV. If you choose to begin PrEP, you should first be tested for HIV. You should then be tested every 3 months for as long as you are taking PrEP.  PREGNANCY   If you are premenopausal and you may become pregnant, ask your health care provider about preconception counseling.  If you may become pregnant, take 400 to 800 micrograms (mcg) of folic acid every day.  If you want to prevent pregnancy, talk to your health care provider about birth control (contraception). OSTEOPOROSIS AND MENOPAUSE   Osteoporosis is a disease in which the bones lose minerals and strength with aging. This can result in serious bone fractures. Your risk for osteoporosis can be identified using a bone density scan.  If you are 65 years of age or older, or if you are at risk for osteoporosis and fractures, ask your health care provider if you should be screened.  Ask your health care provider whether you should take a calcium or vitamin D supplement to lower your risk for osteoporosis.  Menopause may have certain physical symptoms and risks.  Hormone replacement therapy may reduce some of these symptoms and risks. Talk to your health care provider about whether hormone replacement therapy is right for you.  HOME CARE INSTRUCTIONS   Schedule regular health, dental, and eye exams.  Stay current with your immunizations.   Do not use any tobacco products including cigarettes, chewing tobacco, or electronic cigarettes.  If you are pregnant, do not  drink alcohol.  If you are breastfeeding, limit how much and how often you drink alcohol.  Limit alcohol intake to no more than 1 drink per day for nonpregnant women. One drink equals 12 ounces of beer, 5 ounces of wine, or 1 ounces of hard liquor.  Do not use street drugs.  Do not share needles.  Ask your health care provider for help if you need support or information about quitting drugs.  Tell your health care provider if you often feel depressed.  Tell your health care provider if you have ever been abused or do not feel safe at home.   This information is not intended to replace advice given to you by your health care provider. Make sure you discuss any questions you have with your health care provider.   Document Released: 11/15/2010 Document Revised: 05/23/2014 Document Reviewed: 04/03/2013 [employer] Interactive Patient Education 2016 [employer]  

## 2016-02-03 NOTE — Progress Notes (Signed)
Patient ID: Sydney Vaughn, female  DOB: 1975/10/16, 40 y.o.   MRN: [id number] Patient Care Team    Relationship Specialty Notifications Start End  Ma Hillock, DO PCP - General Family Medicine  11/23/15     Subjective:  Sydney Vaughn is a 40 y.o.  Female  present for CPE with PAP  All past medical history, surgical history, allergies, family history, immunizations, medications and social history were updated in the electronic medical record today. All recent labs, ED visits and hospitalizations within the last year were reviewed.  Patient reports her menstrual cycles are about every 30 days, occur towards the end of the month. She denies heavy bleeding, menses last about 5 days. She is married and in a monogamous relationship with her husband.  She is concerned about her weight gain. She feels she has gained about 50 lbs over the last year. By EMR she has gained about 20 lbs in 2 years. She is newly diagnosed with hypothyroidism and started synthroid low dose 8 weeks ago. She is still fatigued, but has trouble sleeping. She does not exercise routinely but is active, and states she watches her diet closely. She reports she "eats very little".    Health maintenance:  Colonoscopy: No Fhx, screen at 48 Mammogram: No Fhx, last mammogram completed 2013 for galactorrhea. Routine screen due/ordered today. [facility] [employer] SBE encouraged Cervical cancer screening: last pap: 2013 all normal PAP. Completed today Immunizations: tdap 2009, Influenza due/completed  today (encouraged yearly) Infectious disease screening: HIV completed 2017. DEXA: N/A, RF: Vit d deficient.  Assistive device: None Oxygen use: None Patient has a Dental home. Hospitalizations/ED visits: None  Immunization History  Administered Date(s) Administered  . Influenza Split 02/24/2013  . Influenza,inj,Quad PF,36+ Mos 02/03/2016  . Td 05/17/2007     Past Medical History:  Diagnosis Date  . Adult acne   . ANA  positive   . Anemia   . Asthma   . Chronic sinusitis    CT and MRI.   Marland Kitchen Iron deficiency   . Polyarthralgia    was seeing rheumatology   No Known Allergies Past Surgical History:  Procedure Laterality Date  . APPENDECTOMY    . TUBAL LIGATION     Family History  Problem Relation Age of Onset  . Thyroid disease Mother   . Sinusitis Sister   . Sinusitis Brother   . Lupus Daughter     treated at [facility]  . Allergies Daughter    Social History   Social History  . Marital status: Married    Spouse name: N/A  . Number of children: 4  . Years of education: N/A   Occupational History  .  [employer]    administration   Social History Main Topics  . Smoking status: Never Smoker  . Smokeless tobacco: Never Used  . Alcohol use No  . Drug use: No  . Sexual activity: Yes    Birth control/ protection: Surgical   Other Topics Concern  . Not on file   Social History Narrative   Sydney Vaughn lives in [place] with her husband and children. She has 4 daughters: ages ranging from 40 yo- 100 yo. 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       Medication List       Accurate as of 02/03/16 12:06 PM. Always use your most recent med list.  ALIGN 4 MG Caps Take 1 capsule (4 mg total) by mouth daily.   FERROCITE 324 (106 Fe) MG Tabs tablet Generic drug:  Ferrous Fumarate TAKE 1 TABLET (106 MG OF IRON TOTAL) BY MOUTH 2 (TWO) TIMES DAILY.   levothyroxine 25 MCG tablet Commonly known as:  LEVOTHROID Take 1 tablet (25 mcg total) by mouth daily before breakfast.        Recent Results (from the past 2160 hour(s))  Sed Rate (ESR)     Status: None   Collection Time: 11/23/15  4:53 PM  Result Value Ref Range   Sed Rate CANCELED mm/hr    Comment: No specimen received.  Result canceled by the ancillary   CBC w/Diff     Status: Abnormal   Collection Time: 11/23/15  4:53 PM  Result Value Ref Range   WBC 6.6 3.8 - 10.8 K/uL    RBC 4.26 3.80 - 5.10 MIL/uL   Hemoglobin 8.9 (L) 11.7 - 15.5 g/dL   HCT 29.9 (L) 35.0 - 45.0 %   MCV 70.2 (L) 80.0 - 100.0 fL   MCH 20.9 (L) 27.0 - 33.0 pg   MCHC 29.8 (L) 32.0 - 36.0 g/dL   RDW 18.1 (H) 11.0 - 15.0 %   Platelets 419 (H) 140 - 400 K/uL   MPV 9.1 7.5 - 12.5 fL   Neutro Abs 4,026 1,500 - 7,800 cells/uL   Lymphs Abs 1,518 850 - 3,900 cells/uL   Monocytes Absolute 792 200 - 950 cells/uL   Eosinophils Absolute 198 15 - 500 cells/uL   Basophils Absolute 66 0 - 200 cells/uL   Neutrophils Relative % 61 %   Lymphocytes Relative 23 %   Monocytes Relative 12 %   Eosinophils Relative 3 %   Basophils Relative 1 %   Smear Review Criteria for review not met     Comment: ** Please note change in unit of measure and reference range(s). **  CBC w/Diff     Status: Abnormal   Collection Time: 12/02/15  9:13 AM  Result Value Ref Range   WBC 8.8 3.8 - 10.8 K/uL   RBC 4.17 3.80 - 5.10 MIL/uL   Hemoglobin 8.8 (L) 11.7 - 15.5 g/dL   HCT 28.7 (L) 35.0 - 45.0 %   MCV 68.8 (L) 80.0 - 100.0 fL   MCH 21.1 (L) 27.0 - 33.0 pg   MCHC 30.7 (L) 32.0 - 36.0 g/dL   RDW 18.0 (H) 11.0 - 15.0 %   Platelets 430 (H) 140 - 400 K/uL   MPV 8.5 7.5 - 12.5 fL   Neutro Abs 4,840 1,500 - 7,800 cells/uL   Lymphs Abs 3,080 850 - 3,900 cells/uL   Monocytes Absolute 704 200 - 950 cells/uL   Eosinophils Absolute 88 15 - 500 cells/uL   Basophils Absolute 88 0 - 200 cells/uL   Neutrophils Relative % 55 %   Lymphocytes Relative 35 %   Monocytes Relative 8 %   Eosinophils Relative 1 %   Basophils Relative 1 %   Smear Review SEE NOTE     Comment: Atypical lymphs. Polychromasia present (1-2/hpf) ** Please note change in unit of measure and reference range(s). **   Iron Binding Cap (TIBC)     Status: Abnormal   Collection Time: 12/02/15  9:13 AM  Result Value Ref Range   Iron 16 (L) 40 - 190 ug/dL   UIBC 391 125 - 400 ug/dL   TIBC 407 250 - 450 ug/dL   %SAT 4 (L)  11 - 50 %  Ferritin     Status: Abnormal     Collection Time: 12/02/15  9:13 AM  Result Value Ref Range   Ferritin 3 (L) 10 - 232 ng/mL  Sed Rate (ESR)     Status: None   Collection Time: 12/02/15  9:13 AM  Result Value Ref Range   Sed Rate 12 0 - 20 mm/hr  C-reactive protein     Status: None   Collection Time: 12/02/15  9:13 AM  Result Value Ref Range   CRP <0.5 <0.60 mg/dL  LDH Isoenzyme     Status: None   Collection Time: 12/02/15  9:13 AM  Result Value Ref Range   LDH Isoenzymes, Total 149 100 - 200 U/L   LDH _0 - 38 %   LDH 2 37 30 - 43 %   LDH _1 - 26 %   LDH _2 - 12 %   LDH _3 - 14 %   LD1/LD2 Ratio 0.60     Comment: The optimum decision threshold for the LD1/LD2 is, by ROC curve analysis, 0.93.   Retic     Status: None   Collection Time: 12/02/15  9:13 AM  Result Value Ref Range   Retic Ct Pct 1.9 %   RBC. 4.17 3.80 - 5.10 MIL/uL   ABS Retic 79,230 20,000 - 80,000 cells/uL    Comment: ** Please note change in unit of measure and reference range(s). **  TSH     Status: Abnormal   Collection Time: 12/02/15  9:13 AM  Result Value Ref Range   TSH 7.74 (H) mIU/L    Comment:   Reference Range   > or = 20 Years  0.40-4.50   Pregnancy Range First trimester  0.26-2.66 Second trimester 0.55-2.73 Third trimester  0.43-2.91     Urinalysis, Routine w reflex microscopic     Status: Abnormal   Collection Time: 12/02/15  9:20 AM  Result Value Ref Range   Color, Urine YELLOW Yellow;Lt. Yellow   APPearance CLEAR Clear   Specific Gravity, Urine >=1.030 (A) 1.000 - 1.030   pH 5.5 5.0 - 8.0   Total Protein, Urine 30 (A) Negative   Urine Glucose NEGATIVE Negative   Ketones, ur NEGATIVE Negative   Bilirubin Urine NEGATIVE Negative   Hgb urine dipstick SMALL (A) Negative   Urobilinogen, UA 0.2 0.0 - 1.0   Leukocytes, UA NEGATIVE Negative   Nitrite NEGATIVE Negative   WBC, UA 0-2/hpf 0-2/hpf   RBC / HPF none seen 0-2/hpf   Mucus, UA Presence of (A) None   Squamous Epithelial / LPF Rare(0-4/hpf)  Rare(0-4/hpf)  HgB A1c     Status: None   Collection Time: 12/02/15  9:20 AM  Result Value Ref Range   Hgb A1c MFr Bld 5.6 <5.7 %    Comment:   For the purpose of screening for the presence of diabetes:   <5.7%       Consistent with the absence of diabetes 5.7-6.4 %   Consistent with increased risk for diabetes (prediabetes) >=6.5 %     Consistent with diabetes   This assay result is consistent with a decreased risk of diabetes.   Currently, no consensus exists regarding use of hemoglobin A1c for diagnosis of diabetes in children.   According to American Diabetes Association (ADA) guidelines, hemoglobin A1c <7.0% represents optimal control in non-pregnant diabetic patients. Different metrics may apply to specific patient populations. Standards of Medical  Care in Diabetes (ADA).      Mean Plasma Glucose 114 mg/dL  Pathologist smear review     Status: None   Collection Time: 12/02/15  9:20 AM  Result Value Ref Range   Path Review      Comment: Myeloid population consists predominantly of mature segmented neutrophils.  A few lymphocytes appear reactive.  No immature cells are identified.  RBC's appear to be microcytic and hypochromic on smear review.  Suggest evaluation for iron deficiency, if clinically indicated. Microcytosis and concurrent relative erythrocytosis is suggestive of thalassemia, in the absence of iron deficiency.    The overall findings in this smear are consistent with a reactive thrombocytosis.  Clinical correlation is recommended. Reviewed by Francis Gaines Mammarappallil MD (Electronic Signature on File) 12/03/15   Hemoccult Cards (X3 cards)     Status: None   Collection Time: 12/09/15  2:46 PM  Result Value Ref Range   OCCULT 1 Negative Negative   OCCULT 2 Negative Negative   OCCULT 3 Negative Negative   OCCULT 4 Negative Negative   OCCULT 5 Negative Negative   Fecal Occult Blood Negative Negative  CBC & Diff and Retic     Status: Abnormal   Collection  Time: 12/14/15  3:33 PM  Result Value Ref Range   WBC 7.1 3.9 - 10.3 10e3/uL   NEUT# 4.7 1.5 - 6.5 10e3/uL   HGB 9.3 (L) 11.6 - 15.9 g/dL   HCT 30.4 (L) 34.8 - 46.6 %   Platelets 302 145 - 400 10e3/uL   MCV 72.6 (L) 79.5 - 101.0 fL   MCH 22.2 (L) 25.1 - 34.0 pg   MCHC 30.6 (L) 31.5 - 36.0 g/dL   RBC 4.19 3.70 - 5.45 10e6/uL   RDW 21.3 (H) 11.2 - 14.5 %   lymph# 1.6 0.9 - 3.3 10e3/uL   MONO# 0.6 0.1 - 0.9 10e3/uL   Eosinophils Absolute 0.2 0.0 - 0.5 10e3/uL   Basophils Absolute 0.0 0.0 - 0.1 10e3/uL   NEUT% 66.2 38.4 - 76.8 %   LYMPH% 22.9 14.0 - 49.7 %   MONO% 7.9 0.0 - 14.0 %   EOS% 2.7 0.0 - 7.0 %   BASO% 0.3 0.0 - 2.0 %   Retic % 2.20 (H) 0.70 - 2.10 %   Retic Ct Abs 92.18 (H) 33.70 - 90.70 10e3/uL   Immature Retic Fract 18.60 (H) 1.60 - 10.00 %  Ferritin     Status: None   Collection Time: 12/14/15  3:33 PM  Result Value Ref Range   Ferritin 19 9 - 269 ng/ml  Vitamin D 25 hydroxy     Status: Abnormal   Collection Time: 12/14/15  3:33 PM  Result Value Ref Range   Vitamin D, 25-Hydroxy 26.3 (L) 30.0 - 100.0 ng/mL    Comment: Vitamin D deficiency has been defined by the Institute of Medicine and an Endocrine Society practice guideline as a level of serum 25-OH vitamin D less than 20 ng/mL (1,2). The Endocrine Society went on to further define vitamin D insufficiency as a level between 21 and 29 ng/mL (2). 1. IOM (Institute of Medicine). 2010. Dietary reference    intakes for calcium and D. [place]: The    [employer]. 2. Holick MF, Binkley [place], Bischoff-Ferrari HA, et al.    Evaluation, treatment, and prevention of vitamin D    deficiency: an Endocrine Society clinical practice    guideline. JCEM. 2011 Jul; 96(7):1911-30.   HIV antibody (with reflex)  Status: None   Collection Time: 12/18/15  8:50 AM  Result Value Ref Range   HIV 1&2 Ab, 4th Generation NONREACTIVE NONREACTIVE    Comment:   HIV-1 antigen and HIV-1/HIV-2 antibodies were not  detected.  There is no laboratory evidence of HIV infection.   HIV-1/2 Antibody Diff        Not indicated. HIV-1 RNA, Qual TMA          Not indicated.     PLEASE NOTE: This information has been disclosed to you from records whose confidentiality may be protected by state law. If your state requires such protection, then the state law prohibits you from making any further disclosure of the information without the specific written consent of the person to whom it pertains, or as otherwise permitted by law. A general authorization for the release of medical or other information is NOT sufficient for this purpose.   The performance of this assay has not been clinically validated in patients less than 31 years old.   For additional information please refer to http://education.[facility].com/faq/FAQ106.  (This link is being provided for informational/educational purposes only.)     Hepatitis, Acute     Status: None   Collection Time: 12/18/15  8:50 AM  Result Value Ref Range   Hepatitis B Surface Ag NEGATIVE NEGATIVE   HCV Ab NEGATIVE NEGATIVE   Hep B C IgM NON REACTIVE NON REACTIVE    Comment: High levels of Hepatitis B Core IgM antibody are detectable during the acute stage of Hepatitis B. This antibody is used to differentiate current from past HBV infection.      Hep A IgM NON REACTIVE NON REACTIVE    Comment:   Effective March 31, 2014, Hepatitis Acute Panel (test code [phone]) will be revised to automatically reflex to the Hepatitis C Viral RNA, Quantitative, Real-Time PCR assay if the Hepatitis C antibody screening result is Reactive. This action is being taken to ensure that the CDC/USPSTF recommended HCV diagnostic algorithm with the appropriate test reflex needed for accurate interpretation is followed.     Vitamin B12     Status: None   Collection Time: 12/18/15  8:50 AM  Result Value Ref Range   Vitamin B-12 369 200 - 1,100 pg/mL  Vitamin B1     Status:  None   Collection Time: 12/18/15  8:50 AM  Result Value Ref Range   Vitamin B1 (Thiamine) 12 8 - 30 nmol/L    Comment: Vitamin supplementation within 24 hours prior to blood draw may affect the accuracy of the results. This test was developed and its analytical performance characteristics have been determined by [place], [place]. It has not been cleared or approved by the U.S. Food and Drug Administration. This assay has been validated pursuant to the CLIA regulations and is used for clinical purposes.   Magnesium     Status: None   Collection Time: 12/18/15  8:50 AM  Result Value Ref Range   Magnesium 2.0 1.5 - 2.5 mg/dL  POCT Urinalysis Dipstick (Automated)     Status: None   Collection Time: 01/06/16  1:25 PM  Result Value Ref Range   Color, UA yellow    Clarity, UA clear    Glucose, UA 100    Bilirubin, UA negative    Ketones, UA negative    Spec Grav, UA 1.020    Blood, UA small    pH, UA 5.5    Protein, UA negative    Urobilinogen,  UA 0.2    Nitrite, UA negative    Leukocytes, UA Negative Negative  Urine Culture     Status: None   Collection Time: 01/06/16  2:00 PM  Result Value Ref Range   Colony Count 1,000-10,000 CFU/mL    Organism ID, Bacteria Multiple organisms present,each less than    Organism ID, Bacteria 10,000 CFU/mL.    Organism ID, Bacteria These organisms,commonly found on external    Organism ID, Bacteria and internal genitalia,are considered colonizers.    Organism ID, Bacteria No further testing performed.   Urinalysis, Routine w reflex microscopic (not at [facility])     Status: Abnormal   Collection Time: 01/06/16  2:00 PM  Result Value Ref Range   Color, Urine YELLOW YELLOW    Comment: The preferred specimen for urinalysis testing is the [employer] urine collection tube. Effective 02/06/2016, [employer], a [employer], will reject any urine cup received that is not transferred  into the preferred [employer] tube. Using this device, specimen components remain stable in transit up to 72 hours at ambient temperatures. Urine may be collected in a clean, unused cup and transferred to the yellow cap transfer tube. Supply order number is: [id number]. If you have any questions, please contact your [facility] Account Representative directly, or call our Customer Service Department at [phone].    APPearance CLEAR CLEAR   Specific Gravity, Urine 1.020 1.001 - 1.035   pH 5.5 5.0 - 8.0   Glucose, UA TRACE (A) NEGATIVE   Bilirubin Urine NEGATIVE NEGATIVE   Ketones, ur NEGATIVE NEGATIVE   Hgb urine dipstick NEGATIVE NEGATIVE   Protein, ur NEGATIVE NEGATIVE   Nitrite NEGATIVE NEGATIVE   Leukocytes, UA NEGATIVE NEGATIVE  CBC & Diff and Retic     Status: Abnormal   Collection Time: 01/22/16  3:26 PM  Result Value Ref Range   WBC 7.2 3.9 - 10.3 10e3/uL   NEUT# 4.8 1.5 - 6.5 10e3/uL   HGB 12.4 11.6 - 15.9 g/dL   HCT 37.8 34.8 - 46.6 %   Platelets 269 145 - 400 10e3/uL   MCV 84.9 79.5 - 101.0 fL   MCH 27.9 25.1 - 34.0 pg   MCHC 32.8 31.5 - 36.0 g/dL   RBC 4.45 3.70 - 5.45 10e6/uL   lymph# 1.8 0.9 - 3.3 10e3/uL   MONO# 0.5 0.1 - 0.9 10e3/uL   Eosinophils Absolute 0.1 0.0 - 0.5 10e3/uL   Basophils Absolute 0.0 0.0 - 0.1 10e3/uL   NEUT% 66.4 38.4 - 76.8 %   LYMPH% 24.4 14.0 - 49.7 %   MONO% 6.8 0.0 - 14.0 %   EOS% 1.8 0.0 - 7.0 %   BASO% 0.6 0.0 - 2.0 %   Retic % 1.67 0.70 - 2.10 %   Retic Ct Abs 74.32 33.70 - 90.70 10e3/uL   Immature Retic Fract 11.00 (H) 1.60 - 10.00 %  Ferritin     Status: None   Collection Time: 01/22/16  3:26 PM  Result Value Ref Range   Ferritin 59 9 - 269 ng/ml    Dg Pneumonia Chest 2v  Result Date: 06/30/2010 *RADIOLOGY REPORT* Clinical Data: Shortness of breath. CHEST - 2 VIEW Comparison: None. Findings: Lungs are clear.  No pneumothorax or effusion.  Heart size normal. IMPRESSION: Normal chest. Original Report Authenticated By:  [id number]  [place] Wo Cm  Result Date: 06/30/2010 *RADIOLOGY REPORT* Clinical Data: Sinus pressure.  Cold symptoms. CT PARANASAL SINUSES WITHOUT CONTRAST Technique:  Multidetector CT through the  paranasal sinuses was performed using the standard protocol without intravenous contrast. Comparison: None. Findings: Scattered ethmoid air cell disease is noted.  There is some mucosal thickening which appears mild in the maxillary sinuses, greater on the left.  No air fluid levels identified. Minimal mucosal thickening is seen in the left sphenoid sinus. Mastoid air cells are clear.  Visualized intracranial contents are unremarkable.  Globes are intact and lenses are located. IMPRESSION: Overall mild appearing sinus disease with mucosal thickening most notable in the left maxillary. Original Report Authenticated By: [id number]    ROS: 14 pt review of systems performed and negative (unless mentioned in an HPI)  Objective: BP 111/79 (BP Location: Right Arm, Patient Position: Sitting, Cuff Size: Large)   Pulse 89   Temp 98.5 F (36.9 C)   Resp 20   Ht 5' 5" (1.651 m)   Wt 196 lb (88.9 kg)   SpO2 98%   BMI 32.62 kg/m  Gen: Afebrile. No acute distress. Nontoxic in appearance, well-developed, well-nourished,  Obese, pleasant, caucasian female.  HENT: AT. [place]. Bilateral TM visualized and normal in appearance, normal external auditory canal. MMM, no oral lesions, adequate dentition. Bilateral nares within normal limits. Throat without erythema, ulcerations or exudates. no Cough on exam, no hoarseness on exam. Eyes:Pupils Equal Round Reactive to light, Extraocular movements intact,  Conjunctiva without redness, discharge or icterus. Neck/lymp/endocrine: Supple,no lymphadenopathy, no thyromegaly CV: RRR no murmur, no edema, +2/4 P posterior tibialis pulses.  Chest: CTAB, no wheeze, rhonchi or crackles. normal Respiratory effort. good Air movement. Abd: Soft. Mildly obese. NTND. BS present. no Masses  palpated. No hepatosplenomegaly. No rebound tenderness or guarding. Skin: no rashes, purpura or petechiae. Warm and well-perfused. Skin intact. Neuro/Msk:  Normal gait. PERLA. EOMi. Alert. Oriented x3.  Cranial nerves II through XII intact. Muscle strength 5/5 upper/lower extremity. DTRs equal bilaterally. Psych: Normal affect, dress and demeanor. Normal speech. Normal thought content and judgment. Breasts: breasts appear normal, symmetrical, no tenderness on exam, no suspicious masses, no skin or nipple changes or axillary nodes. GYN:  External genitalia within normal limits, normal hair distribution, no lesions. Urethral meatus normal, no lesions. Vaginal mucosa pink, moist, normal rugae, no lesions. No cystocele or rectocele. cervix without lesions, no discharge. Bimanual exam revealed normal uterus.  No bladder/suprapubic fullness, masses or tenderness. No cervical motion tenderness. No adnexal fullness. Anus and perineum within normal limits, no lesions.   Assessment/plan: Sydney Vaughn is a 40 y.o. female present for CPE with PAP. Encounter for preventive health examination Patient was encouraged to exercise greater than 150 minutes a week. Patient was encouraged to choose a diet filled with fresh fruits and vegetables, and lean meats. AVS provided to patient today for education/recommendation on gender specific health and safety maintenance. Colonoscopy: No Fhx, screen at 48 Mammogram: No Fhx, last mammogram completed 2013 for galactorrhea. Routine screen due/ordered today. [facility] on [employer] SBE encouraged Cervical cancer screening: last pap: 2013 all normal PAP. Completed today Immunizations: tdap 2009, Influenza due/completed  today (encouraged yearly) Infectious disease screening: HIV completed 2017. DEXA: N/A, RF: Vit d deficient.  Influenza vaccine administered - Flu Vaccine QUAD 36+ mos PF IM (Fluarix & Fluzone Quad PF) Encounter for routine gynecological examination/Pap smear  for cervical cancer screening - Cytology - PAP with HPV  Acquired hypothyroidism - new onset hypothyroidism, started synthroid 8 weeks ago.  - TSH repeat today, will adjust dose accordingly after results.   BMI 32.0-32.9,adult/weight gain - PREP program encouraged.  - additional nutrition  counseling referral if desired.  - Discussed calorie intake/exercise > 150 minutes a week and the role of her underactive thyroid in weight gain.  - Lipid panel  Encounter for screening mammogram for breast cancer - [facility] Digital Diagnostic Bilat; Future   Return in about 1 year (around 02/02/2017) for CPE.  Electronically signed by: Howard Pouch, DO [place]

## 2016-02-04 ENCOUNTER — Telehealth: Payer: Self-pay | Admitting: Family Medicine

## 2016-02-04 DIAGNOSIS — R7989 Other specified abnormal findings of blood chemistry: Secondary | ICD-10-CM

## 2016-02-04 LAB — CYTOLOGY - PAP

## 2016-02-04 MED ORDER — LEVOTHYROXINE SODIUM 25 MCG PO TABS: 25.0000 ug | ORAL_TABLET | Freq: Every day | ORAL | 3 refills | Status: DC

## 2016-02-04 NOTE — Telephone Encounter (Addendum)
Please call pt: - her blood work is normal  An looks good. - Her PAP is normal, no report of yeast or bacteria either. Repeat 3 years. - I have called in refills of her thyroid medicine for her (90d script with refills).

## 2016-02-05 NOTE — Telephone Encounter (Signed)
Spoke with patient reviewed lab results and information.

## 2016-02-05 NOTE — Telephone Encounter (Signed)
Left message for patient to call back to review results.

## 2016-02-24 ENCOUNTER — Telehealth: Payer: Self-pay | Admitting: *Deleted

## 2016-02-24 DIAGNOSIS — Z1239 Encounter for other screening for malignant neoplasm of breast: Secondary | ICD-10-CM

## 2016-02-24 NOTE — Telephone Encounter (Signed)
Order for screening mammogram placed.

## 2016-02-29 ENCOUNTER — Telehealth: Payer: Self-pay | Admitting: *Deleted

## 2016-02-29 ENCOUNTER — Other Ambulatory Visit: Payer: Self-pay | Admitting: Family Medicine

## 2016-02-29 DIAGNOSIS — R7989 Other specified abnormal findings of blood chemistry: Secondary | ICD-10-CM

## 2016-02-29 NOTE — Telephone Encounter (Signed)
Aleve or ibuprofen is a good medication to help with bursitis.

## 2016-02-29 NOTE — Telephone Encounter (Signed)
patient called and states she is having bilat hip pain from her . She states she does not want to have to get cortisone injections and is requesting something to take for pain. Please advise.

## 2016-02-29 NOTE — Telephone Encounter (Signed)
Spoke with patient reviewed information. Patient verbalized understanding

## 2016-03-03 ENCOUNTER — Encounter: Payer: Self-pay | Admitting: Family Medicine

## 2016-03-03 DIAGNOSIS — Z1231 Encounter for screening mammogram for malignant neoplasm of breast: Secondary | ICD-10-CM | POA: Diagnosis not present

## 2016-03-03 DIAGNOSIS — G5761 Lesion of plantar nerve, right lower limb: Secondary | ICD-10-CM | POA: Diagnosis not present

## 2016-03-03 DIAGNOSIS — M25571 Pain in right ankle and joints of right foot: Secondary | ICD-10-CM | POA: Diagnosis not present

## 2016-03-03 DIAGNOSIS — M7751 Other enthesopathy of right foot: Secondary | ICD-10-CM | POA: Diagnosis not present

## 2016-03-03 DIAGNOSIS — M2011 Hallux valgus (acquired), right foot: Secondary | ICD-10-CM | POA: Diagnosis not present

## 2016-03-21 ENCOUNTER — Telehealth: Payer: Self-pay | Admitting: *Deleted

## 2016-03-21 NOTE — Telephone Encounter (Signed)
Patient called she had questions about her lipid panel she wanted to know how to bring up her good cholesterol level. Reviewed diet modifications:increased fiber,more fruits and vegetables,lean meats, and fish oil. Patient verbalized understanding. Patient also says she has discontinued her thyroid medication due to extreme hair loss. Offered patient an appt for evaluation of hair loss she declined at this time due to work schedule she states she will call back to schedule an appt at a later date.

## 2016-03-22 NOTE — Telephone Encounter (Signed)
Please call pt: - her hair loss is not likely from the thyroid replacement. Her thyroid levels were normal ON the medicine. Hair loss can be from many things, including stress, low iron, low vit d. I would recommend she continue the thyroid medication. -  If she has concerns, I would be more than happy to see her and we can test her thyroid again if neccessary.

## 2016-03-23 ENCOUNTER — Encounter: Payer: Self-pay | Admitting: *Deleted

## 2016-03-23 NOTE — Telephone Encounter (Signed)
Message sent in my chart

## 2016-03-30 ENCOUNTER — Encounter: Payer: Self-pay | Admitting: Family Medicine

## 2016-03-30 ENCOUNTER — Ambulatory Visit (INDEPENDENT_AMBULATORY_CARE_PROVIDER_SITE_OTHER): Payer: Federal, State, Local not specified - PPO | Admitting: Family Medicine

## 2016-03-30 VITALS — BP 135/69 | HR 88 | Temp 98.5°F | Resp 20 | Wt 194.0 lb

## 2016-03-30 DIAGNOSIS — J0100 Acute maxillary sinusitis, unspecified: Secondary | ICD-10-CM | POA: Diagnosis not present

## 2016-03-30 MED ORDER — DOXYCYCLINE HYCLATE 100 MG PO TABS: 100.0000 mg | ORAL_TABLET | Freq: Two times a day (BID) | ORAL | 0 refills | Status: DC

## 2016-03-30 NOTE — Patient Instructions (Signed)
Start Doxycyline every 12 hours for 10 days  Rest and hydrate Continue mucinex and start flonase daily.    Sinusitis, Adult Sinusitis is soreness and inflammation of your sinuses. Sinuses are hollow spaces in the bones around your face. They are located:  Around your eyes.  In the middle of your forehead.  Behind your nose.  In your cheekbones. Your sinuses and nasal passages are lined with a stringy fluid (mucus). Mucus normally drains out of your sinuses. When your nasal tissues get inflamed or swollen, the mucus can get trapped or blocked so air cannot flow through your sinuses. This lets bacteria, viruses, and funguses grow, and that leads to infection. Follow these instructions at home: Medicines  Take, use, or apply over-the-counter and prescription medicines only as told by your doctor. These may include nasal sprays.  If you were prescribed an antibiotic medicine, take it as told by your doctor. Do not stop taking the antibiotic even if you start to feel better. Hydrate and Humidify  Drink enough water to keep your pee (urine) clear or pale yellow.  Use a cool mist humidifier to keep the humidity level in your home above 50%.  Breathe in steam for 10-15 minutes, 3-4 times a day or as told by your doctor. You can do this in the bathroom while a hot shower is running.  Try not to spend time in cool or dry air. Rest  Rest as much as possible.  Sleep with your head raised (elevated).  Make sure to get enough sleep each night. General instructions  Put a warm, moist washcloth on your face 3-4 times a day or as told by your doctor. This will help with discomfort.  Wash your hands often with soap and water. If there is no soap and water, use hand sanitizer.  Do not smoke. Avoid being around people who are smoking (secondhand smoke).  Keep all follow-up visits as told by your doctor. This is important. Contact a doctor if:  You have a fever.  Your symptoms get  worse.  Your symptoms do not get better within 10 days. Get help right away if:  You have a very bad headache.  You cannot stop throwing up (vomiting).  You have pain or swelling around your face or eyes.  You have trouble seeing.  You feel confused.  Your neck is stiff.  You have trouble breathing. This information is not intended to replace advice given to you by your health care provider. Make sure you discuss any questions you have with your health care provider. Document Released: 10/19/2007 Document Revised: 12/27/2015 Document Reviewed: 02/25/2015 Elsevier Interactive Patient Education  2017 [employer].

## 2016-03-30 NOTE — Progress Notes (Signed)
Sydney Vaughn , [birth year], 40 y.o., female MRN: [id number] Patient Care Team    Relationship Specialty Notifications Start End  Ma Hillock, DO PCP - General Family Medicine  11/23/15     CC: sinus ressure Subjective: Pt presents for an acute OV with complaints of sinus pressure of 5 days duration.  Associated symptoms include coughing, watery eyes, headache, ear pressure, throat pain , chills and sinus burn. She denies fever. She has tried mucinex and chloraseptic spray. 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   No Known Allergies Social History  Substance Use Topics  . Smoking status: Never Smoker  . Smokeless tobacco: Never Used  . Alcohol use No   Past Medical History:  Diagnosis Date  . Adult acne   . ANA positive   . Anemia   . Asthma   . Chronic sinusitis    CT and MRI.   Marland Kitchen Iron deficiency   . Polyarthralgia    was seeing rheumatology   Past Surgical History:  Procedure Laterality Date  . APPENDECTOMY    . TUBAL LIGATION     Family History  Problem Relation Age of Onset  . Thyroid disease Mother   . Sinusitis Sister   . Sinusitis Brother   . Lupus Daughter     treated at [facility]  . Allergies Daughter      Medication List       Accurate as of 03/30/16 10:25 AM. Always use your most recent med list.          ALIGN 4 MG Caps Take 1 capsule (4 mg total) by mouth daily.   ampicillin 500 MG capsule Commonly known as:  PRINCIPEN Take 500 mg by mouth 2 (two) times daily.   FERROCITE 324 (106 Fe) MG Tabs tablet Generic drug:  Ferrous Fumarate TAKE 1 TABLET (106 MG OF IRON TOTAL) BY MOUTH 2 (TWO) TIMES DAILY.   levothyroxine 25 MCG tablet Commonly known as:  LEVOTHROID Take 1 tablet (25 mcg total) by mouth daily before breakfast.       No results found for this or any previous visit (from the past 24 hour(s)). No results found.   ROS: Negative, with the exception of above mentioned in HPI   Objective:  BP  135/69 (BP Location: Left Arm, Patient Position: Sitting, Cuff Size: Large)   Pulse 88   Temp 98.5 F (36.9 C)   Resp 20   Wt 194 lb (88 kg)   SpO2 97%   BMI 32.28 kg/m  Body mass index is 32.28 kg/m. Gen: Afebrile. No acute distress. Nontoxic in appearance, well developed, well nourished. Pleasant female.  HENT: AT. [place]. Bilateral TM visualized dull. MMM, no oral lesions. Bilateral nares with erythema, drainage and swelling. Throat without erythema or exudates. PND present. Cough and hoarseness present. TTP frontal and max sinus.  Eyes:Pupils Equal Round Reactive to light, Extraocular movements intact,  Conjunctiva without redness, discharge or icterus. Neck/lymp/endocrine: Supple,no lymphadenopathy CV: RRR  Chest: CTAB, no wheeze or crackles. Good air movement, normal resp effort.  Skin: no rashes, purpura or petechiae.  Neuro: Normal gait. PERLA. EOMi. Alert. Oriented x3  Assessment/Plan: Sydney Vaughn is a 40 y.o. female present for acute OV for  Acute maxillary sinusitis, recurrence not specified doxycyline BID  Mucinex, flonase,  rest and hydrate  F/U PRN  electronically signed by:  Howard Pouch, DO  [place]

## 2016-04-04 ENCOUNTER — Encounter: Payer: Self-pay | Admitting: Family Medicine

## 2016-04-04 ENCOUNTER — Other Ambulatory Visit: Payer: Self-pay | Admitting: Family Medicine

## 2016-04-04 DIAGNOSIS — J320 Chronic maxillary sinusitis: Secondary | ICD-10-CM

## 2016-04-04 MED ORDER — LEVOFLOXACIN 500 MG PO TABS: 500.0000 mg | ORAL_TABLET | Freq: Every day | ORAL | 0 refills | Status: DC

## 2016-04-04 NOTE — Progress Notes (Signed)
Please see e- chart note

## 2016-04-12 ENCOUNTER — Telehealth: Payer: Self-pay | Admitting: Family Medicine

## 2016-04-12 ENCOUNTER — Encounter: Payer: Self-pay | Admitting: *Deleted

## 2016-04-12 MED ORDER — FLUCONAZOLE 150 MG PO TABS: 150.0000 mg | ORAL_TABLET | Freq: Once | ORAL | 0 refills | Status: AC

## 2016-04-12 NOTE — Telephone Encounter (Signed)
diflucan ordered x1. She can also try OTC treatment.

## 2016-04-15 ENCOUNTER — Encounter: Payer: Self-pay | Admitting: Family Medicine

## 2016-04-20 ENCOUNTER — Encounter: Payer: Self-pay | Admitting: Family Medicine

## 2016-04-20 ENCOUNTER — Ambulatory Visit (INDEPENDENT_AMBULATORY_CARE_PROVIDER_SITE_OTHER): Payer: Federal, State, Local not specified - PPO | Admitting: Family Medicine

## 2016-04-20 VITALS — BP 124/80 | HR 93 | Resp 20 | Wt 200.5 lb

## 2016-04-20 DIAGNOSIS — F418 Other specified anxiety disorders: Secondary | ICD-10-CM | POA: Diagnosis not present

## 2016-04-20 DIAGNOSIS — F064 Anxiety disorder due to known physiological condition: Secondary | ICD-10-CM | POA: Insufficient documentation

## 2016-04-20 DIAGNOSIS — R5383 Other fatigue: Secondary | ICD-10-CM

## 2016-04-20 DIAGNOSIS — E559 Vitamin D deficiency, unspecified: Secondary | ICD-10-CM

## 2016-04-20 DIAGNOSIS — L659 Nonscarring hair loss, unspecified: Secondary | ICD-10-CM | POA: Diagnosis not present

## 2016-04-20 DIAGNOSIS — R768 Other specified abnormal immunological findings in serum: Secondary | ICD-10-CM

## 2016-04-20 DIAGNOSIS — R635 Abnormal weight gain: Secondary | ICD-10-CM | POA: Diagnosis not present

## 2016-04-20 DIAGNOSIS — D508 Other iron deficiency anemias: Secondary | ICD-10-CM

## 2016-04-20 LAB — CBC WITH DIFFERENTIAL/PLATELET
BASOS PCT: 1 %
Basophils Absolute: 67 cells/uL (ref 0–200)
EOS ABS: 268 {cells}/uL (ref 15–500)
Eosinophils Relative: 4 %
HEMATOCRIT: 36.3 % (ref 35.0–45.0)
HEMOGLOBIN: 11.8 g/dL (ref 11.7–15.5)
LYMPHS ABS: 1943 {cells}/uL (ref 850–3900)
LYMPHS PCT: 29 %
MCH: 28.4 pg (ref 27.0–33.0)
MCHC: 32.5 g/dL (ref 32.0–36.0)
MCV: 87.3 fL (ref 80.0–100.0)
MONO ABS: 670 {cells}/uL (ref 200–950)
MPV: 9.2 fL (ref 7.5–12.5)
Monocytes Relative: 10 %
NEUTROS PCT: 56 %
Neutro Abs: 3752 cells/uL (ref 1500–7800)
Platelets: 362 10*3/uL (ref 140–400)
RBC: 4.16 MIL/uL (ref 3.80–5.10)
RDW: 13.9 % (ref 11.0–15.0)
WBC: 6.7 10*3/uL (ref 3.8–10.8)

## 2016-04-20 LAB — FERRITIN: Ferritin: 6 ng/mL — ABNORMAL LOW (ref 10–232)

## 2016-04-20 LAB — FOLATE: Folate: 11.8 ng/mL (ref >5.4)

## 2016-04-20 MED ORDER — VENLAFAXINE HCL ER 37.5 MG PO CP24: ORAL_CAPSULE | ORAL | 0 refills | Status: DC

## 2016-04-20 NOTE — Patient Instructions (Addendum)
Please start the effexor 1 cap today for 7 days and then increase to 2 caps daily until you see me for follow up in 4 weeks.  Restart the thyroid medication and we will retest in 6-8 weeks.  We call with results of all labs once received.     Basic instructions:  Prescription refills and request:  -In order to allow more efficient response time, please call your pharmacy for all refills. They will forward the request electronically to [place]. This allows for the quickest possible response. Request left on a nurse line can take longer to refill, since these are checked as time allows between office patients and other phone calls.  - refill request can take up to 3-5 working days to complete.  - If request is sent electronically and request is appropiate, it is usually completed in 1-2 business days.  - all patients will need to be seen routinely for all chronic medical conditions requiring prescription medications (see follow-up below). If you are overdue for follow up on your condition, you will be asked to make an appointment and we will call in enough medication to cover you until your appointment (up to 30 days).  - all controlled substances will require a face to face visit to request/refill.   Results: If any images or labs were ordered, it can take up to 1 week to get results depending on the test ordered and the lab/facility running and resulting the test. - Normal or stable results, which do not need further discussion, will be released to your mychart immediately with attached note to you. A call will not be generated for normal results. Please make certain to sign up for mychart. If you have questions on how to activate your mychart you can call the front office.  - If your results need further discussion, our office will attempt to contact you via phone, and if unable to reach you after 2 attempts, we will release your abnormal result to your mychart with instructions.  - All results will be  automatically released in mychart after 1 week.  - Your provider will provide you with explanation and instruction on all relevant material in your results. Please keep in mind, results and labs may appear confusing or abnormal to the untrained eye, but it does not mean they are actually abnormal for you personally. If you have any questions about your results that are not covered, or you desire more detailed explanation than what was provided, you should make an appointment with your provider to do so.   Our office handles many outgoing and incoming calls daily. If we have not contacted you within 1 week about your results, please check your mychart to see if there is a message first and if not, then contact our office.  In helping with this matter, you help decrease call volume, and therefore allow [place] to be able to respond to patients needs more efficiently.   Follow up visits:  Depending on your condition(s) your provider will need to see you routinely in order to provide you with quality care and prescribe medication(s). Most chronic conditions (Example: hypertension, Diabetes, depression/anxiety... etc), require visits a couple times a year. Your provider will instruct you on proper follow up for your personal medical conditions and history. Please make certain to make follow up appointments for your condition as instructed. Failing to do so could result in lapse in your medication treatment/refills. If you request a refill, and are overdue to be seen  on a condition, we will always provide you with a 30 day script (once) to allow you time to schedule.   Acute office visits (sick visit):  An acute visit is intended for a new problem and are scheduled in shorter time slots to allow schedule openings for patients with new problems. This is the appropriate visit to discuss new problems. In order to provide you with excellent quality medical care with proper time for you to explain your problem, have an  exam and receive treatment with instructions, these appointments should be limited to one new problem per visit. If you experience a new problem, in which you desire to be addressed, please make an acute office visit, we save openings on the schedule to accommodate you. Please do not save your new problem for any other type of visit, let us take care of it properly and quickly for you.   Medicare wellness (well visit): - we have a wonderful Nurse Maudie Mercury), that will meet with you and provide you will yearly medicare wellness visits. These visits should occur yearly (can not be scheduled less than 1 calendar year apart) and cover preventive health, immunizations, advance directives and screenings you are entitled to yearly through your medicare benefits. Do not miss out on your entitled benefits, this is when medicare will pay for this benefits to be ordered for you.  These are strongly encouraged by your provider and is the appropriate type of visit to make certain you are up to date with all preventive health benefits. If you have not had your medicare wellness exam in the last 12 months, please make certain to schedule one by calling the office and schedule your medicare wellness with Maudie Mercury as soon as possible.   Yearly physical (well visit):  - Adults are recommended to be seen yearly for physicals. Check with your insurance and date of your last physical, most insurances require one calendar year between physicals. Physicals include all preventive health topics, screenings, medical exam and labs that are appropriate for gender/age and history. You may have fasting labs needed at this visit. This is a well visit (not a sick visit), acute topics should not be covered during this visit.  - Pediatric patients are seen more frequently when they are younger. Your provider will advise you on well child visit timing that is appropriate for your their age. - This is not a medicare wellness visit. Medicare wellness  exams do not have an exam portion to the visit. Some medicare companies allow for a physical, some do not allow a yearly physical. If your medicare allows a yearly physical you can schedule the medicare wellness with our nurse Maudie Mercury and have your physical with your provider after, on the same day. Please check with insurance for your full benefits.

## 2016-04-20 NOTE — Progress Notes (Signed)
Sydney Vaughn , March 08, 1976, 40 y.o., female MRN: [id number] Patient Care Team    Relationship Specialty Notifications Start End  Ma Hillock, DO PCP - General Family Medicine  11/23/15     CC: hair loss Subjective: Pt presents for an OV with complaints of hair loss of 3 weeks, weight gain over the last year, emotional distress and chronic difficulty sleeping. Patient states that she had used the [place] product associated with the side effects of hair loss, but she stopped the use quite a few months prior to the onset of her hair loss. She had been diagnosed with hypothyroidism in July, and started on 25 g of Synthroid. She has stopped the Synthroid approximately 3 weeks ago, because she felt that maybe her hair loss was associated with the Synthroid use. She has known vitamin D and iron deficiency, and which she takes a supplement for each. She has had iron infusions that started in July for iron deficiency anemia. Her last infusion was in September. She has continued to take the oral iron. She has been ANA positive, homogenous >1:160, with negative antibodies. She has been evaluated by rheumatology in the past for arthralgias. She feels she has continued to gain weight over the last year. She is upset because she is working out and watching what she eats, and continues to gain weight. Records indicate she initially lost 3 pounds and then gained 6 pounds over the last 6 months. She has also started to take biotin and a multivitamin over the last few weeks. She does not feel like she's increasingly depressed, does admit to stress. She feels she is more irritable and emotional. She has established with a dermatologist. She admits that her menstrual cycles continue to come on a regular time, but has had some mild increase in bleeding. No family history of colon cancer. No bowel changes.  mood disorder screen: negative  GAD 7 : Generalized Anxiety Score 04/20/2016  Nervous, Anxious, on Edge 1    Control/stop worrying 3  Worry too much - different things 2  Trouble relaxing 3  Restless 2  Easily annoyed or irritable 2  Afraid - awful might happen 1  Total GAD 7 Score 14  Anxiety Difficulty Very difficult    Depression screen [facility] 2/9 04/20/2016 02/03/2016  Decreased Interest 2 0  Down, Depressed, Hopeless 1 0  PHQ - 2 Score 3 0  Altered sleeping 3 -  Tired, decreased energy 3 -  Change in appetite 1 -  Feeling bad or failure about yourself  0 -  Trouble concentrating 1 -  Moving slowly or fidgety/restless 1 -  Suicidal thoughts 0 -  PHQ-9 Score 12 -  Difficult doing work/chores Very difficult -    No Known Allergies Social History  Substance Use Topics  . Smoking status: Never Smoker  . Smokeless tobacco: Never Used  . Alcohol use No   Past Medical History:  Diagnosis Date  . Adult acne   . ANA positive   . Anemia   . Asthma   . Chronic sinusitis    CT and MRI.   Marland Kitchen Iron deficiency   . Polyarthralgia    was seeing rheumatology   Past Surgical History:  Procedure Laterality Date  . APPENDECTOMY    . TUBAL LIGATION     Family History  Problem Relation Age of Onset  . Thyroid disease Mother   . Sinusitis Sister   . Sinusitis Brother   . Lupus Daughter  treated at [facility]  . Allergies Daughter      Medication List       Accurate as of 04/20/16  3:58 PM. Always use your most recent med list.          ALIGN 4 MG Caps Take 1 capsule (4 mg total) by mouth daily.   BIOTIN 5000 5 MG Caps Generic drug:  Biotin Take by mouth.   FERROCITE 324 (106 Fe) MG Tabs tablet Generic drug:  Ferrous Fumarate TAKE 1 TABLET (106 MG OF IRON TOTAL) BY MOUTH 2 (TWO) TIMES DAILY.   levothyroxine 25 MCG tablet Commonly known as:  LEVOTHROID Take 1 tablet (25 mcg total) by mouth daily before breakfast.   multivitamin capsule Take 1 capsule by mouth daily.       No results found for this or any previous visit (from the past 24 hour(s)). No results  found.   ROS: Negative, with the exception of above mentioned in HPI   Objective:  BP 124/80 (BP Location: Left Arm, Patient Position: Sitting, Cuff Size: Large)   Pulse 93   Resp 20   Wt 200 lb 8 oz (90.9 kg)   SpO2 97%   BMI 33.36 kg/m  Body mass index is 33.36 kg/m. Gen: Afebrile. No acute distress. Nontoxic in appearance, well developed, well nourished. Obese caucasian female.  HENT: AT. [place]. MMM, no oral lesions. Eyes:Pupils Equal Round Reactive to light, Extraocular movements intact,  Conjunctiva without redness, discharge or icterus. Neck/lymp/endocrine: Supple, no lymphadenopathy Skin/scalp: no rashes, purpura or petechiae. No obvious hair thinning, breakage or loss, no patchy hair loss. No dry skin.  Neuro: Normal gait. PERLA. EOMi. Alert. Oriented x3  Psych: tearful, emotional. Normal dress and demeanor. Normal speech. Normal thought content and judgment.  Assessment/Plan: Sydney Vaughn is a 40 y.o. female present for OV for  Fatigue, unspecified type Weight gain Hair loss Other iron deficiency anemia Vitamin D deficiency Positive ANA (antinuclear antibody) - pt has stated to staff she does not feel her concerns are being addressed during her office visits. Discussed with pt today she is required to be seen in an office visit and follow ups for conditions and her symptoms can not be treated over email. Many of her symptoms can be multi-factorial with a ddx of a few of her already known conditions including: thyroid abnormalities, stress, anxiety, vitamin d deficiency, iron deficiency, positive ANA/autoimmune, perimenopause etc. Your symptoms are not something that fit easily into one bracket and can take time to discover. Repeat labs for her today. - she is restart thyroid medication, which she stopped on her own.  - start anxiety treatment (see below).  - consider dermatology referral for hair loss- she has a dermatologist she is happy with and can see for this if  necessary.  - consider rheumatology for further autoimmune work up.  - CBC w/Diff - COMPLETE METABOLIC PANEL WITH GFR - Thyroid Panel With TSH - Vitamin D (25 hydroxy) - Sed Rate (ESR) - C-reactive protein - Anti-DNA antibody, double-stranded - Histone antibodies, IgG, blood - Iron Binding Cap (TIBC) - Ferritin - Folate - pt will need to f/u in 4 weeks for anxiety. If she continues to express that she does not feel her needs are met by this provider, then she may be better suited with another PCP. Detailed information on appropriate scheduling was provided to her today. I am unable to provide her with > 40 minutes office visits routinely. I am certainly willing to continue  working with her currently with appropriate follow ups and expectations.   Anxiety with depression - PHQ and GAD screening positive today (see above for details). Mood d/o screen negative.  - dicussed in quite a bit of detail today her positive screen and its affects on the body. She was tearful and emotional during visit and apologized for flux in emotions. She has great concerns about weight gain, therefore effexor would be a good fit to cover all her concerns, depression and anxiety.  - venlafaxine XR (EFFEXOR XR) 37.5 MG 24 hr capsule; 1 capsule (37.5 mg) for 7 days and then start 2 caps daily (75 mg)  Dispense: 49 capsule; Refill: 0  - F/U 4 weeks can consider increasing dose at that time if needed. She was counseled to never stop medications on her own, they will need tapered off.    Greater than 40 minutes spent with patient, >50% of time spent face to face    electronically signed by:  Howard Pouch, DO  [place]

## 2016-04-21 LAB — COMPLETE METABOLIC PANEL WITH GFR
ALBUMIN: 4.3 g/dL (ref 3.6–5.1)
ALK PHOS: 76 U/L (ref 33–115)
ALT: 9 U/L (ref 6–29)
AST: 14 U/L (ref 10–30)
BILIRUBIN TOTAL: 0.4 mg/dL (ref 0.2–1.2)
BUN: 11 mg/dL (ref 7–25)
CALCIUM: 8.8 mg/dL (ref 8.6–10.2)
CHLORIDE: 107 mmol/L (ref 98–110)
CO2: 21 mmol/L (ref 20–31)
CREATININE: 0.95 mg/dL (ref 0.50–1.10)
GFR, EST AFRICAN AMERICAN: 85 mL/min (ref >=60)
GFR, Est Non African American: 74 mL/min (ref >=60)
Glucose, Bld: 95 mg/dL (ref 65–99)
Potassium: 4.1 mmol/L (ref 3.5–5.3)
Sodium: 140 mmol/L (ref 135–146)
TOTAL PROTEIN: 7 g/dL (ref 6.1–8.1)

## 2016-04-21 LAB — IRON AND TIBC
%SAT: 5 % — AB (ref 11–50)
IRON: 18 ug/dL — AB (ref 40–190)
TIBC: 380 ug/dL (ref 250–450)
UIBC: 362 ug/dL (ref 125–400)

## 2016-04-21 LAB — VITAMIN D 25 HYDROXY (VIT D DEFICIENCY, FRACTURES): Vit D, 25-Hydroxy: 29 ng/mL — ABNORMAL LOW (ref 30–100)

## 2016-04-21 LAB — THYROID PANEL WITH TSH
Free Thyroxine Index: 1.3 — ABNORMAL LOW (ref 1.4–3.8)
T3 UPTAKE: 26 % (ref 22–35)
T4 TOTAL: 5 ug/dL (ref 4.5–12.0)
TSH: 6.32 mIU/L — ABNORMAL HIGH

## 2016-04-21 LAB — C-REACTIVE PROTEIN: CRP: 3.5 mg/L (ref <8.0)

## 2016-04-21 LAB — ANTI-DNA ANTIBODY, DOUBLE-STRANDED

## 2016-04-21 LAB — SEDIMENTATION RATE: SED RATE: 30 mm/h — AB (ref 0–20)

## 2016-04-22 ENCOUNTER — Encounter: Payer: Self-pay | Admitting: Family Medicine

## 2016-04-22 ENCOUNTER — Telehealth: Payer: Self-pay | Admitting: Family Medicine

## 2016-04-22 DIAGNOSIS — D509 Iron deficiency anemia, unspecified: Secondary | ICD-10-CM

## 2016-04-22 DIAGNOSIS — R140 Abdominal distension (gaseous): Secondary | ICD-10-CM

## 2016-04-22 DIAGNOSIS — N920 Excessive and frequent menstruation with regular cycle: Secondary | ICD-10-CM

## 2016-04-22 LAB — HISTONE ANTIBODIES, IGG, BLOOD

## 2016-04-22 NOTE — Telephone Encounter (Signed)
Patient was called this evening to discuss her labs. -Briefly she is iron deficient, and although not quite anemic again her hemoglobin and hematocrit is trending downwards. Patient will need to see her hematologist again for likely iron transfusion. - Her vitamin D is mildly low at 29, she was encouraged to continue her vitamin D supplementation and increasing the dose. -Her thyroid panel again confirm she is hypothyroid, she was strongly encouraged to continue to 25 mg of Synthroid and follow-up in 6-8 weeks to have TSH redrawn. -Discussed her hair loss and weight gain is likely secondary to her thyroid. Although if she would like to see her dermatologist for his opinion, I certainly support that decision as well. Especially since she's had a positive homogenous ANA, with negative antibodies. -  In addition thyroid disorder can cause heavier menstrual cycles, which could lead to more iron deficiency. However what she received reporting as her menstrual cycle, and confirmed today which is 1 day of heavy menses followed by 3 days of abnormal bleeding would not suggest such a discrepancy. Discussed with patient will order an ultrasound of her pelvis today to ensure her uterine lining is normal. - We will repeat her fecal occult blood test. Patient will pick up Monday morning and complete. Her last fecal occult blood test was negative. Discussed with patient possibility of needing GI referral to evaluate for either malabsorption and/or colonoscopy for iron deficiency anemia of unknown cause. Will await GI referral until after pelvic ultrasounds are completed in the event the blood loss is proven to be menstrual related.  - Patient is in agreement with plan.

## 2016-04-25 ENCOUNTER — Other Ambulatory Visit: Payer: Self-pay | Admitting: Family Medicine

## 2016-04-27 ENCOUNTER — Encounter: Payer: Self-pay | Admitting: Family Medicine

## 2016-04-28 ENCOUNTER — Encounter: Payer: Self-pay | Admitting: *Deleted

## 2016-04-28 ENCOUNTER — Telehealth: Payer: Self-pay | Admitting: Family Medicine

## 2016-04-28 NOTE — Telephone Encounter (Signed)
Patient calling requesting call back from Dr. Raoul Pitch.  She was just informed she has to pay $1500 upfront cost for iron testing.  She would like to discuss alternative options.

## 2016-04-28 NOTE — Telephone Encounter (Signed)
She should be encouraged to talk to her hematologist, which she is already established, concerning that matter. She has iron deficiency anemia, failed oral therapy, and with symptoms. She has had transfusions this year, that were covered. I do not have the answers on why it would cost her $1500 to have it again.  This is something she can only get through hematology and not offered in a primary care setting. Unfortunately,  there are no other options. Again she should discuss with heme, they may be able to help her figure out why it would cost her so much this time.

## 2016-04-28 NOTE — Telephone Encounter (Signed)
Sent message in my chart.

## 2016-04-29 ENCOUNTER — Telehealth: Payer: Self-pay | Admitting: Family Medicine

## 2016-04-29 DIAGNOSIS — D508 Other iron deficiency anemias: Secondary | ICD-10-CM

## 2016-04-29 DIAGNOSIS — E559 Vitamin D deficiency, unspecified: Secondary | ICD-10-CM

## 2016-04-29 DIAGNOSIS — L659 Nonscarring hair loss, unspecified: Secondary | ICD-10-CM

## 2016-04-29 DIAGNOSIS — R5383 Other fatigue: Secondary | ICD-10-CM

## 2016-04-29 NOTE — Telephone Encounter (Signed)
I will place a GI referral for her. I can not say I agree with her decision to wait on the [place], but it is her choice. If she would like to try at [facility] they can change the location of service and try there.  I do not believe the increase cost is secondary to location but secondary to her insurance deductible.  She also should complete the FOBT cards we ordered last week.

## 2016-04-29 NOTE — Telephone Encounter (Signed)
Pt is concerned about the $1500 cost for the US's. I explained that the cost was probably higher due to being scheduled at he [place]. She will call hematology and make an appointment with them. She would also like a referral to GI as well. If they are unable to determine root cause she will proceed with the US's but would like them scheduled at [place].  Pt apologized for being complicated and expressed thanks for the care you provide her.

## 2016-05-01 ENCOUNTER — Encounter (HOSPITAL_BASED_OUTPATIENT_CLINIC_OR_DEPARTMENT_OTHER): Payer: Federal, State, Local not specified - PPO

## 2016-05-01 ENCOUNTER — Ambulatory Visit (HOSPITAL_BASED_OUTPATIENT_CLINIC_OR_DEPARTMENT_OTHER): Payer: Federal, State, Local not specified - PPO

## 2016-05-02 ENCOUNTER — Telehealth: Payer: Self-pay | Admitting: *Deleted

## 2016-05-02 ENCOUNTER — Telehealth: Payer: Self-pay | Admitting: Hematology

## 2016-05-02 ENCOUNTER — Other Ambulatory Visit: Payer: Self-pay | Admitting: Hematology and Oncology

## 2016-05-02 NOTE — Telephone Encounter (Signed)
sw pt to confirm 12/22 appt date/time per pt request

## 2016-05-02 NOTE — Telephone Encounter (Signed)
Pt left VM requesting to be scheduled for IV iron before the holidays.  She has been scheduled, but mistakenly w/ Dr. Burr Medico instead of Dr. Alvy Bimler.  Sent urgent message to scheduling to r/s to see Dr. Alvy Bimler this Wednesday w/ lab and IV Feraheme.   Can do Thursday if pt cannot make it on Wednesday.

## 2016-05-02 NOTE — Telephone Encounter (Signed)
Left message for patient to return call regarding referral .

## 2016-05-04 ENCOUNTER — Ambulatory Visit: Payer: Federal, State, Local not specified - PPO

## 2016-05-04 ENCOUNTER — Ambulatory Visit: Payer: Federal, State, Local not specified - PPO | Admitting: Hematology and Oncology

## 2016-05-05 ENCOUNTER — Ambulatory Visit: Payer: Federal, State, Local not specified - PPO

## 2016-05-05 ENCOUNTER — Other Ambulatory Visit: Payer: Federal, State, Local not specified - PPO

## 2016-05-05 ENCOUNTER — Ambulatory Visit (HOSPITAL_BASED_OUTPATIENT_CLINIC_OR_DEPARTMENT_OTHER): Payer: Federal, State, Local not specified - PPO | Admitting: Hematology and Oncology

## 2016-05-05 ENCOUNTER — Encounter: Payer: Self-pay | Admitting: Hematology and Oncology

## 2016-05-05 ENCOUNTER — Other Ambulatory Visit (HOSPITAL_BASED_OUTPATIENT_CLINIC_OR_DEPARTMENT_OTHER): Payer: Federal, State, Local not specified - PPO

## 2016-05-05 ENCOUNTER — Telehealth: Payer: Self-pay | Admitting: Hematology and Oncology

## 2016-05-05 ENCOUNTER — Ambulatory Visit (HOSPITAL_BASED_OUTPATIENT_CLINIC_OR_DEPARTMENT_OTHER): Payer: Federal, State, Local not specified - PPO

## 2016-05-05 ENCOUNTER — Ambulatory Visit: Payer: Federal, State, Local not specified - PPO | Admitting: Hematology

## 2016-05-05 VITALS — BP 134/83 | HR 75 | Temp 98.8°F | Resp 16

## 2016-05-05 DIAGNOSIS — D509 Iron deficiency anemia, unspecified: Secondary | ICD-10-CM

## 2016-05-05 DIAGNOSIS — D508 Other iron deficiency anemias: Secondary | ICD-10-CM | POA: Diagnosis not present

## 2016-05-05 DIAGNOSIS — E559 Vitamin D deficiency, unspecified: Secondary | ICD-10-CM | POA: Diagnosis not present

## 2016-05-05 LAB — CBC & DIFF AND RETIC
BASO%: 0.5 % (ref 0.0–2.0)
BASOS ABS: 0 10*3/uL (ref 0.0–0.1)
EOS%: 2.4 % (ref 0.0–7.0)
Eosinophils Absolute: 0.2 10*3/uL (ref 0.0–0.5)
HEMATOCRIT: 33.6 % — AB (ref 34.8–46.6)
HGB: 10.8 g/dL — ABNORMAL LOW (ref 11.6–15.9)
Immature Retic Fract: 14.2 % — ABNORMAL HIGH (ref 1.60–10.00)
LYMPH%: 23 % (ref 14.0–49.7)
MCH: 27.6 pg (ref 25.1–34.0)
MCHC: 32.1 g/dL (ref 31.5–36.0)
MCV: 85.9 fL (ref 79.5–101.0)
MONO#: 0.7 10*3/uL (ref 0.1–0.9)
MONO%: 8.4 % (ref 0.0–14.0)
NEUT%: 65.7 % (ref 38.4–76.8)
NEUTROS ABS: 5.4 10*3/uL (ref 1.5–6.5)
Platelets: 347 10*3/uL (ref 145–400)
RBC: 3.91 10*6/uL (ref 3.70–5.45)
RDW: 13.8 % (ref 11.2–14.5)
RETIC %: 1.79 % (ref 0.70–2.10)
Retic Ct Abs: 69.99 10*3/uL (ref 33.70–90.70)
WBC: 8.2 10*3/uL (ref 3.9–10.3)
lymph#: 1.9 10*3/uL (ref 0.9–3.3)

## 2016-05-05 MED ORDER — SODIUM CHLORIDE 0.9 % IV SOLN
Freq: Once | INTRAVENOUS | Status: AC
  Administered 2016-05-05: 16:00:00 via INTRAVENOUS

## 2016-05-05 MED ORDER — SODIUM CHLORIDE 0.9 % IV SOLN
510.0000 mg | Freq: Once | INTRAVENOUS | Status: AC
  Administered 2016-05-05: 510 mg via INTRAVENOUS
  Filled 2016-05-05: qty 17

## 2016-05-05 NOTE — Patient Instructions (Signed)

## 2016-05-05 NOTE — Telephone Encounter (Signed)
Gave patient avs report and appointments for January and April

## 2016-05-06 ENCOUNTER — Other Ambulatory Visit: Payer: Federal, State, Local not specified - PPO

## 2016-05-06 ENCOUNTER — Ambulatory Visit: Payer: Federal, State, Local not specified - PPO

## 2016-05-06 ENCOUNTER — Encounter: Payer: Self-pay | Admitting: Hematology and Oncology

## 2016-05-06 ENCOUNTER — Ambulatory Visit: Payer: Federal, State, Local not specified - PPO | Admitting: Hematology

## 2016-05-06 LAB — FERRITIN: Ferritin: 4 ng/ml — ABNORMAL LOW (ref 9–269)

## 2016-05-06 NOTE — Assessment & Plan Note (Signed)
The most likely cause of her anemia is due to chronic blood loss/malabsorption syndrome. We discussed some of the risks, benefits, and alternatives of intravenous iron infusions. The patient is symptomatic from anemia and the iron level is critically low. She tolerated oral iron supplement poorly and desires to achieved higher levels of iron faster for adequate hematopoesis. Some of the side-effects to be expected including risks of infusion reactions, phlebitis, headaches, nausea and fatigue.  The patient is willing to proceed. Patient education material was dispensed.  Goal is to keep ferritin level greater than 50 I plan to redraw baseline CBC today along with iron studies. I will give HER-2 doses of IV iron and recheck in 3 months

## 2016-05-06 NOTE — Assessment & Plan Note (Signed)
She has symptoms of diffuse muscular pain and was diagnosed with probable fibromyalgia in the past. I recommend high-dose vitamin D replacement therapy

## 2016-05-06 NOTE — Progress Notes (Signed)
[place] OFFICE PROGRESS NOTE  Howard Pouch, DO SUMMARY OF HEMATOLOGIC HISTORY: Sydney Vaughn is here because of recent diagnosis of severe iron deficiency anemia  She was found to have abnormal CBC from recent blood work. Her baseline blood work in July 2015 was within normal limits. Most recently, she complained of profound fatigue. She had recent severe microcytic anemia with confirmed iron deficiency from blood work. She  had recent chest pain on exertion, shortness of breath on minimal exertion, pre-syncopal episodes, leg cramps and palpitations. She had not noticed any recent bleeding such as epistaxis, hematuria or hematochezia The patient denies over the counter NSAID ingestion. She is not on antiplatelets agents.  She had no prior history or diagnosis of cancer. Her age appropriate screening programs are up-to-date. She denies any pica and eats a variety of diet. She donated blood many years ago but has never received blood transfusion The patient was prescribed oral iron supplements and she takes 1 a day but that caused significant nausea, vomiting and abdominal bloating. She has 4 children and was told she was anemic in the past. The patient was placed on prednisone 2 years ago for possible autoimmune disorder but that was discontinued. She complained of profound fatigue and was recently diagnosed with hypothyroidism. She denies history of menorrhagia although she have passage of clots during menstruation several months ago. She was supposed she have vitamin D deficiency in the past but has not been placed on vitamin D supplement consistently She received 2 doses of intravenous iron in August 2017 INTERVAL HISTORY: Barbera Setters 40 y.o. female returns for further follow-up. She complained of fatigue and hair loss. She takes sporadic NSAID. Denies excessive menorrhagia. The patient denies any recent signs or symptoms of bleeding such as spontaneous epistaxis,  hematuria or hematochezia.   I have reviewed the past medical history, past surgical history, social history and family history with the patient and they are unchanged from previous note.  ALLERGIES:  has No Known Allergies.  MEDICATIONS:  Current Outpatient Prescriptions  Medication Sig Dispense Refill  . levothyroxine (LEVOTHROID) 25 MCG tablet Take 1 tablet (25 mcg total) by mouth daily before breakfast. 90 tablet 3  . Multiple Vitamin (MULTIVITAMIN) capsule Take 1 capsule by mouth daily.    Marland Kitchen venlafaxine XR (EFFEXOR XR) 37.5 MG 24 hr capsule 1 capsule (37.5 mg) for 7 days and then start 2 caps daily (75 mg) 49 capsule 0  . Biotin (BIOTIN 5000) 5 MG CAPS Take by mouth.    . FERROCITE 324 MG TABS tablet TAKE 1 TABLET (106 MG OF IRON TOTAL) BY MOUTH 2 (TWO) TIMES DAILY.  3   No current facility-administered medications for this visit.      REVIEW OF SYSTEMS:   Constitutional: Denies fevers, chills or night sweats Eyes: Denies blurriness of vision Ears, nose, mouth, throat, and face: Denies mucositis or sore throat Respiratory: Denies cough, dyspnea or wheezes Cardiovascular: Denies palpitation, chest discomfort or lower extremity swelling Gastrointestinal:  Denies nausea, heartburn or change in bowel habits Skin: Denies abnormal skin rashes Lymphatics: Denies new lymphadenopathy or easy bruising Neurological:Denies numbness, tingling or new weaknesses Behavioral/Psych: Mood is stable, no new changes  All other systems were reviewed with the patient and are negative.  PHYSICAL EXAMINATION: ECOG PERFORMANCE STATUS: 1 - Symptomatic but completely ambulatory  Vitals:   05/05/16 1505  BP: 129/75  Pulse: 86  Resp: 18  Temp: 98.5 F (36.9 C)   Filed Weights  05/05/16 1505  Weight: 198 lb 6.4 oz (90 kg)    GENERAL:alert, no distress and comfortable SKIN: skin color, texture, turgor are normal, no rashes or significant lesions EYES: normal, Conjunctiva are pink and  non-injected, sclera clear OROPHARYNX:no exudate, no erythema and lips, buccal mucosa, and tongue normal  NECK: supple, thyroid normal size, non-tender, without nodularity LYMPH:  no palpable lymphadenopathy in the cervical, axillary or inguinal LUNGS: clear to auscultation and percussion with normal breathing effort HEART: regular rate & rhythm and no murmurs and no lower extremity edema ABDOMEN:abdomen soft, non-tender and normal bowel sounds Musculoskeletal:no cyanosis of digits and no clubbing  NEURO: alert & oriented x 3 with fluent speech, no focal motor/sensory deficits  LABORATORY DATA:  I have reviewed the data as listed     Component Value Date/Time   NA 140 04/20/2016 1632   K 4.1 04/20/2016 1632   CL 107 04/20/2016 1632   CO2 21 04/20/2016 1632   GLUCOSE 95 04/20/2016 1632   BUN 11 04/20/2016 1632   CREATININE 0.95 04/20/2016 1632   CALCIUM 8.8 04/20/2016 1632   PROT 7.0 04/20/2016 1632   ALBUMIN 4.3 04/20/2016 1632   AST 14 04/20/2016 1632   ALT 9 04/20/2016 1632   ALKPHOS 76 04/20/2016 1632   BILITOT 0.4 04/20/2016 1632   GFRNONAA 74 04/20/2016 1632   GFRAA 85 04/20/2016 1632    No results found for: SPEP, UPEP  Lab Results  Component Value Date   WBC 8.2 05/05/2016   NEUTROABS 5.4 05/05/2016   HGB 10.8 (L) 05/05/2016   HCT 33.6 (L) 05/05/2016   MCV 85.9 05/05/2016   PLT 347 05/05/2016      Chemistry      Component Value Date/Time   NA 140 04/20/2016 1632   K 4.1 04/20/2016 1632   CL 107 04/20/2016 1632   CO2 21 04/20/2016 1632   BUN 11 04/20/2016 1632   CREATININE 0.95 04/20/2016 1632      Component Value Date/Time   CALCIUM 8.8 04/20/2016 1632   ALKPHOS 76 04/20/2016 1632   AST 14 04/20/2016 1632   ALT 9 04/20/2016 1632   BILITOT 0.4 04/20/2016 1632       ASSESSMENT & PLAN:  Anemia, iron deficiency The most likely cause of her anemia is due to chronic blood loss/malabsorption syndrome. We discussed some of the risks, benefits, and  alternatives of intravenous iron infusions. The patient is symptomatic from anemia and the iron level is critically low. She tolerated oral iron supplement poorly and desires to achieved higher levels of iron faster for adequate hematopoesis. Some of the side-effects to be expected including risks of infusion reactions, phlebitis, headaches, nausea and fatigue.  The patient is willing to proceed. Patient education material was dispensed.  Goal is to keep ferritin level greater than 50 I plan to redraw baseline CBC today along with iron studies. I will give HER-2 doses of IV iron and recheck in 3 months  Vitamin D deficiency She has symptoms of diffuse muscular pain and was diagnosed with probable fibromyalgia in the past. I recommend high-dose vitamin D replacement therapy   No orders of the defined types were placed in this encounter.   All questions were answered. The patient knows to call the clinic with any problems, questions or concerns. No barriers to learning was detected.  I spent 15 minutes counseling the patient face to face. The total time spent in the appointment was 20 minutes and more than 50% was on counseling.  Heath Lark, MD 12/22/20179:53 AM

## 2016-05-11 ENCOUNTER — Telehealth: Payer: Self-pay | Admitting: *Deleted

## 2016-05-11 NOTE — Telephone Encounter (Signed)
Pt called for lab results from last week.  Informed her of low Ferritin and Hbg which was not unexpected.  Pt received IV iron last week and scheduled for second dose of iron this Friday.   Instructed pt to keep appt for IV iron this Friday as scheduled.  Next appts in April but call us sooner if any problems or concerns.  Pt verbalized understanding.

## 2016-05-12 DIAGNOSIS — R5383 Other fatigue: Secondary | ICD-10-CM | POA: Diagnosis not present

## 2016-05-12 DIAGNOSIS — J40 Bronchitis, not specified as acute or chronic: Secondary | ICD-10-CM | POA: Diagnosis not present

## 2016-05-12 DIAGNOSIS — G44209 Tension-type headache, unspecified, not intractable: Secondary | ICD-10-CM | POA: Diagnosis not present

## 2016-05-12 DIAGNOSIS — Z01818 Encounter for other preprocedural examination: Secondary | ICD-10-CM | POA: Diagnosis not present

## 2016-05-12 DIAGNOSIS — R0602 Shortness of breath: Secondary | ICD-10-CM | POA: Diagnosis not present

## 2016-05-20 ENCOUNTER — Other Ambulatory Visit: Payer: Self-pay | Admitting: Family Medicine

## 2016-05-20 ENCOUNTER — Encounter: Payer: Self-pay | Admitting: Family Medicine

## 2016-05-20 ENCOUNTER — Ambulatory Visit (HOSPITAL_BASED_OUTPATIENT_CLINIC_OR_DEPARTMENT_OTHER): Payer: Federal, State, Local not specified - PPO

## 2016-05-20 VITALS — BP 120/86 | HR 79 | Temp 98.9°F | Resp 16

## 2016-05-20 DIAGNOSIS — D508 Other iron deficiency anemias: Secondary | ICD-10-CM | POA: Diagnosis not present

## 2016-05-20 DIAGNOSIS — F418 Other specified anxiety disorders: Secondary | ICD-10-CM

## 2016-05-20 MED ORDER — SODIUM CHLORIDE 0.9 % IV SOLN
510.0000 mg | Freq: Once | INTRAVENOUS | Status: AC
  Administered 2016-05-20: 510 mg via INTRAVENOUS
  Filled 2016-05-20: qty 17

## 2016-05-20 MED ORDER — SODIUM CHLORIDE 0.9 % IV SOLN
Freq: Once | INTRAVENOUS | Status: AC
  Administered 2016-05-20: 16:00:00 via INTRAVENOUS

## 2016-05-20 MED ORDER — VENLAFAXINE HCL ER 75 MG PO CP24: 75.0000 mg | ORAL_CAPSULE | Freq: Every day | ORAL | 0 refills | Status: DC

## 2016-05-20 NOTE — Patient Instructions (Signed)
Ferumoxytol injection What is this medicine? FERUMOXYTOL is an iron complex. Iron is used to make healthy red blood cells, which carry oxygen and nutrients throughout the body. This medicine is used to treat iron deficiency anemia in people with chronic kidney disease. COMMON BRAND NAME(S): Feraheme What should I tell my health care provider before I take this medicine? They need to know if you have any of these conditions: -anemia not caused by low iron levels -high levels of iron in the blood -magnetic resonance imaging (MRI) test scheduled -an unusual or allergic reaction to iron, other medicines, foods, dyes, or preservatives -pregnant or trying to get pregnant -breast-feeding How should I use this medicine? This medicine is for injection into a vein. It is given by a health care professional in a hospital or clinic setting. Talk to your pediatrician regarding the use of this medicine in children. Special care may be needed. What if I miss a dose? It is important not to miss your dose. Call your doctor or health care professional if you are unable to keep an appointment. What may interact with this medicine? This medicine may interact with the following medications: -other iron products What should I watch for while using this medicine? Visit your doctor or healthcare professional regularly. Tell your doctor or healthcare professional if your symptoms do not start to get better or if they get worse. You may need blood work done while you are taking this medicine. You may need to follow a special diet. Talk to your doctor. Foods that contain iron include: whole grains/cereals, dried fruits, beans, or peas, leafy green vegetables, and organ meats (liver, kidney). What side effects may I notice from receiving this medicine? Side effects that you should report to your doctor or health care professional as soon as possible: -allergic reactions like skin rash, itching or hives, swelling of the  face, lips, or tongue -breathing problems -changes in blood pressure -feeling faint or lightheaded, falls -fever or chills -flushing, sweating, or hot feelings -swelling of the ankles or feet Side effects that usually do not require medical attention (report to your doctor or health care professional if they continue or are bothersome): -diarrhea -headache -nausea, vomiting -stomach pain Where should I keep my medicine? This drug is given in a hospital or clinic and will not be stored at home.  2017 Elsevier/Gold Standard (2015-06-04 12:41:49)  

## 2016-05-20 NOTE — Progress Notes (Signed)
14 day refill provided to pt. She needs an appt. She did not schedule follow up after starting new medication as discussed, written on AVS and agreed to during her visit.

## 2016-05-23 ENCOUNTER — Telehealth: Payer: Self-pay | Admitting: Family Medicine

## 2016-05-23 ENCOUNTER — Encounter: Payer: Self-pay | Admitting: Family Medicine

## 2016-05-23 NOTE — Telephone Encounter (Signed)
Patient dismissed from [facility] by Howard Pouch DO , effective May 23, 2016. Dismissal letter sent out by certified / registered mail.  DAJ

## 2016-06-06 ENCOUNTER — Telehealth: Payer: Self-pay | Admitting: Family Medicine

## 2016-06-06 ENCOUNTER — Ambulatory Visit: Payer: Federal, State, Local not specified - PPO | Admitting: Family Medicine

## 2016-06-06 MED ORDER — VENLAFAXINE HCL ER 75 MG PO CP24: 75.0000 mg | ORAL_CAPSULE | Freq: Every day | ORAL | 0 refills | Status: DC

## 2016-06-06 NOTE — Telephone Encounter (Signed)
Effexor 75 mg refilled for 30 day supply. Patient has been dismissed from [facility] and will need to establish with another PCP for further refills.

## 2016-06-06 NOTE — Telephone Encounter (Signed)
I called patient and advised that we have been attempting to send her registered mail re: being dismissed. She felt that it was not warranted and asked who she could talk with about being dismissed from all [facility]. I gave her the number for [facility]. I did advise that we would call in her refill to [facility] for a 30 day supply.

## 2016-06-20 NOTE — Telephone Encounter (Signed)
Certified dismissal letter returned as undeliverable, unclaimed, return to sender after three attempts by [employer] on June 20, 2016 Letter placed in another envelope and resent as 1st class mail which does not require a signature. DAJ

## 2016-07-09 ENCOUNTER — Ambulatory Visit (INDEPENDENT_AMBULATORY_CARE_PROVIDER_SITE_OTHER): Payer: Federal, State, Local not specified - PPO | Admitting: Emergency Medicine

## 2016-07-09 VITALS — BP 124/84 | HR 90 | Temp 98.9°F | Resp 16 | Ht 64.5 in | Wt 191.0 lb

## 2016-07-09 DIAGNOSIS — E039 Hypothyroidism, unspecified: Secondary | ICD-10-CM

## 2016-07-09 DIAGNOSIS — L659 Nonscarring hair loss, unspecified: Secondary | ICD-10-CM

## 2016-07-09 DIAGNOSIS — D509 Iron deficiency anemia, unspecified: Secondary | ICD-10-CM

## 2016-07-09 DIAGNOSIS — F411 Generalized anxiety disorder: Secondary | ICD-10-CM

## 2016-07-09 MED ORDER — VENLAFAXINE HCL ER 75 MG PO CP24: 75.0000 mg | ORAL_CAPSULE | Freq: Every day | ORAL | 5 refills | Status: DC

## 2016-07-09 NOTE — Patient Instructions (Addendum)
     IF you received an x-ray today, you will receive an invoice from [facility]. Please contact [facility] at [phone] with questions or concerns regarding your invoice.   IF you received labwork today, you will receive an invoice from [facility]. Please contact [facility] at [phone] with questions or concerns regarding your invoice.   Our billing staff will not be able to assist you with questions regarding bills from these companies.  You will be contacted with the lab results as soon as they are available. The fastest way to get your results is to activate your My Chart account. Instructions are located on the last page of this paperwork. If you have not heard from [place] regarding the results in 2 weeks, please contact this office.      Hypothyroidism Hypothyroidism is a disorder of the thyroid. The thyroid is a large gland that is located in the lower front of the neck. The thyroid releases hormones that control how the body works. With hypothyroidism, the thyroid does not make enough of these hormones. What are the causes? Causes of hypothyroidism may include:  Viral infections.  Pregnancy.  Your own defense system (immune system) attacking your thyroid.  Certain medicines.  Birth defects.  Past radiation treatments to your head or neck.  Past treatment with radioactive iodine.  Past surgical removal of part or all of your thyroid.  Problems with the gland that is located in the center of your brain (pituitary). What are the signs or symptoms? Signs and symptoms of hypothyroidism may include:  Feeling as though you have no energy (lethargy).  Inability to tolerate cold.  Weight gain that is not explained by a change in diet or exercise habits.  Dry skin.  Coarse hair.  Menstrual irregularity.  Slowing of thought processes.  Constipation.  Sadness or depression. How is this diagnosed? Your health care provider may diagnose  hypothyroidism with blood tests and ultrasound tests. How is this treated? Hypothyroidism is treated with medicine that replaces the hormones that your body does not make. After you begin treatment, it may take several weeks for symptoms to go away. Follow these instructions at home:  Take medicines only as directed by your health care provider.  If you start taking any new medicines, tell your health care provider.  Keep all follow-up visits as directed by your health care provider. This is important. As your condition improves, your dosage needs may change. You will need to have blood tests regularly so that your health care provider can watch your condition. Contact a health care provider if:  Your symptoms do not get better with treatment.  You are taking thyroid replacement medicine and:  You sweat excessively.  You have tremors.  You feel anxious.  You lose weight rapidly.  You cannot tolerate heat.  You have emotional swings.  You have diarrhea.  You feel weak. Get help right away if:  You develop chest pain.  You develop an irregular heartbeat.  You develop a rapid heartbeat. This information is not intended to replace advice given to you by your health care provider. Make sure you discuss any questions you have with your health care provider. Document Released: 05/02/2005 Document Revised: 10/08/2015 Document Reviewed: 09/17/2013 Elsevier Interactive Patient Education  2017 [employer].

## 2016-07-09 NOTE — Progress Notes (Signed)
Sydney Vaughn 41 y.o.   Chief Complaint  Patient presents with  . Establish Care    HISTORY OF PRESENT ILLNESS: This is a 41 y.o. female complaining of hair loss, anemia, thyroid disease, and anxiety. Chronic problems. MR reviewed and lab results d/w patient.  HPI   Prior to Admission medications   Medication Sig Start Date End Date Taking? Authorizing Provider  levothyroxine (LEVOTHROID) 25 MCG tablet Take 1 tablet (25 mcg total) by mouth daily before breakfast. 02/04/16  Yes Renee A Kuneff, DO  Multiple Vitamin (MULTIVITAMIN) capsule Take 1 capsule by mouth daily.   Yes Historical Provider, MD  venlafaxine XR (EFFEXOR XR) 75 MG 24 hr capsule Take 1 capsule (75 mg total) by mouth daily with breakfast. 07/09/16  Yes Horald Pollen, MD  Biotin (BIOTIN 5000) 5 MG CAPS Take by mouth.    Historical Provider, MD  FERROCITE 324 MG TABS tablet TAKE 1 TABLET (106 MG OF IRON TOTAL) BY MOUTH 2 (TWO) TIMES DAILY. 12/03/15   Historical Provider, MD    No Known Allergies  Patient Active Problem List   Diagnosis Date Noted  . Hair loss 04/20/2016  . Anxiety with depression 04/20/2016  . Vitamin D deficiency 12/14/2015  . Acquired hypothyroidism 12/14/2015  . Anemia, iron deficiency 12/03/2015  . Weight gain 12/03/2015  . Fatigue 12/02/2015  . Tinnitus of left ear 11/23/2015  . Positive ANA (antinuclear antibody) 03/18/2014  . BMI 32.0-32.9,adult 12/11/2013    Past Medical History:  Diagnosis Date  . Adult acne   . ANA positive   . Anemia   . Asthma   . Chronic sinusitis    CT and MRI.   Marland Kitchen Iron deficiency   . Polyarthralgia    was seeing rheumatology    Past Surgical History:  Procedure Laterality Date  . APPENDECTOMY    . TUBAL LIGATION      Social History   Social History  . Marital status: Married    Spouse name: N/A  . Number of children: 4  . Years of education: N/A   Occupational History  .  [employer]    administration   Social History Main  Topics  . Smoking status: Never Smoker  . Smokeless tobacco: Never Used  . Alcohol use No  . Drug use: No  . Sexual activity: Yes    Birth control/ protection: Surgical   Other Topics Concern  . Not on file   Social History Narrative   Ms. Nowaczyk lives in [place] with her husband and children. She has 4 daughters: ages ranging from 16 yo- 15 yo. 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      Family History  Problem Relation Age of Onset  . Thyroid disease Mother   . Sinusitis Sister   . Sinusitis Brother   . Lupus Daughter     treated at [facility]  . Allergies Daughter      Review of Systems  Constitutional: Positive for malaise/fatigue. Negative for chills and fever.       Weight gain  HENT: Negative for congestion, ear pain, hearing loss, nosebleeds and sore throat.   Eyes: Negative for blurred vision, double vision and pain.  Respiratory: Negative for cough, hemoptysis, shortness of breath and wheezing.   Cardiovascular: Negative for chest pain, palpitations and leg swelling.  Gastrointestinal: Negative for abdominal pain, diarrhea, nausea and vomiting.  Genitourinary: Negative for dysuria and hematuria.  Musculoskeletal: Positive for  joint pain (multiple). Negative for myalgias.  Skin: Negative for rash.  Neurological: Negative for dizziness, tingling, sensory change, focal weakness and headaches.  Endo/Heme/Allergies: Negative for polydipsia. Does not bruise/bleed easily.       +hair loss  Psychiatric/Behavioral: The patient is nervous/anxious.   All other systems reviewed and are negative.  Vitals:   07/09/16 0957  BP: 124/84  Pulse: 90  Resp: 16  Temp: 98.9 F (37.2 C)     Physical Exam  Constitutional: She is oriented to person, place, and time. She appears well-developed and well-nourished.  HENT:  Head: Normocephalic and atraumatic.  Nose: Nose normal.  Mouth/Throat: Oropharynx is clear and moist. No  oropharyngeal exudate.  Eyes: Conjunctivae and EOM are normal. Pupils are equal, round, and reactive to light.  Neck: Normal range of motion. Neck supple. No JVD present. No thyromegaly present.  Cardiovascular: Normal rate, regular rhythm, normal heart sounds and intact distal pulses.   Pulmonary/Chest: Effort normal and breath sounds normal. She has no wheezes. She has no rales.  Abdominal: Soft. Bowel sounds are normal. She exhibits no distension. There is no tenderness.  Musculoskeletal: Normal range of motion.  Lymphadenopathy:    She has no cervical adenopathy.  Neurological: She is alert and oriented to person, place, and time. No sensory deficit. She exhibits normal muscle tone.  Skin: Skin is warm and dry. Capillary refill takes less than 2 seconds.  Psychiatric: She has a normal mood and affect. Her behavior is normal.  Vitals reviewed.    ASSESSMENT & PLAN: [place] was seen today for establish care.  Diagnoses and all orders for this visit:  Hypothyroidism, unspecified type -     Comprehensive metabolic panel -     TSH -     Ambulatory referral to Endocrinology  Hair loss -     Cancel: ANA,IFA RA Diag Pnl w/rflx Tit/Patn -     ANA  Iron deficiency anemia, unspecified iron deficiency anemia type -     CBC with Differential/Platelet  GAD (generalized anxiety disorder)  Other orders -     venlafaxine XR (EFFEXOR XR) 75 MG 24 hr capsule; Take 1 capsule (75 mg total) by mouth daily with breakfast.    Patient Instructions       IF you received an x-ray today, you will receive an invoice from [facility]. Please contact [facility] at [phone] with questions or concerns regarding your invoice.   IF you received labwork today, you will receive an invoice from [facility]. Please contact [facility] at [phone] with questions or concerns regarding your invoice.   Our billing staff will not be able to assist you with questions regarding bills from  these companies.  You will be contacted with the lab results as soon as they are available. The fastest way to get your results is to activate your My Chart account. Instructions are located on the last page of this paperwork. If you have not heard from [place] regarding the results in 2 weeks, please contact this office.      Hypothyroidism Hypothyroidism is a disorder of the thyroid. The thyroid is a large gland that is located in the lower front of the neck. The thyroid releases hormones that control how the body works. With hypothyroidism, the thyroid does not make enough of these hormones. What are the causes? Causes of hypothyroidism may include:  Viral infections.  Pregnancy.  Your own defense system (immune system) attacking your thyroid.  Certain medicines.  Birth defects.  Past  radiation treatments to your head or neck.  Past treatment with radioactive iodine.  Past surgical removal of part or all of your thyroid.  Problems with the gland that is located in the center of your brain (pituitary). What are the signs or symptoms? Signs and symptoms of hypothyroidism may include:  Feeling as though you have no energy (lethargy).  Inability to tolerate cold.  Weight gain that is not explained by a change in diet or exercise habits.  Dry skin.  Coarse hair.  Menstrual irregularity.  Slowing of thought processes.  Constipation.  Sadness or depression. How is this diagnosed? Your health care provider may diagnose hypothyroidism with blood tests and ultrasound tests. How is this treated? Hypothyroidism is treated with medicine that replaces the hormones that your body does not make. After you begin treatment, it may take several weeks for symptoms to go away. Follow these instructions at home:  Take medicines only as directed by your health care provider.  If you start taking any new medicines, tell your health care provider.  Keep all follow-up visits as  directed by your health care provider. This is important. As your condition improves, your dosage needs may change. You will need to have blood tests regularly so that your health care provider can watch your condition. Contact a health care provider if:  Your symptoms do not get better with treatment.  You are taking thyroid replacement medicine and:  You sweat excessively.  You have tremors.  You feel anxious.  You lose weight rapidly.  You cannot tolerate heat.  You have emotional swings.  You have diarrhea.  You feel weak. Get help right away if:  You develop chest pain.  You develop an irregular heartbeat.  You develop a rapid heartbeat. This information is not intended to replace advice given to you by your health care provider. Make sure you discuss any questions you have with your health care provider. Document Released: 05/02/2005 Document Revised: 10/08/2015 Document Reviewed: 09/17/2013 Elsevier Interactive Patient Education  2017 Elsevier Inc.      Agustina Caroli, MD [facility]

## 2016-07-10 LAB — CBC WITH DIFFERENTIAL/PLATELET
BASOS ABS: 0 10*3/uL (ref 0.0–0.2)
Basos: 1 %
EOS (ABSOLUTE): 0.2 10*3/uL (ref 0.0–0.4)
EOS: 3 %
HEMATOCRIT: 36.7 % (ref 34.0–46.6)
Hemoglobin: 11.7 g/dL (ref 11.1–15.9)
IMMATURE GRANULOCYTES: 0 %
Immature Grans (Abs): 0 10*3/uL (ref 0.0–0.1)
LYMPHS ABS: 1.7 10*3/uL (ref 0.7–3.1)
Lymphs: 24 %
MCH: 29.6 pg (ref 26.6–33.0)
MCHC: 31.9 g/dL (ref 31.5–35.7)
MCV: 93 fL (ref 79–97)
MONOS ABS: 0.4 10*3/uL (ref 0.1–0.9)
Monocytes: 6 %
NEUTROS PCT: 66 %
Neutrophils Absolute: 4.6 10*3/uL (ref 1.4–7.0)
PLATELETS: 306 10*3/uL (ref 150–379)
RBC: 3.95 x10E6/uL (ref 3.77–5.28)
RDW: 18.9 % — AB (ref 12.3–15.4)
WBC: 7 10*3/uL (ref 3.4–10.8)

## 2016-07-10 LAB — COMPREHENSIVE METABOLIC PANEL
ALK PHOS: 81 IU/L (ref 39–117)
ALT: 14 IU/L (ref 0–32)
AST: 18 IU/L (ref 0–40)
Albumin/Globulin Ratio: 1.6 (ref 1.2–2.2)
Albumin: 4.1 g/dL (ref 3.5–5.5)
BUN/Creatinine Ratio: 10 (ref 9–23)
BUN: 7 mg/dL (ref 6–24)
Bilirubin Total: 0.4 mg/dL (ref 0.0–1.2)
CALCIUM: 8.5 mg/dL — AB (ref 8.7–10.2)
CO2: 22 mmol/L (ref 18–29)
CREATININE: 0.71 mg/dL (ref 0.57–1.00)
Chloride: 103 mmol/L (ref 96–106)
GFR calc Af Amer: 121 mL/min/{1.73_m2} (ref >59)
GFR, EST NON AFRICAN AMERICAN: 105 mL/min/{1.73_m2} (ref >59)
GLOBULIN, TOTAL: 2.6 g/dL (ref 1.5–4.5)
Glucose: 88 mg/dL (ref 65–99)
POTASSIUM: 4.4 mmol/L (ref 3.5–5.2)
SODIUM: 139 mmol/L (ref 134–144)
Total Protein: 6.7 g/dL (ref 6.0–8.5)

## 2016-07-10 LAB — TSH: TSH: 3.23 u[IU]/mL (ref 0.450–4.500)

## 2016-07-11 LAB — ANA

## 2016-07-12 LAB — ANA: ANA Titer 1: NEGATIVE

## 2016-07-12 LAB — SPECIMEN STATUS REPORT

## 2016-08-04 ENCOUNTER — Encounter: Payer: Self-pay | Admitting: Endocrinology

## 2016-08-04 ENCOUNTER — Ambulatory Visit (INDEPENDENT_AMBULATORY_CARE_PROVIDER_SITE_OTHER): Payer: Federal, State, Local not specified - PPO | Admitting: Endocrinology

## 2016-08-04 VITALS — BP 128/82 | HR 103 | Ht 65.0 in | Wt 196.0 lb

## 2016-08-04 DIAGNOSIS — Z6832 Body mass index (BMI) 32.0-32.9, adult: Secondary | ICD-10-CM

## 2016-08-04 DIAGNOSIS — R5383 Other fatigue: Secondary | ICD-10-CM | POA: Diagnosis not present

## 2016-08-04 DIAGNOSIS — E063 Autoimmune thyroiditis: Secondary | ICD-10-CM

## 2016-08-04 DIAGNOSIS — E038 Other specified hypothyroidism: Secondary | ICD-10-CM | POA: Diagnosis not present

## 2016-08-04 DIAGNOSIS — E6609 Other obesity due to excess calories: Secondary | ICD-10-CM | POA: Diagnosis not present

## 2016-08-04 NOTE — Progress Notes (Signed)
Patient ID: Sydney Vaughn, female   DOB: [birth year], 41 y.o.   MRN: [id number]             Referring Physician: [place]  Reason for Appointment:  Hypothyroidism, new visit    History of Present Illness:   Hypothyroidism was first diagnosed in 11/2015  At the time of diagnosis patient was having symptoms of fatigue, weight gain and hair loss. She apparently has been having symptoms for several years the same way She has not had problems with cold intolerance except sometimes her feet get cold Baseline TSH was 7.7  She was started on levothyroxine 25 g daily However she did not feel any better with this and continued to have fatigue and hair loss.  This is despite her TSH being back to 1.6 a couple months later TSH was high in December because of her being off her medication since she was not sure if it was causing hair loss  Patient continues to have symptoms of fatigue, having difficulty losing weight and some hair loss although it is not as much She is periodically getting some hot flashes, not complaining about cold intolerance or dry skin She said that she gets exhausted after trying to exercise  She is mostly concerned about her lack of weight loss despite her exercising and following a good diet  Patient's weight history is as follows:   Wt Readings from Last 3 Encounters:  08/04/16 196 lb (88.9 kg)  07/09/16 191 lb (86.6 kg)  05/05/16 198 lb 6.4 oz (90 kg)    Thyroid function results have been as follows:  Lab Results  Component Value Date   FREET4 0.74 12/11/2013   TSH 3.230 07/09/2016   TSH 6.32 (H) 04/20/2016   TSH 1.63 02/03/2016      WEIGHT loss:  She thinks her weight had been in the 170s a couple of years ago and has gained weight which she cannot shed  Patient said that she has seen a dietitian in 2017 and is trying to modify her diet with taking salads for lunch, avoiding fried food usually She is trying to avoid snacking, trying to bake   foods as well as usually trying to avoid high-fat meats and avoiding sweets as well.  However she is usually trying to prepare food for her family with 4 children  She is trying to exercise daily on her [employer] equipment for 30 minutes on the last few weeks She had lost a little weight in February but has come back Her weight was about the same in July 2017    Past Medical History:  Diagnosis Date  . Adult acne   . ANA positive   . Anemia   . Asthma   . Chronic sinusitis    CT and MRI.   Marland Kitchen Iron deficiency   . Polyarthralgia    was seeing rheumatology    Past Surgical History:  Procedure Laterality Date  . APPENDECTOMY    . TUBAL LIGATION      Family History  Problem Relation Age of Onset  . Thyroid disease Mother   . Sinusitis Sister   . Sinusitis Brother   . Lupus Daughter     treated at [facility]  . Allergies Daughter     Social History:  reports that she has never smoked. She has never used smokeless tobacco. She reports that she does not drink alcohol or use drugs.  Allergies: No Known Allergies  Allergies as of 08/04/2016  No Known Allergies     Medication List       Accurate as of 08/04/16  4:29 PM. Always use your most recent med list.          levothyroxine 25 MCG tablet Commonly known as:  LEVOTHROID Take 1 tablet (25 mcg total) by mouth daily before breakfast.   multivitamin capsule Take 1 capsule by mouth daily.   venlafaxine XR 75 MG 24 hr capsule Commonly known as:  EFFEXOR XR Take 1 capsule (75 mg total) by mouth daily with breakfast.         Review of Systems  Constitutional: Negative for weight loss.  HENT: Negative for headaches.   Eyes: Negative for visual disturbance.  Respiratory: Negative for daytime sleepiness.   Cardiovascular: Negative for palpitations and leg swelling.       Sometimes may feel her heart going faster with anxiety  Gastrointestinal: Positive for constipation.  Endocrine: Positive for heat intolerance.         Her menses are somewhat longer and sometimes coming early  Genitourinary: Negative for frequency.  Musculoskeletal: Positive for muscle aches.  Skin:       Has had problems with acne for the last few years, now improving with creams prescribed by dermatologist  Psychiatric/Behavioral:       She has been on Effexor because of increased anxiety in 12/17.  She also has some depressed mood but she thinks this is related to lack of weight loss and problems with hair hair loss.  She is sleeping a little better now but is still a light sleeper               Examination:    BP 128/82   Pulse (!) 103   Ht 5' 5"  (1.651 m)   Wt 196 lb (88.9 kg)   SpO2 97%   BMI 32.62 kg/m   GENERAL:  Average build.  Mild generalized obesity present, no cushingoid features  No pallor, clubbing, lymphadenopathy or edema.    Skin:  no rash or pigmentation.  No significant acne, no alopecia or hirsutism  EYES:  No prominence of the eyes or swelling of the eyelids  ENT: Exam not indicated  THYROID:  Just palpable on the left side, soft and smooth.  HEART:  Repeat heart rate is 80.  Normal  S1 and S2; no murmur or click.  CHEST:    Lungs: Vescicular breath sounds heard equally.  No crepitations/ wheeze.  ABDOMEN:  No distention.  Liver and spleen not palpable.  No other mass or tenderness.  NEUROLOGICAL: Reflexes are normal to brisk bilaterally at biceps and ankles.  JOINTS:  Normal finger joints.   Assessment:  HYPOTHYROIDISM, mild with highest TSH 7.7 She has a small soft goiter on the left side Most likely she has autoimmune thyroid disease However her symptoms are out of proportion to her thyroid levels and not any better with taking thyroid supplementation and normalization of her TSH  Baseline free T4 and free T3 levels are not available  OBESITY: Patient is concerned about lack of weight loss despite diet and exercise regimen. Discussed that this is unrelated to her thyroid Offered  her consultation with dietitian but she does not want to do this  Acne without hirsutism and lack of alopecia: She also has normal menstrual cycles so doubt if she has PCOS  History of anxiety and depression: She still has some persistent depression despite taking Effexor, will defer to PCP  PLAN:  Check free T4 and free T3 levels, consider using combination of Cytomel and levothyroxine if needed  If her thyroid levels are quite normal she will be given a trial of phentermine 15 mg and Topamax 25 mg, her insurance is unlikely to be covering weight loss medicine such as Qsymia  Follow-up to be determined   [facility] 08/04/2016, 4:29 PM   Consultation note copy sent to the PCP  Note: This office note was prepared with Dragon voice recognition system technology. Any transcriptional errors that result from this process are unintentional.

## 2016-08-05 LAB — T3, FREE: T3, Free: 4 pg/mL (ref 2.3–4.2)

## 2016-08-05 LAB — T4, FREE: FREE T4: 0.68 ng/dL (ref 0.60–1.60)

## 2016-08-06 ENCOUNTER — Other Ambulatory Visit: Payer: Self-pay | Admitting: Endocrinology

## 2016-08-06 ENCOUNTER — Encounter: Payer: Self-pay | Admitting: Endocrinology

## 2016-08-08 MED ORDER — TOPIRAMATE 25 MG PO TABS: ORAL_TABLET | ORAL | 1 refills | Status: DC

## 2016-08-08 MED ORDER — PHENTERMINE HCL 15 MG PO CAPS: 15.0000 mg | ORAL_CAPSULE | ORAL | 1 refills | Status: DC

## 2016-08-09 ENCOUNTER — Telehealth: Payer: Self-pay | Admitting: Emergency Medicine

## 2016-08-09 NOTE — Telephone Encounter (Signed)
Patient called to get results from lab. Patient would like to speak directly to Dr. Dwyane Dee if possible. Please call patient back. Okay to leave a detailed message on work phone.

## 2016-08-09 NOTE — Telephone Encounter (Signed)
Message left for her to look at her my chart email and prescriptions that were sent

## 2016-08-26 ENCOUNTER — Other Ambulatory Visit: Payer: Self-pay | Admitting: Hematology and Oncology

## 2016-08-26 ENCOUNTER — Other Ambulatory Visit (HOSPITAL_BASED_OUTPATIENT_CLINIC_OR_DEPARTMENT_OTHER): Payer: Federal, State, Local not specified - PPO

## 2016-08-26 DIAGNOSIS — D509 Iron deficiency anemia, unspecified: Secondary | ICD-10-CM

## 2016-08-26 LAB — CBC & DIFF AND RETIC
BASO%: 1 % (ref 0.0–2.0)
Basophils Absolute: 0.1 10*3/uL (ref 0.0–0.1)
EOS%: 2.7 % (ref 0.0–7.0)
Eosinophils Absolute: 0.2 10*3/uL (ref 0.0–0.5)
HEMATOCRIT: 32.2 % — AB (ref 34.8–46.6)
HGB: 10.7 g/dL — ABNORMAL LOW (ref 11.6–15.9)
Immature Retic Fract: 17.3 % — ABNORMAL HIGH (ref 1.60–10.00)
LYMPH%: 31.4 % (ref 14.0–49.7)
MCH: 28.2 pg (ref 25.1–34.0)
MCHC: 33.2 g/dL (ref 31.5–36.0)
MCV: 85 fL (ref 79.5–101.0)
MONO#: 0.6 10*3/uL (ref 0.1–0.9)
MONO%: 9 % (ref 0.0–14.0)
NEUT#: 3.7 10*3/uL (ref 1.5–6.5)
NEUT%: 55.9 % (ref 38.4–76.8)
PLATELETS: 374 10*3/uL (ref 145–400)
RBC: 3.79 10*6/uL (ref 3.70–5.45)
RDW: 14.3 % (ref 11.2–14.5)
Retic %: 2.07 % (ref 0.70–2.10)
Retic Ct Abs: 78.45 10*3/uL (ref 33.70–90.70)
WBC: 6.7 10*3/uL (ref 3.9–10.3)
lymph#: 2.1 10*3/uL (ref 0.9–3.3)
nRBC: 0 % (ref 0–0)

## 2016-08-29 ENCOUNTER — Encounter: Payer: Self-pay | Admitting: Hematology and Oncology

## 2016-08-29 ENCOUNTER — Other Ambulatory Visit: Payer: Self-pay | Admitting: Hematology and Oncology

## 2016-08-29 LAB — FERRITIN: FERRITIN: 6 ng/mL — AB (ref 9–269)

## 2016-08-30 ENCOUNTER — Telehealth: Payer: Self-pay | Admitting: *Deleted

## 2016-08-30 NOTE — Telephone Encounter (Signed)
Due to infusion room scheduling & my clinic inavailability, if she cancel now, next available 5/1: I can see her at 1015 or 1115 followed by IV iron Let me know and I will place scheduling msg

## 2016-08-30 NOTE — Telephone Encounter (Signed)
Pt notified, will come see Dr Alvy Bimler 5/1 @ 1115 with infusion afterwards.

## 2016-08-30 NOTE — Telephone Encounter (Signed)
Pt has conflict this Friday and cannot come for MD/Feraheme.  Afternoons are best for appts

## 2016-08-31 ENCOUNTER — Encounter: Payer: Self-pay | Admitting: Hematology and Oncology

## 2016-09-02 ENCOUNTER — Ambulatory Visit: Payer: Federal, State, Local not specified - PPO

## 2016-09-02 ENCOUNTER — Ambulatory Visit: Payer: Federal, State, Local not specified - PPO | Admitting: Hematology and Oncology

## 2016-09-02 ENCOUNTER — Encounter: Payer: Self-pay | Admitting: Hematology and Oncology

## 2016-09-09 ENCOUNTER — Other Ambulatory Visit: Payer: Self-pay

## 2016-09-09 MED ORDER — TOPIRAMATE 25 MG PO TABS: ORAL_TABLET | ORAL | 1 refills | Status: DC

## 2016-09-09 MED ORDER — VENLAFAXINE HCL ER 75 MG PO CP24
75.0000 mg | ORAL_CAPSULE | Freq: Every day | ORAL | 1 refills | Status: DC
Start: 2016-09-09 — End: 2017-05-24

## 2016-09-09 NOTE — Progress Notes (Unsigned)
Fax req [facility] Venlafaxine HCL ER 44m  Request for 90 day rx.  Original was #30 with 5 refills Sent #90 with 1 refill

## 2016-09-13 ENCOUNTER — Ambulatory Visit: Payer: Federal, State, Local not specified - PPO | Admitting: Hematology and Oncology

## 2016-09-13 ENCOUNTER — Encounter: Payer: Self-pay | Admitting: Hematology and Oncology

## 2016-09-13 ENCOUNTER — Ambulatory Visit: Payer: Federal, State, Local not specified - PPO

## 2016-09-15 ENCOUNTER — Ambulatory Visit: Payer: Federal, State, Local not specified - PPO | Admitting: Endocrinology

## 2016-09-20 ENCOUNTER — Ambulatory Visit (HOSPITAL_BASED_OUTPATIENT_CLINIC_OR_DEPARTMENT_OTHER): Payer: Federal, State, Local not specified - PPO

## 2016-09-20 ENCOUNTER — Encounter (INDEPENDENT_AMBULATORY_CARE_PROVIDER_SITE_OTHER): Payer: Self-pay

## 2016-09-20 VITALS — BP 131/94 | HR 86 | Temp 98.9°F | Resp 20

## 2016-09-20 DIAGNOSIS — D508 Other iron deficiency anemias: Secondary | ICD-10-CM | POA: Diagnosis not present

## 2016-09-20 MED ORDER — FERUMOXYTOL INJECTION 510 MG/17 ML
510.0000 mg | Freq: Once | INTRAVENOUS | Status: AC
  Administered 2016-09-20: 510 mg via INTRAVENOUS
  Filled 2016-09-20: qty 17

## 2016-09-20 MED ORDER — SODIUM CHLORIDE 0.9 % IV SOLN
Freq: Once | INTRAVENOUS | Status: AC
  Administered 2016-09-20: 11:00:00 via INTRAVENOUS

## 2016-09-20 NOTE — Patient Instructions (Signed)

## 2016-10-12 ENCOUNTER — Encounter: Payer: Self-pay | Admitting: Endocrinology

## 2016-10-13 ENCOUNTER — Other Ambulatory Visit: Payer: Self-pay

## 2016-10-13 ENCOUNTER — Other Ambulatory Visit: Payer: Self-pay | Admitting: Endocrinology

## 2016-10-13 MED ORDER — PHENTERMINE HCL 15 MG PO CAPS
15.0000 mg | ORAL_CAPSULE | ORAL | 0 refills | Status: DC
Start: 2016-10-13 — End: 2016-11-17

## 2016-10-27 ENCOUNTER — Ambulatory Visit (INDEPENDENT_AMBULATORY_CARE_PROVIDER_SITE_OTHER): Payer: Federal, State, Local not specified - PPO | Admitting: Endocrinology

## 2016-10-27 ENCOUNTER — Encounter: Payer: Self-pay | Admitting: Endocrinology

## 2016-10-27 VITALS — BP 130/78 | HR 97 | Ht 65.0 in | Wt 178.2 lb

## 2016-10-27 DIAGNOSIS — Z6832 Body mass index (BMI) 32.0-32.9, adult: Secondary | ICD-10-CM

## 2016-10-27 DIAGNOSIS — E063 Autoimmune thyroiditis: Secondary | ICD-10-CM

## 2016-10-27 DIAGNOSIS — E669 Obesity, unspecified: Secondary | ICD-10-CM | POA: Diagnosis not present

## 2016-10-27 LAB — T4, FREE: Free T4: 0.67 ng/dL (ref 0.60–1.60)

## 2016-10-27 LAB — TSH: TSH: 3.87 u[IU]/mL (ref 0.35–4.50)

## 2016-10-27 LAB — T3, FREE: T3 FREE: 3.3 pg/mL (ref 2.3–4.2)

## 2016-10-27 MED ORDER — PHENTERMINE-TOPIRAMATE ER 11.25-69 MG PO CP24: ORAL_CAPSULE | ORAL | 3 refills | Status: DC

## 2016-10-27 NOTE — Patient Instructions (Signed)
Check Qsymia website

## 2016-10-27 NOTE — Progress Notes (Signed)
Patient ID: Sydney Vaughn, female   DOB: 1975/08/01, 41 y.o.   MRN: [id number]             Referring Physician: [place]  Reason for Appointment:  Follow-up    History of Present Illness:   WEIGHT management:  Previous history: She thinks her weight had been in the 170s a couple of years ago and has gained weight which she cannot shed Patient said that she has seen a dietitian in 2017 and is trying to modify her diet with taking salads for lunch, avoiding fried food usually She is trying to avoid snacking, trying to bake  foods as well as usually trying to avoid high-fat meats and avoiding sweets as well.  However she is usually trying to prepare food for her family with 4 children.  Her weight was about the same in July 2017  Since she had expressed difficulty with weight loss on her own with diet and exercise she was started on phentermine and Topamax on her initial visit about 3 months ago She has tolerated phentermine fairly well without any noticeable increase in heart rate or jitteriness She is not having any nausea or other side effects from Topamax She has been able to cut back on her portions and has progressively lost weight Also overall feels better She is trying to exercise daily on her [employer] equipment for at least 30 minutes  Wt Readings from Last 3 Encounters:  10/27/16 178 lb 3.2 oz (80.8 kg)  08/04/16 196 lb (88.9 kg)  07/09/16 191 lb (86.6 kg)      Hypothyroidism was first diagnosed in 11/2015  At the time of diagnosis patient was having symptoms of fatigue, weight gain and hair loss. She apparently has been having symptoms for several years the same way She has not had problems with cold intolerance except sometimes her feet get cold Baseline TSH was 7.7 and she was started on levothyroxine 25 g daily  On her initial consultation she was having symptoms of fatigue, having difficulty losing weight, fatigue on exercising and some hair loss  although not as much, this is despite her taking the levothyroxine Also her thyroid functions including free T3 were normal Her levothyroxine was continued unchanged  More recently she is feeling better with energy and exercise capacity especially with her losing weight as above  Thyroid function results have been as follows:  Lab Results  Component Value Date   FREET4 0.68 08/04/2016   FREET4 0.74 12/11/2013   TSH 3.230 07/09/2016   TSH 6.32 (H) 04/20/2016   TSH 1.63 02/03/2016        Past Medical History:  Diagnosis Date  . Adult acne   . ANA positive   . Anemia   . Asthma   . Chronic sinusitis    CT and MRI.   Marland Kitchen Iron deficiency   . Polyarthralgia    was seeing rheumatology    Past Surgical History:  Procedure Laterality Date  . APPENDECTOMY    . TUBAL LIGATION      Family History  Problem Relation Age of Onset  . Thyroid disease Mother   . Sinusitis Sister   . Sinusitis Brother   . Lupus Daughter        treated at [facility]  . Allergies Daughter     Social History:  reports that she has never smoked. She has never used smokeless tobacco. She reports that she does not drink alcohol or use drugs.  Allergies:  No Known Allergies  Allergies as of 10/27/2016   No Known Allergies     Medication List       Accurate as of 10/27/16  3:43 PM. Always use your most recent med list.          levothyroxine 25 MCG tablet Commonly known as:  LEVOTHROID Take 1 tablet (25 mcg total) by mouth daily before breakfast.   multivitamin capsule Take 1 capsule by mouth daily.   phentermine 15 MG capsule Take 1 capsule (15 mg total) by mouth every morning.   topiramate 25 MG tablet Commonly known as:  TOPAMAX 1 tablet with dinner for the first week and then twice a day   venlafaxine XR 75 MG 24 hr capsule Commonly known as:  EFFEXOR XR Take 1 capsule (75 mg total) by mouth daily with breakfast.         Review of Systems             Examination:    BP  130/78   Pulse 97   Ht 5' 5"  (1.651 m)   Wt 178 lb 3.2 oz (80.8 kg)   SpO2 99%   BMI 29.65 kg/m   Repeat pulse 92 regular  Biceps reflexes normal   Assessment: OBESITY:  With using phentermine and Topamax she has lost 18 pounds over the last 3 months Also doing better with consistent diet and exercise now She has no side effects with these and apparently her pulse is not any higher than before  HYPOTHYROIDISM, mild with highest TSH 7.7 She had a normal TSH with taking only 25 g levothyroxine Currently doing subjectively fairly well especially with her being able to lose weight   PLAN:   Check free T4 and TSH again  She will switch from phentermine and Topamax to taking the controlled release combination with Qsymia This appears to be covered by her insurance and she can also get a co-pay card online Since she is doing well with 15 mg of phentermine currently she will use the 11.25/69 dosage of Qsymia She will call if she has any issues with this Follow-up in 3 months again  [facility] 10/27/2016, 3:43 PM     Note: This office note was prepared with Dragon voice recognition system technology. Any transcriptional errors that result from this process are unintentional.   Addendum: TSH high normal with low normal free T4 and normal T3, she can try 37.5 g levothyroxine

## 2016-10-31 ENCOUNTER — Other Ambulatory Visit: Payer: Self-pay | Admitting: Endocrinology

## 2016-10-31 DIAGNOSIS — R7989 Other specified abnormal findings of blood chemistry: Secondary | ICD-10-CM

## 2016-10-31 MED ORDER — LEVOTHYROXINE SODIUM 25 MCG PO TABS: ORAL_TABLET | ORAL | 3 refills | Status: DC

## 2016-11-03 ENCOUNTER — Encounter: Payer: Self-pay | Admitting: Endocrinology

## 2016-11-10 DIAGNOSIS — R21 Rash and other nonspecific skin eruption: Secondary | ICD-10-CM | POA: Diagnosis not present

## 2016-11-10 DIAGNOSIS — M25552 Pain in left hip: Secondary | ICD-10-CM | POA: Diagnosis not present

## 2016-11-10 DIAGNOSIS — M25551 Pain in right hip: Secondary | ICD-10-CM | POA: Diagnosis not present

## 2016-11-16 ENCOUNTER — Other Ambulatory Visit: Payer: Self-pay | Admitting: Endocrinology

## 2016-11-17 ENCOUNTER — Other Ambulatory Visit: Payer: Self-pay

## 2016-11-17 MED ORDER — PHENTERMINE HCL 15 MG PO CAPS: 15.0000 mg | ORAL_CAPSULE | ORAL | 0 refills | Status: DC

## 2016-12-18 ENCOUNTER — Other Ambulatory Visit: Payer: Self-pay | Admitting: Endocrinology

## 2016-12-20 ENCOUNTER — Other Ambulatory Visit: Payer: Self-pay

## 2016-12-20 MED ORDER — PHENTERMINE HCL 15 MG PO CAPS: 15.0000 mg | ORAL_CAPSULE | ORAL | 0 refills | Status: DC

## 2016-12-21 ENCOUNTER — Other Ambulatory Visit: Payer: Self-pay | Admitting: Endocrinology

## 2016-12-21 ENCOUNTER — Encounter: Payer: Self-pay | Admitting: Hematology and Oncology

## 2016-12-21 ENCOUNTER — Encounter: Payer: Self-pay | Admitting: Endocrinology

## 2016-12-22 ENCOUNTER — Other Ambulatory Visit: Payer: Self-pay | Admitting: Hematology and Oncology

## 2016-12-22 ENCOUNTER — Telehealth: Payer: Self-pay | Admitting: *Deleted

## 2016-12-22 DIAGNOSIS — D508 Other iron deficiency anemias: Secondary | ICD-10-CM

## 2016-12-22 NOTE — Telephone Encounter (Signed)
Left message that Dr Alvy Bimler has placed an order for labs to be checked. Then she will schedule a follow up appt, possible infusion

## 2017-01-02 ENCOUNTER — Other Ambulatory Visit: Payer: Self-pay | Admitting: Endocrinology

## 2017-01-03 ENCOUNTER — Telehealth: Payer: Self-pay | Admitting: Endocrinology

## 2017-01-03 NOTE — Telephone Encounter (Signed)
Patient states that she needs her phentermine filled. It looks like it was filled yesterday and that option was print. Patient states that it is normally [facility]. Please call.

## 2017-01-03 NOTE — Telephone Encounter (Signed)
Routing to you °

## 2017-01-04 ENCOUNTER — Other Ambulatory Visit: Payer: Self-pay

## 2017-01-04 MED ORDER — PHENTERMINE HCL 15 MG PO CAPS: 15.0000 mg | ORAL_CAPSULE | Freq: Every morning | ORAL | 0 refills | Status: DC

## 2017-01-04 NOTE — Telephone Encounter (Signed)
Called patient and let her know that I have [facility] for her.

## 2017-01-09 ENCOUNTER — Other Ambulatory Visit: Payer: Self-pay

## 2017-01-09 MED ORDER — PHENTERMINE HCL 15 MG PO CAPS: 15.0000 mg | ORAL_CAPSULE | Freq: Every morning | ORAL | 0 refills | Status: DC

## 2017-01-27 ENCOUNTER — Telehealth: Payer: Self-pay | Admitting: Endocrinology

## 2017-01-27 NOTE — Telephone Encounter (Signed)
Patient discharged from practice?  There is a note coming up  In her chart of the Interactive face sheet but no documentation in her chart.  Patient going to [place] office on Monday the 17th to draw labs.  Please advise,  Ty,  -LL

## 2017-01-27 NOTE — Telephone Encounter (Signed)
Pt is not dismissed from the practice

## 2017-01-30 ENCOUNTER — Other Ambulatory Visit: Payer: Federal, State, Local not specified - PPO

## 2017-02-02 ENCOUNTER — Encounter: Payer: Self-pay | Admitting: Endocrinology

## 2017-02-02 ENCOUNTER — Ambulatory Visit (INDEPENDENT_AMBULATORY_CARE_PROVIDER_SITE_OTHER): Payer: Federal, State, Local not specified - PPO | Admitting: Endocrinology

## 2017-02-02 VITALS — BP 128/80 | HR 88 | Temp 98.7°F | Ht 63.0 in | Wt 175.8 lb

## 2017-02-02 DIAGNOSIS — D508 Other iron deficiency anemias: Secondary | ICD-10-CM | POA: Diagnosis not present

## 2017-02-02 DIAGNOSIS — E063 Autoimmune thyroiditis: Secondary | ICD-10-CM

## 2017-02-02 DIAGNOSIS — R946 Abnormal results of thyroid function studies: Secondary | ICD-10-CM | POA: Diagnosis not present

## 2017-02-02 DIAGNOSIS — R7989 Other specified abnormal findings of blood chemistry: Secondary | ICD-10-CM

## 2017-02-02 LAB — CBC WITH DIFFERENTIAL/PLATELET
BASOS ABS: 0.1 10*3/uL (ref 0.0–0.1)
BASOS PCT: 0.8 % (ref 0.0–3.0)
EOS ABS: 0.2 10*3/uL (ref 0.0–0.7)
Eosinophils Relative: 2.6 % (ref 0.0–5.0)
HEMATOCRIT: 27.8 % — AB (ref 36.0–46.0)
Hemoglobin: 8.4 g/dL — ABNORMAL LOW (ref 12.0–15.0)
LYMPHS ABS: 1.9 10*3/uL (ref 0.7–4.0)
Lymphocytes Relative: 27.2 % (ref 12.0–46.0)
MCHC: 30.1 g/dL (ref 30.0–36.0)
MCV: 71.5 fl — ABNORMAL LOW (ref 78.0–100.0)
MONO ABS: 0.7 10*3/uL (ref 0.1–1.0)
Monocytes Relative: 9.3 % (ref 3.0–12.0)
NEUTROS ABS: 4.2 10*3/uL (ref 1.4–7.7)
NEUTROS PCT: 60.1 % (ref 43.0–77.0)
PLATELETS: 424 10*3/uL — AB (ref 150.0–400.0)
RBC: 3.88 Mil/uL (ref 3.87–5.11)
RDW: 18.7 % — AB (ref 11.5–15.5)
WBC: 7.1 10*3/uL (ref 4.0–10.5)

## 2017-02-02 LAB — T4, FREE: FREE T4: 0.66 ng/dL (ref 0.60–1.60)

## 2017-02-02 LAB — TSH: TSH: 4.19 u[IU]/mL (ref 0.35–4.50)

## 2017-02-02 LAB — T3, FREE: T3, Free: 3.4 pg/mL (ref 2.3–4.2)

## 2017-02-02 NOTE — Patient Instructions (Signed)
Take both pills at lunch

## 2017-02-02 NOTE — Progress Notes (Signed)
Patient ID: Sydney Vaughn, female   DOB: [birth year], 41 y.o.   MRN: [id number]             Referring Physician: West  Reason for Appointment:  Follow-up    History of Present Illness:   WEIGHT management:  Previous history: She thinks her weight had been in the 170s a couple of years ago and has gained weight which she cannot shed Patient said that she has seen a dietitian in 2017 and is trying to modify her diet with taking salads for lunch, avoiding fried food usually She is trying to avoid snacking, trying to bake  foods as well as usually trying to avoid high-fat meats and avoiding sweets as well.  However she is usually trying to prepare food for her family with 4 children.  Her weight was about the same in July 2017  Since she had expressed difficulty with weight loss on her own with diet and exercise she was started on phentermine and Topamax on her initial visit in 07/2016   On her initial follow-up she had lost 18 pounds and had no side effects with the 2 drugs She was able to cut back on portions More recently however she has not been able to exercise and is having longer work hours and more stressed Exercising only about twice a week now for about 30 minutes She does think that she is generally watching her diet fairly well and not eating out much Has lost only 3 more pounds  Occasionally she will forget to take her Topamax which she normally takes after lunch and she has been separating this from the phentermine. Qsymia not covered by her insurance   Wt Readings from Last 3 Encounters:  02/02/17 175 lb 12.8 oz (79.7 kg)  10/27/16 178 lb 3.2 oz (80.8 kg)  08/04/16 196 lb (88.9 kg)    Hypothyroidism was first diagnosed in 11/2015  At the time of diagnosis patient was having symptoms of fatigue, weight gain and hair loss. She apparently has been having symptoms for several years the same way She has not had problems with cold intolerance except  sometimes her feet get cold Baseline TSH was 7.7 and she was started on levothyroxine 25 g daily  On her initial consultation she was having symptoms of fatigue, having difficulty losing weight, fatigue on exercising and some hair loss although not as much, this is despite her taking the levothyroxine Also her thyroid functions including free T3 were normal  More recently she is having some fatigue but she also has had this problem when she was having iron deficiency Also working longer hours  Thyroid function results have been as follows:  Lab Results  Component Value Date   FREET4 0.67 10/27/2016   FREET4 0.68 08/04/2016   FREET4 0.74 12/11/2013   TSH 3.87 10/27/2016   TSH 3.230 07/09/2016   TSH 6.32 (H) 04/20/2016        Past Medical History:  Diagnosis Date  . Adult acne   . ANA positive   . Anemia   . Asthma   . Chronic sinusitis    CT and MRI.   Marland Kitchen Iron deficiency   . Polyarthralgia    was seeing rheumatology    Past Surgical History:  Procedure Laterality Date  . APPENDECTOMY    . TUBAL LIGATION      Family History  Problem Relation Age of Onset  . Thyroid disease Mother   . Sinusitis Sister   .  Sinusitis Brother   . Lupus Daughter        treated at [facility]  . Allergies Daughter     Social History:  reports that she has never smoked. She has never used smokeless tobacco. She reports that she does not drink alcohol or use drugs.  Allergies: No Known Allergies  Allergies as of 02/02/2017   No Known Allergies     Medication List       Accurate as of 02/02/17  3:39 PM. Always use your most recent med list.          levothyroxine 25 MCG tablet Commonly known as:  LEVOTHROID 1-1/2 tablets daily   phentermine 15 MG capsule Take 1 capsule (15 mg total) by mouth every morning.   topiramate 25 MG tablet Commonly known as:  TOPAMAX 1 tablet with dinner for the first week and then twice a day   venlafaxine XR 75 MG 24 hr capsule Commonly  known as:  EFFEXOR XR Take 1 capsule (75 mg total) by mouth daily with breakfast.         Review of Systems             Examination:    BP 128/80 (BP Location: Left Arm, Patient Position: Sitting, Cuff Size: Normal)   Pulse 88   Temp 98.7 F (37.1 C) (Oral)   Ht 5' 3"  (1.6 m)   Wt 175 lb 12.8 oz (79.7 kg)   LMP 01/23/2017 (Exact Date)   SpO2 98%   BMI 31.14 kg/m    Thyroid not palpable   Assessment: OBESITY:  With using phentermine and Topamax she has kept her weight down although has not lost as much recently This may be related to her occasionally missing her Topamax doses and also not exercising as much  Anemia and iron deficiency: This may be causing her fatigue and will need to reassess this  HYPOTHYROIDISM, mild with highest TSH 7.7  Not clear if her fatigue is related to worsening hypothyroidism and will need to reassess, currently taking 37.5 g   PLAN:   Check free T4 and TSH again  She will Topamax and phentermine together at lunchtime She is tolerating these well and can continue these long-term Recommended increasing exercise  Follow-up in 3 months again  [facility] 02/02/2017, 3:39 PM     Note: This office note was prepared with Dragon voice recognition system technology. Any transcriptional errors that result from this process are unintentional.   Addendum: TSH high normal with low normal free T4 and normal T3, she will go up to 50 g levothyroxine Also her hemoglobin is 8.4 with microcytosis and she will contact hematologist

## 2017-02-03 ENCOUNTER — Encounter: Payer: Self-pay | Admitting: Endocrinology

## 2017-02-03 ENCOUNTER — Other Ambulatory Visit (INDEPENDENT_AMBULATORY_CARE_PROVIDER_SITE_OTHER): Payer: Federal, State, Local not specified - PPO

## 2017-02-03 DIAGNOSIS — D508 Other iron deficiency anemias: Secondary | ICD-10-CM | POA: Diagnosis not present

## 2017-02-03 LAB — IBC PANEL
Iron: <10 ug/dL — ABNORMAL LOW (ref 42–145)
Saturation Ratios: 2.2 % — ABNORMAL LOW (ref 20.0–50.0)
Transferrin: 332 mg/dL (ref 212.0–360.0)

## 2017-02-03 MED ORDER — LEVOTHYROXINE SODIUM 50 MCG PO TABS: ORAL_TABLET | ORAL | 1 refills | Status: DC

## 2017-02-06 ENCOUNTER — Encounter: Payer: Self-pay | Admitting: Hematology and Oncology

## 2017-02-06 ENCOUNTER — Other Ambulatory Visit: Payer: Self-pay | Admitting: Hematology and Oncology

## 2017-02-07 ENCOUNTER — Telehealth: Payer: Self-pay | Admitting: Hematology and Oncology

## 2017-02-07 ENCOUNTER — Other Ambulatory Visit: Payer: Self-pay | Admitting: Hematology and Oncology

## 2017-02-07 ENCOUNTER — Telehealth: Payer: Self-pay

## 2017-02-07 NOTE — Telephone Encounter (Signed)
Called patient back regarding mychart message. She said will take the 10-2 appt.

## 2017-02-07 NOTE — Telephone Encounter (Signed)
Scheduled appt per 9/24 sch message - patient is aware of appt date and time.

## 2017-02-07 NOTE — Telephone Encounter (Signed)
Spoke with patient concerning upcoming appointment.

## 2017-02-07 NOTE — Telephone Encounter (Signed)
I placed urgent msg to move it to 10/2

## 2017-02-11 DIAGNOSIS — R350 Frequency of micturition: Secondary | ICD-10-CM | POA: Diagnosis not present

## 2017-02-11 DIAGNOSIS — N898 Other specified noninflammatory disorders of vagina: Secondary | ICD-10-CM | POA: Diagnosis not present

## 2017-02-11 DIAGNOSIS — H9202 Otalgia, left ear: Secondary | ICD-10-CM | POA: Diagnosis not present

## 2017-02-14 ENCOUNTER — Telehealth: Payer: Self-pay | Admitting: Hematology and Oncology

## 2017-02-14 ENCOUNTER — Ambulatory Visit (HOSPITAL_BASED_OUTPATIENT_CLINIC_OR_DEPARTMENT_OTHER): Payer: Federal, State, Local not specified - PPO | Admitting: Hematology and Oncology

## 2017-02-14 ENCOUNTER — Ambulatory Visit (HOSPITAL_BASED_OUTPATIENT_CLINIC_OR_DEPARTMENT_OTHER): Payer: Federal, State, Local not specified - PPO

## 2017-02-14 ENCOUNTER — Other Ambulatory Visit (HOSPITAL_BASED_OUTPATIENT_CLINIC_OR_DEPARTMENT_OTHER): Payer: Federal, State, Local not specified - PPO

## 2017-02-14 DIAGNOSIS — D508 Other iron deficiency anemias: Secondary | ICD-10-CM

## 2017-02-14 LAB — CBC WITH DIFFERENTIAL/PLATELET
BASO%: 1 % (ref 0.0–2.0)
BASOS ABS: 0.1 10*3/uL (ref 0.0–0.1)
EOS ABS: 0.1 10*3/uL (ref 0.0–0.5)
EOS%: 2.3 % (ref 0.0–7.0)
HEMATOCRIT: 27.1 % — AB (ref 34.8–46.6)
HEMOGLOBIN: 8.3 g/dL — AB (ref 11.6–15.9)
LYMPH#: 1.3 10*3/uL (ref 0.9–3.3)
LYMPH%: 22.1 % (ref 14.0–49.7)
MCH: 20.9 pg — AB (ref 25.1–34.0)
MCHC: 30.5 g/dL — ABNORMAL LOW (ref 31.5–36.0)
MCV: 68.5 fL — ABNORMAL LOW (ref 79.5–101.0)
MONO#: 0.5 10*3/uL (ref 0.1–0.9)
MONO%: 8.9 % (ref 0.0–14.0)
NEUT#: 3.8 10*3/uL (ref 1.5–6.5)
NEUT%: 65.7 % (ref 38.4–76.8)
Platelets: 413 10*3/uL — ABNORMAL HIGH (ref 145–400)
RBC: 3.95 10*6/uL (ref 3.70–5.45)
RDW: 18.4 % — AB (ref 11.2–14.5)
WBC: 5.9 10*3/uL (ref 3.9–10.3)

## 2017-02-14 LAB — FERRITIN: Ferritin: <4 ng/ml — ABNORMAL LOW (ref 9–269)

## 2017-02-14 MED ORDER — SODIUM CHLORIDE 0.9 % IV SOLN
Freq: Once | INTRAVENOUS | Status: AC
  Administered 2017-02-14: 14:00:00 via INTRAVENOUS

## 2017-02-14 MED ORDER — SODIUM CHLORIDE 0.9 % IV SOLN
510.0000 mg | Freq: Once | INTRAVENOUS | Status: AC
  Administered 2017-02-14: 510 mg via INTRAVENOUS
  Filled 2017-02-14: qty 17

## 2017-02-14 NOTE — Patient Instructions (Signed)

## 2017-02-14 NOTE — Telephone Encounter (Signed)
Scheduled appt per 10/2 los - Gave patient AVS and calender per los.

## 2017-02-15 ENCOUNTER — Encounter: Payer: Self-pay | Admitting: Hematology and Oncology

## 2017-02-15 ENCOUNTER — Other Ambulatory Visit: Payer: Self-pay

## 2017-02-15 NOTE — Assessment & Plan Note (Signed)
The patient have recurrent, severe iron deficiency and missed treatments and follow-up The most likely cause of her anemia is due to chronic blood loss/malabsorption syndrome. We discussed some of the risks, benefits, and alternatives of intravenous iron infusions. The patient is symptomatic from anemia and the iron level is critically low. She tolerated oral iron supplement poorly and desires to achieved higher levels of iron faster for adequate hematopoesis. Some of the side-effects to be expected including risks of infusion reactions, phlebitis, headaches, nausea and fatigue.  The patient is willing to proceed. Patient education material was dispensed.  Goal is to keep ferritin level greater than 50 Recommend recheck blood count in the month We discussed further workup for possible menorrhagia and management such as hormonal manipulation I also recommend consideration for GI workup The patient is fearful that being on hormonal therapy will cause her to become menopausal I told her that is not the case After extensive discussion, she wants to proceed with intravenous iron related to GI follow-up for endoscopy or consideration for treatment for possible menorrhagia

## 2017-02-15 NOTE — Progress Notes (Signed)
[place] OFFICE PROGRESS NOTE  Sagardia, Ines Bloomer, MD SUMMARY OF HEMATOLOGIC HISTORY:  Sydney Vaughn is here because of recent diagnosis of severe iron deficiency anemia  She was found to have abnormal CBC from recent blood work. Her baseline blood work in July 2015 was within normal limits. Most recently, she complained of profound fatigue. She had recent severe microcytic anemia with confirmed iron deficiency from blood work. She  had recent chest pain on exertion, shortness of breath on minimal exertion, pre-syncopal episodes, leg cramps and palpitations. She had not noticed any recent bleeding such as epistaxis, hematuria or hematochezia The patient denies over the counter NSAID ingestion. She is not on antiplatelets agents.  She had no prior history or diagnosis of cancer. Her age appropriate screening programs are up-to-date. She denies any pica and eats a variety of diet. She donated blood many years ago but has never received blood transfusion The patient was prescribed oral iron supplements and she takes 1 a day but that caused significant nausea, vomiting and abdominal bloating. She has 4 children and was told she was anemic in the past. The patient was placed on prednisone 2 years ago for possible autoimmune disorder but that was discontinued. She complained of profound fatigue and was recently diagnosed with hypothyroidism. She denies history of menorrhagia although she have passage of clots during menstruation several months ago. She was supposed she have vitamin D deficiency in the past but has not been placed on vitamin D supplement consistently She received multiple intravenous iron since 2017 INTERVAL HISTORY: Sydney Vaughn 41 y.o. female returns for further follow-up. She complain of excessive fatigue She denies excessive menorrhagia The patient denies any recent signs or symptoms of bleeding such as spontaneous epistaxis, hematuria or  hematochezia. She denies specific food cravings She denies recent blood donation She denies chest pain, shortness of breath or syncopal episode  I have reviewed the past medical history, past surgical history, social history and family history with the patient and they are unchanged from previous note.  ALLERGIES:  has No Known Allergies.  MEDICATIONS:  Current Outpatient Prescriptions  Medication Sig Dispense Refill  . levothyroxine (SYNTHROID, LEVOTHROID) 50 MCG tablet 1 tablet daily 90 tablet 1  . phentermine 15 MG capsule Take 1 capsule (15 mg total) by mouth every morning. 30 capsule 0  . topiramate (TOPAMAX) 25 MG tablet 1 tablet with dinner for the first week and then twice a day 180 tablet 1  . venlafaxine XR (EFFEXOR XR) 75 MG 24 hr capsule Take 1 capsule (75 mg total) by mouth daily with breakfast. 90 capsule 1   No current facility-administered medications for this visit.      REVIEW OF SYSTEMS:   Constitutional: Denies fevers, chills or night sweats Eyes: Denies blurriness of vision Ears, nose, mouth, throat, and face: Denies mucositis or sore throat Respiratory: Denies cough, dyspnea or wheezes Cardiovascular: Denies palpitation, chest discomfort or lower extremity swelling Gastrointestinal:  Denies nausea, heartburn or change in bowel habits Skin: Denies abnormal skin rashes Lymphatics: Denies new lymphadenopathy or easy bruising Neurological:Denies numbness, tingling or new weaknesses Behavioral/Psych: Mood is stable, no new changes  All other systems were reviewed with the patient and are negative.  PHYSICAL EXAMINATION: ECOG PERFORMANCE STATUS: 1 - Symptomatic but completely ambulatory  Vitals:   02/14/17 1059  BP: 122/75  Pulse: 98  Resp: 20  Temp: 98.4 F (36.9 C)  SpO2: 100%   Filed Weights   02/14/17 1059  Weight: 176 lb 1.6 oz (79.9 kg)    GENERAL:alert, no distress and comfortable SKIN: skin color, texture, turgor are normal, no rashes or  significant lesions EYES: normal, Conjunctiva are pink and non-injected, sclera clear Musculoskeletal:no cyanosis of digits and no clubbing  NEURO: alert & oriented x 3 with fluent speech, no focal motor/sensory deficits  LABORATORY DATA:  I have reviewed the data as listed     Component Value Date/Time   NA 139 07/09/2016 1052   K 4.4 07/09/2016 1052   CL 103 07/09/2016 1052   CO2 22 07/09/2016 1052   GLUCOSE 88 07/09/2016 1052   GLUCOSE 95 04/20/2016 1632   BUN 7 07/09/2016 1052   CREATININE 0.71 07/09/2016 1052   CREATININE 0.95 04/20/2016 1632   CALCIUM 8.5 (L) 07/09/2016 1052   PROT 6.7 07/09/2016 1052   ALBUMIN 4.1 07/09/2016 1052   AST 18 07/09/2016 1052   ALT 14 07/09/2016 1052   ALKPHOS 81 07/09/2016 1052   BILITOT 0.4 07/09/2016 1052   GFRNONAA 105 07/09/2016 1052   GFRNONAA 74 04/20/2016 1632   GFRAA 121 07/09/2016 1052   GFRAA 85 04/20/2016 1632    No results found for: SPEP, UPEP  Lab Results  Component Value Date   WBC 5.9 02/14/2017   NEUTROABS 3.8 02/14/2017   HGB 8.3 (L) 02/14/2017   HCT 27.1 (L) 02/14/2017   MCV 68.5 (L) 02/14/2017   PLT 413 (H) 02/14/2017      Chemistry      Component Value Date/Time   NA 139 07/09/2016 1052   K 4.4 07/09/2016 1052   CL 103 07/09/2016 1052   CO2 22 07/09/2016 1052   BUN 7 07/09/2016 1052   CREATININE 0.71 07/09/2016 1052   CREATININE 0.95 04/20/2016 1632      Component Value Date/Time   CALCIUM 8.5 (L) 07/09/2016 1052   ALKPHOS 81 07/09/2016 1052   AST 18 07/09/2016 1052   ALT 14 07/09/2016 1052   BILITOT 0.4 07/09/2016 1052      ASSESSMENT & PLAN:  Anemia, iron deficiency The patient have recurrent, severe iron deficiency and missed treatments and follow-up The most likely cause of her anemia is due to chronic blood loss/malabsorption syndrome. We discussed some of the risks, benefits, and alternatives of intravenous iron infusions. The patient is symptomatic from anemia and the iron level is  critically low. She tolerated oral iron supplement poorly and desires to achieved higher levels of iron faster for adequate hematopoesis. Some of the side-effects to be expected including risks of infusion reactions, phlebitis, headaches, nausea and fatigue.  The patient is willing to proceed. Patient education material was dispensed.  Goal is to keep ferritin level greater than 50 Recommend recheck blood count in the month We discussed further workup for possible menorrhagia and management such as hormonal manipulation I also recommend consideration for GI workup The patient is fearful that being on hormonal therapy will cause her to become menopausal I told her that is not the case After extensive discussion, she wants to proceed with intravenous iron related to GI follow-up for endoscopy or consideration for treatment for possible menorrhagia   No orders of the defined types were placed in this encounter.   All questions were answered. The patient knows to call the clinic with any problems, questions or concerns. No barriers to learning was detected.  I spent 12 minutes counseling the patient face to face. The total time spent in the appointment was 15 minutes and more than 50%  was on counseling.     Heath Lark, MD 10/3/20183:01 PM

## 2017-02-16 ENCOUNTER — Other Ambulatory Visit: Payer: Self-pay | Admitting: Endocrinology

## 2017-02-16 MED ORDER — PHENTERMINE HCL 15 MG PO CAPS: 15.0000 mg | ORAL_CAPSULE | Freq: Every morning | ORAL | 3 refills | Status: DC

## 2017-02-17 ENCOUNTER — Ambulatory Visit: Payer: Federal, State, Local not specified - PPO | Admitting: Hematology and Oncology

## 2017-02-17 ENCOUNTER — Ambulatory Visit: Payer: Federal, State, Local not specified - PPO

## 2017-02-24 ENCOUNTER — Telehealth: Payer: Self-pay

## 2017-02-24 ENCOUNTER — Ambulatory Visit: Payer: Federal, State, Local not specified - PPO
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
## 2017-03-10 ENCOUNTER — Telehealth: Payer: Self-pay | Admitting: *Deleted

## 2017-03-10 ENCOUNTER — Encounter: Payer: Self-pay | Admitting: Hematology and Oncology

## 2017-03-10 NOTE — Telephone Encounter (Signed)
Message sent to scheduler to reschedule missed iron infusion

## 2017-03-15 ENCOUNTER — Telehealth: Payer: Self-pay | Admitting: Hematology and Oncology

## 2017-03-15 NOTE — Telephone Encounter (Signed)
Left message for patient regarding update to schedule. Patient scheduled per 10/26 sch message

## 2017-03-17 ENCOUNTER — Encounter (HOSPITAL_COMMUNITY): Payer: Federal, State, Local not specified - PPO

## 2017-03-20 DIAGNOSIS — J029 Acute pharyngitis, unspecified: Secondary | ICD-10-CM | POA: Diagnosis not present

## 2017-03-24 ENCOUNTER — Other Ambulatory Visit: Payer: Federal, State, Local not specified - PPO

## 2017-03-24 ENCOUNTER — Encounter (HOSPITAL_COMMUNITY): Payer: Federal, State, Local not specified - PPO

## 2017-03-24 ENCOUNTER — Other Ambulatory Visit: Payer: Self-pay | Admitting: Hematology and Oncology

## 2017-03-24 DIAGNOSIS — D508 Other iron deficiency anemias: Secondary | ICD-10-CM

## 2017-03-27 ENCOUNTER — Other Ambulatory Visit (HOSPITAL_BASED_OUTPATIENT_CLINIC_OR_DEPARTMENT_OTHER): Payer: Federal, State, Local not specified - PPO

## 2017-03-27 ENCOUNTER — Telehealth: Payer: Self-pay | Admitting: *Deleted

## 2017-03-27 ENCOUNTER — Ambulatory Visit (HOSPITAL_BASED_OUTPATIENT_CLINIC_OR_DEPARTMENT_OTHER): Payer: Federal, State, Local not specified - PPO

## 2017-03-27 VITALS — BP 115/75 | HR 87 | Temp 98.5°F | Resp 18

## 2017-03-27 DIAGNOSIS — D508 Other iron deficiency anemias: Secondary | ICD-10-CM

## 2017-03-27 LAB — CBC WITH DIFFERENTIAL/PLATELET
BASO%: 0.5 % (ref 0.0–2.0)
BASOS ABS: 0 10*3/uL (ref 0.0–0.1)
EOS%: 2.1 % (ref 0.0–7.0)
Eosinophils Absolute: 0.2 10*3/uL (ref 0.0–0.5)
HEMATOCRIT: 34 % — AB (ref 34.8–46.6)
HEMOGLOBIN: 10.5 g/dL — AB (ref 11.6–15.9)
LYMPH#: 1.7 10*3/uL (ref 0.9–3.3)
LYMPH%: 23.1 % (ref 14.0–49.7)
MCH: 24.6 pg — AB (ref 25.1–34.0)
MCHC: 30.9 g/dL — AB (ref 31.5–36.0)
MCV: 79.8 fL (ref 79.5–101.0)
MONO#: 0.7 10*3/uL (ref 0.1–0.9)
MONO%: 9.1 % (ref 0.0–14.0)
NEUT#: 4.8 10*3/uL (ref 1.5–6.5)
NEUT%: 65.2 % (ref 38.4–76.8)
PLATELETS: 303 10*3/uL (ref 145–400)
RBC: 4.26 10*6/uL (ref 3.70–5.45)
RDW: 24.5 % — AB (ref 11.2–14.5)
WBC: 7.3 10*3/uL (ref 3.9–10.3)

## 2017-03-27 LAB — FERRITIN: Ferritin: 11 ng/ml (ref 9–269)

## 2017-03-27 MED ORDER — SODIUM CHLORIDE 0.9 % IV SOLN
Freq: Once | INTRAVENOUS | Status: AC
  Administered 2017-03-27: 13:00:00 via INTRAVENOUS

## 2017-03-27 MED ORDER — SODIUM CHLORIDE 0.9 % IV SOLN
510.0000 mg | Freq: Once | INTRAVENOUS | Status: AC
  Administered 2017-03-27: 510 mg via INTRAVENOUS
  Filled 2017-03-27: qty 17

## 2017-03-27 NOTE — Patient Instructions (Signed)

## 2017-03-27 NOTE — Telephone Encounter (Signed)
-----   Message from Heath Lark, MD sent at 03/27/2017  1:53 PM EST ----- Regarding: persistent iron def Please let her know she is still anemic She will likely need more iv iron Can you ask if she is willing to come in 1 month? If so, please scheduling msg for labs and see me in 1 month ----- Message ----- From: Interface, Lab In Three Zero One Sent: 03/27/2017  12:18 PM To: Heath Lark, MD

## 2017-03-27 NOTE — Telephone Encounter (Signed)
Already discharged from [facility] infusion.  Returned patient's call, received voicemail.  Message left requesting return call in reference to scheduling repeat labs next month.  Awaiting return call from patient.   Upon completing this note, patient has returned call to collaborative.

## 2017-03-27 NOTE — Telephone Encounter (Signed)
Pt is OK to return in 1 month for lab and see Dr Alvy Bimler.  Msg to scheduler

## 2017-03-27 NOTE — Telephone Encounter (Signed)
LM to call Dr Alvy Bimler' nurse

## 2017-04-04 ENCOUNTER — Telehealth: Payer: Self-pay | Admitting: Hematology and Oncology

## 2017-04-04 NOTE — Telephone Encounter (Signed)
Gave patient AVS and calendar of upcoming appointments per 11/12 sch message

## 2017-04-11 ENCOUNTER — Other Ambulatory Visit: Payer: Self-pay | Admitting: Hematology and Oncology

## 2017-04-11 ENCOUNTER — Encounter: Payer: Self-pay | Admitting: Hematology and Oncology

## 2017-04-17 ENCOUNTER — Telehealth: Payer: Self-pay | Admitting: Hematology and Oncology

## 2017-04-17 NOTE — Telephone Encounter (Signed)
Spoke to patient regarding upcoming appointments per 11/27 sch message

## 2017-04-21 ENCOUNTER — Encounter: Payer: Self-pay | Admitting: Hematology and Oncology

## 2017-04-21 ENCOUNTER — Other Ambulatory Visit (HOSPITAL_BASED_OUTPATIENT_CLINIC_OR_DEPARTMENT_OTHER): Payer: Federal, State, Local not specified - PPO

## 2017-04-21 ENCOUNTER — Ambulatory Visit (HOSPITAL_BASED_OUTPATIENT_CLINIC_OR_DEPARTMENT_OTHER): Payer: Federal, State, Local not specified - PPO | Admitting: Hematology and Oncology

## 2017-04-21 ENCOUNTER — Ambulatory Visit (HOSPITAL_BASED_OUTPATIENT_CLINIC_OR_DEPARTMENT_OTHER): Payer: Federal, State, Local not specified - PPO

## 2017-04-21 ENCOUNTER — Telehealth: Payer: Self-pay | Admitting: Hematology and Oncology

## 2017-04-21 ENCOUNTER — Encounter: Payer: Self-pay | Admitting: Internal Medicine

## 2017-04-21 VITALS — BP 106/65 | HR 86 | Temp 98.3°F | Resp 18

## 2017-04-21 VITALS — BP 122/76 | HR 92 | Temp 98.2°F | Resp 18 | Wt 175.7 lb

## 2017-04-21 DIAGNOSIS — N920 Excessive and frequent menstruation with regular cycle: Secondary | ICD-10-CM | POA: Insufficient documentation

## 2017-04-21 DIAGNOSIS — D508 Other iron deficiency anemias: Secondary | ICD-10-CM

## 2017-04-21 DIAGNOSIS — N921 Excessive and frequent menstruation with irregular cycle: Secondary | ICD-10-CM

## 2017-04-21 LAB — CBC WITH DIFFERENTIAL/PLATELET
BASO%: 0.6 % (ref 0.0–2.0)
BASOS ABS: 0 10*3/uL (ref 0.0–0.1)
EOS%: 2.2 % (ref 0.0–7.0)
Eosinophils Absolute: 0.1 10*3/uL (ref 0.0–0.5)
HCT: 34.5 % — ABNORMAL LOW (ref 34.8–46.6)
HGB: 11.1 g/dL — ABNORMAL LOW (ref 11.6–15.9)
LYMPH%: 26.9 % (ref 14.0–49.7)
MCH: 27.7 pg (ref 25.1–34.0)
MCHC: 32.2 g/dL (ref 31.5–36.0)
MCV: 86 fL (ref 79.5–101.0)
MONO#: 0.4 10*3/uL (ref 0.1–0.9)
MONO%: 5.9 % (ref 0.0–14.0)
NEUT#: 4.2 10*3/uL (ref 1.5–6.5)
NEUT%: 64.4 % (ref 38.4–76.8)
Platelets: 294 10*3/uL (ref 145–400)
RBC: 4.01 10*6/uL (ref 3.70–5.45)
RDW: 23.2 % — ABNORMAL HIGH (ref 11.2–14.5)
WBC: 6.5 10*3/uL (ref 3.9–10.3)
lymph#: 1.7 10*3/uL (ref 0.9–3.3)

## 2017-04-21 LAB — FERRITIN: FERRITIN: 30 ng/mL (ref 9–269)

## 2017-04-21 MED ORDER — SODIUM CHLORIDE 0.9 % IV SOLN
510.0000 mg | Freq: Once | INTRAVENOUS | Status: AC
  Administered 2017-04-21: 510 mg via INTRAVENOUS
  Filled 2017-04-21: qty 17

## 2017-04-21 NOTE — Progress Notes (Signed)
[place] OFFICE PROGRESS NOTE  Sagardia, Ines Bloomer, MD SUMMARY OF HEMATOLOGIC HISTORY:  Sydney Vaughn is here because of recent diagnosis of severe iron deficiency anemia  She was found to have abnormal CBC from recent blood work. Her baseline blood work in July 2015 was within normal limits. Most recently, she complained of profound fatigue. She had recent severe microcytic anemia with confirmed iron deficiency from blood work. She  had recent chest pain on exertion, shortness of breath on minimal exertion, pre-syncopal episodes, leg cramps and palpitations. She had not noticed any recent bleeding such as epistaxis, hematuria or hematochezia The patient denies over the counter NSAID ingestion. She is not on antiplatelets agents.  She had no prior history or diagnosis of cancer. Her age appropriate screening programs are up-to-date. She denies any pica and eats a variety of diet. She donated blood many years ago but has never received blood transfusion The patient was prescribed oral iron supplements and she takes 1 a day but that caused significant nausea, vomiting and abdominal bloating. She has 4 children and was told she was anemic in the past. The patient was placed on prednisone 2 years ago for possible autoimmune disorder but that was discontinued. She complained of profound fatigue and was recently diagnosed with hypothyroidism. She denies history of menorrhagia although she have passage of clots during menstruation several months ago. She was supposed she have vitamin D deficiency in the past but has not been placed on vitamin D supplement consistently She received multiple intravenous iron since 2017 INTERVAL HISTORY: Sydney Vaughn 41 y.o. female returns for further follow-up She felt better and stronger after intravenous iron She did complain of frequent menorrhagia and passage of large clots during her menstrual cycle The patient denies any recent signs or  symptoms of bleeding such as spontaneous epistaxis, hematuria or hematochezia.   I have reviewed the past medical history, past surgical history, social history and family history with the patient and they are unchanged from previous note.  ALLERGIES:  has No Known Allergies.  MEDICATIONS:  Current Outpatient Medications  Medication Sig Dispense Refill  . levothyroxine (SYNTHROID, LEVOTHROID) 50 MCG tablet 1 tablet daily 90 tablet 1  . phentermine 15 MG capsule Take 1 capsule (15 mg total) by mouth every morning. 30 capsule 3  . topiramate (TOPAMAX) 25 MG tablet 1 tablet with dinner for the first week and then twice a day 180 tablet 1  . venlafaxine XR (EFFEXOR XR) 75 MG 24 hr capsule Take 1 capsule (75 mg total) by mouth daily with breakfast. 90 capsule 1   No current facility-administered medications for this visit.      REVIEW OF SYSTEMS:   Constitutional: Denies fevers, chills or night sweats Eyes: Denies blurriness of vision Ears, nose, mouth, throat, and face: Denies mucositis or sore throat Respiratory: Denies cough, dyspnea or wheezes Cardiovascular: Denies palpitation, chest discomfort or lower extremity swelling Gastrointestinal:  Denies nausea, heartburn or change in bowel habits Skin: Denies abnormal skin rashes Lymphatics: Denies new lymphadenopathy or easy bruising Neurological:Denies numbness, tingling or new weaknesses Behavioral/Psych: Mood is stable, no new changes  All other systems were reviewed with the patient and are negative.  PHYSICAL EXAMINATION: ECOG PERFORMANCE STATUS: 1 - Symptomatic but completely ambulatory  Vitals:   04/21/17 1305  BP: 122/76  Pulse: 92  Resp: 18  Temp: 98.2 F (36.8 C)  SpO2: 100%   Filed Weights   04/21/17 1305  Weight: 175 lb 11.2 oz (  79.7 kg)    GENERAL:alert, no distress and comfortable SKIN: skin color, texture, turgor are normal, no rashes or significant lesions EYES: normal, Conjunctiva are pink and  non-injected, sclera clear OROPHARYNX:no exudate, no erythema and lips, buccal mucosa, and tongue normal  NECK: supple, thyroid normal size, non-tender, without nodularity LYMPH:  no palpable lymphadenopathy in the cervical, axillary or inguinal LUNGS: clear to auscultation and percussion with normal breathing effort HEART: regular rate & rhythm and no murmurs and no lower extremity edema ABDOMEN:abdomen soft, non-tender and normal bowel sounds Musculoskeletal:no cyanosis of digits and no clubbing  NEURO: alert & oriented x 3 with fluent speech, no focal motor/sensory deficits  LABORATORY DATA:  I have reviewed the data as listed     Component Value Date/Time   NA 139 07/09/2016 1052   K 4.4 07/09/2016 1052   CL 103 07/09/2016 1052   CO2 22 07/09/2016 1052   GLUCOSE 88 07/09/2016 1052   GLUCOSE 95 04/20/2016 1632   BUN 7 07/09/2016 1052   CREATININE 0.71 07/09/2016 1052   CREATININE 0.95 04/20/2016 1632   CALCIUM 8.5 (L) 07/09/2016 1052   PROT 6.7 07/09/2016 1052   ALBUMIN 4.1 07/09/2016 1052   AST 18 07/09/2016 1052   ALT 14 07/09/2016 1052   ALKPHOS 81 07/09/2016 1052   BILITOT 0.4 07/09/2016 1052   GFRNONAA 105 07/09/2016 1052   GFRNONAA 74 04/20/2016 1632   GFRAA 121 07/09/2016 1052   GFRAA 85 04/20/2016 1632    No results found for: SPEP, UPEP  Lab Results  Component Value Date   WBC 6.5 04/21/2017   NEUTROABS 4.2 04/21/2017   HGB 11.1 (L) 04/21/2017   HCT 34.5 (L) 04/21/2017   MCV 86.0 04/21/2017   PLT 294 04/21/2017      Chemistry      Component Value Date/Time   NA 139 07/09/2016 1052   K 4.4 07/09/2016 1052   CL 103 07/09/2016 1052   CO2 22 07/09/2016 1052   BUN 7 07/09/2016 1052   CREATININE 0.71 07/09/2016 1052   CREATININE 0.95 04/20/2016 1632      Component Value Date/Time   CALCIUM 8.5 (L) 07/09/2016 1052   ALKPHOS 81 07/09/2016 1052   AST 18 07/09/2016 1052   ALT 14 07/09/2016 1052   BILITOT 0.4 07/09/2016 1052      ASSESSMENT &  PLAN:  Anemia, iron deficiency The most likely cause of her anemia is due to chronic blood loss/malabsorption syndrome. We discussed some of the risks, benefits, and alternatives of intravenous iron infusions. The patient is symptomatic from anemia and the iron level is critically low. She tolerated oral iron supplement poorly and desires to achieved higher levels of iron faster for adequate hematopoesis. Some of the side-effects to be expected including risks of infusion reactions, phlebitis, headaches, nausea and fatigue.  The patient is willing to proceed. Patient education material was dispensed.  Goal is to keep ferritin level greater than 50 We discussed further evaluation in including referral to GI specialist and GYN for management of menorrhagia and she agreed to proceed  Menorrhagia She denies prior history of abnormal Pap smear She have irregular menorrhagia Recommend GYN referral and she agreed   Orders Placed This Encounter  Procedures  . Ambulatory referral to Gynecology    Referral Priority:   Routine    Referral Type:   Consultation    Referral Reason:   Specialty Services Required    Requested Specialty:   Gynecology    Number of  Visits Requested:   1  . Ambulatory referral to Gastroenterology    Referral Priority:   Routine    Referral Type:   Consultation    Referral Reason:   Specialty Services Required    Number of Visits Requested:   1    All questions were answered. The patient knows to call the clinic with any problems, questions or concerns. No barriers to learning was detected.  I spent 15 minutes counseling the patient face to face. The total time spent in the appointment was 20 minutes and more than 50% was on counseling.     Heath Lark, MD 12/7/20181:33 PM

## 2017-04-21 NOTE — Patient Instructions (Signed)

## 2017-04-21 NOTE — Assessment & Plan Note (Signed)
She denies prior history of abnormal Pap smear She have irregular menorrhagia Recommend GYN referral and she agreed

## 2017-04-21 NOTE — Progress Notes (Signed)
Pt. Declined waiting for full 30 minutes of post infusion observation. D/C with VSS. Pt. Verbalizes to call us with any problems that may arise.

## 2017-04-21 NOTE — Telephone Encounter (Signed)
Gave avs and calendar for February. Ask Dr

## 2017-04-21 NOTE — Assessment & Plan Note (Signed)
The most likely cause of her anemia is due to chronic blood loss/malabsorption syndrome. We discussed some of the risks, benefits, and alternatives of intravenous iron infusions. The patient is symptomatic from anemia and the iron level is critically low. She tolerated oral iron supplement poorly and desires to achieved higher levels of iron faster for adequate hematopoesis. Some of the side-effects to be expected including risks of infusion reactions, phlebitis, headaches, nausea and fatigue.  The patient is willing to proceed. Patient education material was dispensed.  Goal is to keep ferritin level greater than 50 We discussed further evaluation in including referral to GI specialist and GYN for management of menorrhagia and she agreed to proceed

## 2017-05-01 ENCOUNTER — Encounter: Payer: Self-pay | Admitting: Emergency Medicine

## 2017-05-01 DIAGNOSIS — R928 Other abnormal and inconclusive findings on diagnostic imaging of breast: Secondary | ICD-10-CM | POA: Diagnosis not present

## 2017-05-05 ENCOUNTER — Ambulatory Visit: Payer: Federal, State, Local not specified - PPO | Admitting: Obstetrics & Gynecology

## 2017-05-16 DIAGNOSIS — D251 Intramural leiomyoma of uterus: Secondary | ICD-10-CM

## 2017-05-16 HISTORY — DX: Intramural leiomyoma of uterus: D25.1

## 2017-05-24 ENCOUNTER — Other Ambulatory Visit: Payer: Self-pay | Admitting: Emergency Medicine

## 2017-05-24 ENCOUNTER — Other Ambulatory Visit: Payer: Self-pay

## 2017-05-24 ENCOUNTER — Ambulatory Visit: Payer: Federal, State, Local not specified - PPO | Admitting: Family Medicine

## 2017-05-24 ENCOUNTER — Encounter: Payer: Self-pay | Admitting: Family Medicine

## 2017-05-24 VITALS — BP 120/70 | HR 104 | Temp 98.8°F | Resp 16 | Ht 63.0 in | Wt 176.0 lb

## 2017-05-24 DIAGNOSIS — J029 Acute pharyngitis, unspecified: Secondary | ICD-10-CM

## 2017-05-24 DIAGNOSIS — J069 Acute upper respiratory infection, unspecified: Secondary | ICD-10-CM

## 2017-05-24 LAB — POCT RAPID STREP A (OFFICE): Rapid Strep A Screen: NEGATIVE

## 2017-05-24 NOTE — Patient Instructions (Signed)
     IF you received an x-ray today, you will receive an invoice from [facility]. Please contact [facility] at [phone] with questions or concerns regarding your invoice.   IF you received labwork today, you will receive an invoice from [facility]. Please contact [facility] at [phone] with questions or concerns regarding your invoice.   Our billing staff will not be able to assist you with questions regarding bills from these companies.  You will be contacted with the lab results as soon as they are available. The fastest way to get your results is to activate your My Chart account. Instructions are located on the last page of this paperwork. If you have not heard from us regarding the results in 2 weeks, please contact this office.     

## 2017-05-24 NOTE — Progress Notes (Signed)
Chief Complaint  Patient presents with  . Sinusitis    possible x 2 weeks, right side lymph nodes swollen (knot) some soreness but no pain, throat is sore, ha    HPI  Pt reports that she has uri symptoms that started 2 weeks ago She used mucinex which helped with her pain She states that the mucinex kept her up and she stopped the medication Now she has fatigue, right side lymph node swollen, muscle aches, sore throat but no real cough She states that she also has some headaches that are frontal and occipital She states that her asthma has not been an issue since childhood She is Sydney nonsmoker She is drinking fluids   Past Medical History:  Diagnosis Date  . Adult acne   . ANA positive   . Anemia   . Asthma   . Chronic sinusitis    CT and MRI.   Marland Kitchen Iron deficiency   . Polyarthralgia    was seeing rheumatology    Current Outpatient Medications  Medication Sig Dispense Refill  . levothyroxine (SYNTHROID, LEVOTHROID) 50 MCG tablet 1 tablet daily 90 tablet 1  . phentermine 15 MG capsule Take 1 capsule (15 mg total) by mouth every morning. 30 capsule 3  . topiramate (TOPAMAX) 25 MG tablet 1 tablet with dinner for the first week and then twice Sydney day 180 tablet 1  . venlafaxine XR (EFFEXOR-XR) 75 MG 24 hr capsule TAKE 1 CAPSULE (75 MG TOTAL) BY MOUTH DAILY WITH BREAKFAST. 90 capsule 1   No current facility-administered medications for this visit.     Allergies: No Known Allergies  Past Surgical History:  Procedure Laterality Date  . APPENDECTOMY    . TUBAL LIGATION      Social History   Socioeconomic History  . Marital status: Married    Spouse name: None  . Number of children: 4  . Years of education: None  . Highest education level: None  Social Needs  . Financial resource strain: None  . Food insecurity - worry: None  . Food insecurity - inability: None  . Transportation needs - medical: None  . Transportation needs - non-medical: None  Occupational History    Employer: [employer]    Comment: administration  Tobacco Use  . Smoking status: Never Smoker  . Smokeless tobacco: Never Used  Substance and Sexual Activity  . Alcohol use: No  . Drug use: No  . Sexual activity: Yes    Birth control/protection: Surgical  Other Topics Concern  . None  Social History Narrative   Ms. Sydney Vaughn lives in [place] with her husband and children. She has 4 daughters: ages ranging from 94 yo- 15 yo. 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      Family History  Problem Relation Age of Onset  . Thyroid disease Mother   . Sinusitis Sister   . Sinusitis Brother   . Lupus Daughter        treated at [facility]  . Allergies Daughter      ROS Review of Systems See HPI Constitution: No fevers or chills No malaise No diaphoresis Skin: No rash or itching Eyes: no blurry vision, no double vision GU: no dysuria or hematuria Neuro: no dizziness or headaches all others reviewed and negative   Objective: Vitals:   05/24/17 1003  BP: 120/70  Pulse: (!) 104  Resp: 16  Temp: 98.8 F (37.1 C)  TempSrc: Oral  SpO2:  100%  Weight: 176 lb (79.8 kg)  Height: 5' 3"  (1.6 m)    Physical Exam General: alert, oriented, in NAD Head: normocephalic, atraumatic, no sinus tenderness Eyes: EOM intact, no scleral icterus or conjunctival injection Ears: TM clear bilaterally Nose: mucosa +erythematous, +edematous Throat: no pharyngeal exudate or erythema Lymph: no posterior auricular, submental or cervical lymph adenopathy Heart: normal rate, normal sinus rhythm, no murmurs Lungs: clear to auscultation bilaterally, no wheezing  Assessment and Plan Sydney Vaughn was seen today for sinusitis.  Diagnoses and all orders for this visit:  Sore throat -     POCT rapid strep Sydney  Acute URI  Advised nasal spray Discussed melatonin Sleepy time tea Increasing hydration   Sydney Vaughn Sydney Vaughn

## 2017-05-24 NOTE — Telephone Encounter (Signed)
Refill req for Effexor XR sent to Dr. Mitchel Honour

## 2017-06-01 ENCOUNTER — Other Ambulatory Visit: Payer: Federal, State, Local not specified - PPO

## 2017-06-02 ENCOUNTER — Telehealth: Payer: Self-pay | Admitting: *Deleted

## 2017-06-02 NOTE — Telephone Encounter (Signed)
Faxed signed Rx for venlafaxine to pharmacy, per Dr Mitchel Honour. Confirmation page received 1:34 pm.

## 2017-06-05 ENCOUNTER — Encounter: Payer: Self-pay | Admitting: Endocrinology

## 2017-06-05 ENCOUNTER — Other Ambulatory Visit: Payer: Federal, State, Local not specified - PPO

## 2017-06-05 ENCOUNTER — Ambulatory Visit: Payer: Federal, State, Local not specified - PPO | Admitting: Endocrinology

## 2017-06-05 VITALS — BP 124/82 | HR 95 | Ht 63.0 in | Wt 180.0 lb

## 2017-06-05 DIAGNOSIS — E063 Autoimmune thyroiditis: Secondary | ICD-10-CM

## 2017-06-05 DIAGNOSIS — R7989 Other specified abnormal findings of blood chemistry: Secondary | ICD-10-CM | POA: Diagnosis not present

## 2017-06-05 LAB — T4, FREE: FREE T4: 0.75 ng/dL (ref 0.60–1.60)

## 2017-06-05 LAB — TSH: TSH: 4.09 u[IU]/mL (ref 0.35–4.50)

## 2017-06-05 NOTE — Progress Notes (Signed)
Patient ID: Sydney Vaughn, female   DOB: 1976/01/19, 42 y.o.   MRN: [id number]             Reason for Appointment:  Follow-up    History of Present Illness:   WEIGHT management:  Previous history: She thinks her weight had been in the 170s a couple of years ago and has gained weight which she cannot shed Patient said that she has seen a dietitian in 2017 and is trying to modify her diet with taking salads for lunch, avoiding fried food usually She is trying to avoid snacking, trying to bake  foods as well as usually trying to avoid high-fat meats and avoiding sweets as well.  However she is usually trying to prepare food for her family with 4 children.   Her weight was about the same in July 2017  Since she had expressed difficulty with weight loss on her own with diet and exercise she was started on phentermine and Topamax on her initial visit in 07/2016   On her initial follow-up she had lost 18 pounds and had no side effects with the 2 drugs She was able to cut back on portions  RECENT history:  More recently she has not lost any significant amount of weight Has gained back about 5 pounds since her last visit Again she has not been able to exercise for various reasons  As before she is needing to cook for her family and may not always have the best diet including over the holidays  For the last few days because of her intercurrent illness with sinusitis she has not taken her Topamax or phentermine and she thinks her potion control is more difficult now and wants to go back on these However her insurance does not cover any brand-name weight loss medications   Wt Readings from Last 3 Encounters:  06/05/17 180 lb (81.6 kg)  05/24/17 176 lb (79.8 kg)  04/21/17 175 lb 11.2 oz (79.7 kg)    Hypothyroidism was first diagnosed in 11/2015  At the time of diagnosis patient was having symptoms of fatigue, weight gain and hair loss. She apparently has been having symptoms for several  years the same way She has not had problems with cold intolerance except sometimes her feet get cold Baseline TSH was 7.7 and she was started on levothyroxine 25 g daily  On her initial consultation she was having symptoms of fatigue, having difficulty losing weight, fatigue on exercising and some hair loss although not as much, this is despite her taking the levothyroxine Also her thyroid functions including free T3 were normal  Recent history: On her last visit she was advised to follow-up with her rheumatologist for her severe anemia and iron deficiency since she had more fatigue She does still feel significantly fatigued She generally has some cold intolerance but sometimes will feel warm No hair loss  In 01/2017 her levothyroxine was increased up to 50 g from her previous dose of 37.5 However she did not feel any different with the dosage change  Thyroid function results have been as follows:  Lab Results  Component Value Date   TSH 4.19 02/02/2017   TSH 3.87 10/27/2016   TSH 3.230 07/09/2016   FREET4 0.66 02/02/2017   FREET4 0.67 10/27/2016   FREET4 0.68 08/04/2016      Past Medical History:  Diagnosis Date  . Adult acne   . ANA positive   . Anemia   . Asthma   . Chronic sinusitis  CT and MRI.   Marland Kitchen Iron deficiency   . Polyarthralgia    was seeing rheumatology    Past Surgical History:  Procedure Laterality Date  . APPENDECTOMY    . TUBAL LIGATION      Family History  Problem Relation Age of Onset  . Thyroid disease Mother   . Sinusitis Sister   . Sinusitis Brother   . Lupus Daughter        treated at [facility]  . Allergies Daughter     Social History:  reports that  has never smoked. she has never used smokeless tobacco. She reports that she does not drink alcohol or use drugs.  Allergies: No Known Allergies  Allergies as of 06/05/2017   No Known Allergies     Medication List        Accurate as of 06/05/17  3:15 PM. Always use your most recent  med list.          levothyroxine 50 MCG tablet Commonly known as:  LEVOTHROID 1 tablet daily   phentermine 15 MG capsule Take 1 capsule (15 mg total) by mouth every morning.   topiramate 25 MG tablet Commonly known as:  TOPAMAX 1 tablet with dinner for the first week and then twice a day   venlafaxine XR 75 MG 24 hr capsule Commonly known as:  EFFEXOR-XR TAKE 1 CAPSULE (75 MG TOTAL) BY MOUTH DAILY WITH BREAKFAST.         Review of Systems             Examination:    BP 124/82   Pulse 95   Ht 5' 3"  (1.6 m)   Wt 180 lb (81.6 kg)   LMP 05/16/2017   SpO2 99%   BMI 31.89 kg/m    Thyroid not palpable   Assessment: OBESITY:  With using phentermine and Topamax she has kept her weight down although has gained back some recently This is from inconsistent diet and exercise which she needs to get back into   HYPOTHYROIDISM, mild with highest TSH 7.7 Unlikely her hypothyroidism is causing her fatigue which has been persistent despite various other interventions and increase of her doses on the last visit Explained to her the basis for using levothyroxine as a natural replacement medication and she does not need to change this especially since her T3 levels have been normal previously   PLAN:   Check free T4 and TSH again today  She will continue Topamax and phentermine and take them together at lunchtime  She will consider consultation with dietitian Resume exercise when able to  Elayne Snare 06/05/2017, 3:15 PM     Note: This office note was prepared with Dragon voice recognition system technology. Any transcriptional errors that result from this process are unintentional.  ADDENDUM: TSH is still about the same at 4.1, she can try taking one tablet alternating with 1-1/2 until  next visit  Lab Results  Component Value Date   TSH 4.09 06/05/2017

## 2017-06-06 MED ORDER — TOPIRAMATE 25 MG PO TABS: ORAL_TABLET | ORAL | 1 refills | Status: DC

## 2017-06-06 MED ORDER — PHENTERMINE HCL 15 MG PO CAPS: 15.0000 mg | ORAL_CAPSULE | Freq: Every morning | ORAL | 3 refills | Status: DC

## 2017-06-06 MED ORDER — LEVOTHYROXINE SODIUM 50 MCG PO TABS: ORAL_TABLET | ORAL | 1 refills | Status: DC

## 2017-06-06 NOTE — Addendum Note (Signed)
Addended by: Elayne Snare on: 06/06/2017 12:19 PM   Modules accepted: Orders

## 2017-06-09 ENCOUNTER — Ambulatory Visit: Payer: Federal, State, Local not specified - PPO | Admitting: Internal Medicine

## 2017-06-13 ENCOUNTER — Encounter: Payer: Self-pay | Admitting: Hematology and Oncology

## 2017-06-14 ENCOUNTER — Ambulatory Visit: Payer: Federal, State, Local not specified - PPO | Admitting: Obstetrics & Gynecology

## 2017-06-14 ENCOUNTER — Encounter: Payer: Self-pay | Admitting: Obstetrics & Gynecology

## 2017-06-14 VITALS — BP 130/80 | Ht 63.0 in | Wt 173.0 lb

## 2017-06-14 DIAGNOSIS — D649 Anemia, unspecified: Secondary | ICD-10-CM | POA: Diagnosis not present

## 2017-06-14 DIAGNOSIS — N920 Excessive and frequent menstruation with regular cycle: Secondary | ICD-10-CM | POA: Diagnosis not present

## 2017-06-14 DIAGNOSIS — D219 Benign neoplasm of connective and other soft tissue, unspecified: Secondary | ICD-10-CM

## 2017-06-14 NOTE — Progress Notes (Signed)
    Sydney Vaughn Jan 26, 1976 [id number]        42 y.o.  [id number] Married.  S/P Bilateral Tubal Ligation.  4 children, doing well.  RP:  Heavy menses worsening x 1 year   HPI:  Menses heavy, usually every month, but lately occasionally every 3 weeks. Heavy vaginal bleeding first 5 days, lasting 7-8 days.  Severe cramps with menses.  Sensation of pressure in lower abdomen.  Minimal help with Ibuprofen.  Hb 11.1 in December 2018.  TSH/FT4 normal 06/05/2017.  BMI 31.89.  Attempting weight loss with Phentermine and Topamax x 07/2016, low carb diet/physical activity.  Last Pap negative 01/2016.  Mammo negative 04/2017.   OB History  Gravida Para Term Preterm AB Living  5 4     1 4   SAB TAB Ectopic Multiple Live Births  1            # Outcome Date GA Lbr Len/2nd Weight Sex Delivery Anes PTL Lv  5 SAB           4 Para           3 Para           2 Para           1 Para               Past medical history,surgical history, problem list, medications, allergies, family history and social history were all reviewed and documented in the EPIC chart.   Directed ROS with pertinent positives and negatives documented in the history of present illness/assessment and plan.  Exam:  Vitals:   06/14/17 1200  BP: 130/80  Weight: 173 lb (78.5 kg)  Height: 5' 3"  (1.6 m)   General appearance:  Normal  Abdomen: Soft, NT.  Uterus felt about the pubis.  Gynecologic exam: Vulva normal.  Bimanual exam:  Large nodular uterus about 14 cm, mobile, NT.  Fibroids extending to adnexae, difficult to evaluate the ovaries.     Assessment/Plan:  42 y.o. [id number]   1. Menorrhagia with regular cycle Gynecologic exam showing a large nodular uterus at least 14 cm in diameter, compatible with multiple uterine fibroids.  Probably responsible for menorrhagia with secondary anemia.  Also explaining the sensation of pressure and dysmenorrhea.  Patient will follow up for pelvic ultrasound to document size and location of the  fibroids, assess the endometrial lining, as well as to evaluate her ovaries which were difficult to assess by gynecologic exam.  Normal recent TSH and free T4 on Synthroid for hypothyroidism.  Medical/hormonal approaches versus surgical approaches to control menorrhagia in the presence of uterine fibroids reviewed.  Patient is requesting to proceed with hysterectomy.  We will decide on surgical treatment and approach at follow-up visit with the pelvic ultrasound information. - US Transvaginal Non-OB; Future  2. Secondary anemia Last hemoglobin in December 2018 was at 11.1.  Iron sulfate 325 mg twice a day recommended.  Will probably proceed with a surgical treatment of her menorrhagia.  3. Fibroids Uterine fibroids with large nodular uterus about 14 cm on gynecologic exam.  Patient will follow up with a pelvic ultrasound to document more precisely. - US Transvaginal Non-OB; Future  Counseling on above issues more than 50% for 45 minutes.  Princess Bruins MD, 12:21 PM 06/14/2017

## 2017-06-16 ENCOUNTER — Encounter: Payer: Self-pay | Admitting: Endocrinology

## 2017-06-17 ENCOUNTER — Encounter: Payer: Self-pay | Admitting: Obstetrics & Gynecology

## 2017-06-17 NOTE — Patient Instructions (Signed)
1. Menorrhagia with regular cycle Gynecologic exam showing a large nodular uterus at least 14 cm in diameter, compatible with multiple uterine fibroids.  Probably responsible for menorrhagia with secondary anemia.  Also explaining the sensation of pressure and dysmenorrhea.  Patient will follow up for pelvic ultrasound to document size and location of the fibroids, assess the endometrial lining, as well as to evaluate her ovaries which were difficult to assess by gynecologic exam.  Normal recent TSH and free T4 on Synthroid for hypothyroidism.  Medical/hormonal approaches versus surgical approaches to control menorrhagia in the presence of uterine fibroids reviewed.  Patient is requesting to proceed with hysterectomy.  We will decide on surgical treatment and approach at follow-up visit with the pelvic ultrasound information. - US Transvaginal Non-OB; Future  2. Secondary anemia Last hemoglobin in December 2018 was at 11.1.  Iron sulfate 325 mg twice a day recommended.  Will probably proceed with a surgical treatment of her menorrhagia.  3. Fibroids Uterine fibroids with large nodular uterus about 14 cm on gynecologic exam.  Patient will follow up with a pelvic ultrasound to document more precisely. - US Transvaginal Non-OB; Future  Sydney Vaughn, it was a pleasure meeting you today!  I will see you again soon for your pelvic ultrasound.  Total Laparoscopic Hysterectomy A total laparoscopic hysterectomy is a minimally invasive surgery to remove your uterus and cervix. This surgery is performed by making several small cuts (incisions) in your abdomen. It can also be done with a thin, lighted tube (laparoscope) inserted into two small incisions in your lower abdomen. Your fallopian tubes and ovaries can be removed (bilateral salpingo-oophorectomy) during this surgery as well.Benefits of minimally invasive surgery include:  Less pain.  Less risk of blood loss.  Less risk of infection.  Quicker return to  normal activities.  Tell a health care provider about:  Any allergies you have.  All medicines you are taking, including vitamins, herbs, eye drops, creams, and over-the-counter medicines.  Any problems you or family members have had with anesthetic medicines.  Any blood disorders you have.  Any surgeries you have had.  Any medical conditions you have. What are the risks? Generally, this is a safe procedure. However, as with any procedure, complications can occur. Possible complications include:  Bleeding.  Blood clots in the legs or lung.  Infection.  Injury to surrounding organs.  Problems with anesthesia.  Early menopause symptoms (hot flashes, night sweats, insomnia).  Risk of conversion to an open abdominal incision.  What happens before the procedure?  Ask your health care provider about changing or stopping your regular medicines.  Do not take aspirin or blood thinners (anticoagulants) for 1 week before the surgery or as told by your health care provider.  Do not eat or drink anything for 8 hours before the surgery or as told by your health care provider.  Quit smoking if you smoke.  Arrange for a ride home after surgery and for someone to help you at home during recovery. What happens during the procedure?  You will be given antibiotic medicine.  An IV tube will be placed in your arm. You will be given medicine to make you sleep (general anesthetic).  A gas (carbon dioxide) will be used to inflate your abdomen. This will allow your surgeon to look inside your abdomen, perform your surgery, and treat any other problems found if necessary.  Three or four small incisions (often less than 1/2 inch) will be made in your abdomen. One of these  incisions will be made in the area of your belly button (navel). The laparoscope will be inserted into the incision. Your surgeon will look through the laparoscope while doing your procedure.  Other surgical instruments  will be inserted through the other incisions.  Your uterus may be removed through your vagina or cut into small pieces and removed through the small incisions.  Your incisions will be closed. What happens after the procedure?  The gas will be released from inside your abdomen.  You will be taken to the recovery area where a nurse will watch and check your progress. Once you are awake, stable, and taking fluids well, without other problems, you will return to your room or be allowed to go home.  There is usually minimal discomfort following the surgery because the incisions are so small.  You will be given pain medicine while you are in the hospital and for when you go home. This information is not intended to replace advice given to you by your health care provider. Make sure you discuss any questions you have with your health care provider. Document Released: 02/27/2007 Document Revised: 10/08/2015 Document Reviewed: 11/20/2012 Elsevier Interactive Patient Education  2017 [employer].

## 2017-06-21 ENCOUNTER — Encounter: Payer: Self-pay | Admitting: Obstetrics & Gynecology

## 2017-06-23 ENCOUNTER — Encounter: Payer: Self-pay | Admitting: Hematology and Oncology

## 2017-06-23 ENCOUNTER — Inpatient Hospital Stay
Payer: Federal, State, Local not specified - PPO | Attending: Hematology and Oncology | Admitting: Hematology and Oncology

## 2017-06-23 ENCOUNTER — Inpatient Hospital Stay: Payer: Federal, State, Local not specified - PPO

## 2017-06-23 ENCOUNTER — Telehealth: Payer: Self-pay | Admitting: *Deleted

## 2017-06-23 DIAGNOSIS — D508 Other iron deficiency anemias: Secondary | ICD-10-CM

## 2017-06-23 DIAGNOSIS — D259 Leiomyoma of uterus, unspecified: Secondary | ICD-10-CM | POA: Diagnosis not present

## 2017-06-23 DIAGNOSIS — D509 Iron deficiency anemia, unspecified: Secondary | ICD-10-CM | POA: Insufficient documentation

## 2017-06-23 DIAGNOSIS — N920 Excessive and frequent menstruation with regular cycle: Secondary | ICD-10-CM | POA: Diagnosis not present

## 2017-06-23 LAB — CBC WITH DIFFERENTIAL/PLATELET
BASOS PCT: 1 %
Basophils Absolute: 0 10*3/uL (ref 0.0–0.1)
EOS ABS: 0.1 10*3/uL (ref 0.0–0.5)
Eosinophils Relative: 2 %
HCT: 35.1 % (ref 34.8–46.6)
Hemoglobin: 11.6 g/dL (ref 11.6–15.9)
LYMPHS ABS: 1.4 10*3/uL (ref 0.9–3.3)
Lymphocytes Relative: 25 %
MCH: 29.4 pg (ref 25.1–34.0)
MCHC: 33 g/dL (ref 31.5–36.0)
MCV: 88.9 fL (ref 79.5–101.0)
MONO ABS: 0.5 10*3/uL (ref 0.1–0.9)
MONOS PCT: 10 %
Neutro Abs: 3.4 10*3/uL (ref 1.5–6.5)
Neutrophils Relative %: 62 %
Platelets: 331 10*3/uL (ref 145–400)
RBC: 3.95 MIL/uL (ref 3.70–5.45)
RDW: 14.9 % — AB (ref 11.2–14.5)
WBC: 5.5 10*3/uL (ref 3.9–10.3)

## 2017-06-23 LAB — FERRITIN: Ferritin: 8 ng/mL — ABNORMAL LOW (ref 9–269)

## 2017-06-23 NOTE — Assessment & Plan Note (Signed)
She was evaluated by her gynecologist and was found to have a uterine fibroid She plan for partial hysterectomy which I think is reasonable option Hopefully, after surgery, she would not have menorrhagia leading to iron deficiency anemia again I would defer to a gynecologist for further management

## 2017-06-23 NOTE — Telephone Encounter (Signed)
Notified of message below. Verbalized understanding

## 2017-06-23 NOTE — Progress Notes (Signed)
[place] OFFICE PROGRESS NOTE  Sagardia, Ines Bloomer, MD SUMMARY OF HEMATOLOGIC HISTORY:  Sydney Vaughn is here because of recent diagnosis of severe iron deficiency anemia  She was found to have abnormal CBC from recent blood work. Her baseline blood work in July 2015 was within normal limits. Most recently, she complained of profound fatigue. She had recent severe microcytic anemia with confirmed iron deficiency from blood work. She  had recent chest pain on exertion, shortness of breath on minimal exertion, pre-syncopal episodes, leg cramps and palpitations. She had not noticed any recent bleeding such as epistaxis, hematuria or hematochezia The patient denies over the counter NSAID ingestion. She is not on antiplatelets agents.  She had no prior history or diagnosis of cancer. Her age appropriate screening programs are up-to-date. She denies any pica and eats a variety of diet. She donated blood many years ago but has never received blood transfusion The patient was prescribed oral iron supplements and she takes 1 a day but that caused significant nausea, vomiting and abdominal bloating. She has 4 children and was told she was anemic in the past. The patient was placed on prednisone 2 years ago for possible autoimmune disorder but that was discontinued. She complained of profound fatigue and was recently diagnosed with hypothyroidism. She denies history of menorrhagia although she have passage of clots during menstruation several months ago. She was supposed she have vitamin D deficiency in the past but has not been placed on vitamin D supplement consistently She received multiple intravenous iron since 2017 INTERVAL HISTORY: Sydney Vaughn 42 y.o. female returns for further follow-up She felt better and stronger after intravenous iron She was referred to see a gynecologist and was found to have uterine fibroid.  She is planning to have a partial hysterectomy for  this The patient denies any recent signs or symptoms of bleeding such as spontaneous epistaxis, hematuria or hematochezia. She denies recent infusion reaction  I have reviewed the past medical history, past surgical history, social history and family history with the patient and they are unchanged from previous note.  ALLERGIES:  has No Known Allergies.  MEDICATIONS:  Current Outpatient Medications  Medication Sig Dispense Refill  . levothyroxine (SYNTHROID, LEVOTHROID) 50 MCG tablet 1 tablet alternating with 1-1/2 daily 100 tablet 1  . phentermine 15 MG capsule Take 1 capsule (15 mg total) by mouth every morning. 30 capsule 3  . topiramate (TOPAMAX) 25 MG tablet 1 tablet a day 90 tablet 1  . venlafaxine XR (EFFEXOR-XR) 75 MG 24 hr capsule TAKE 1 CAPSULE (75 MG TOTAL) BY MOUTH DAILY WITH BREAKFAST. 90 capsule 1   No current facility-administered medications for this visit.      REVIEW OF SYSTEMS:   Constitutional: Denies fevers, chills or night sweats Eyes: Denies blurriness of vision Ears, nose, mouth, throat, and face: Denies mucositis or sore throat Respiratory: Denies cough, dyspnea or wheezes Cardiovascular: Denies palpitation, chest discomfort or lower extremity swelling Gastrointestinal:  Denies nausea, heartburn or change in bowel habits Skin: Denies abnormal skin rashes Lymphatics: Denies new lymphadenopathy or easy bruising Neurological:Denies numbness, tingling or new weaknesses Behavioral/Psych: Mood is stable, no new changes  All other systems were reviewed with the patient and are negative.  PHYSICAL EXAMINATION: ECOG PERFORMANCE STATUS: 0 - Asymptomatic  Vitals:   06/23/17 1248  BP: 140/90  Pulse: 97  Resp: 18  Temp: 98.7 F (37.1 C)  SpO2: 100%   Filed Weights   06/23/17 1248  Weight:  173 lb 9.6 oz (78.7 kg)    GENERAL:alert, no distress and comfortable SKIN: skin color, texture, turgor are normal, no rashes or significant lesions Musculoskeletal:no  cyanosis of digits and no clubbing  NEURO: alert & oriented x 3 with fluent speech, no focal motor/sensory deficits  LABORATORY DATA:  I have reviewed the data as listed     Component Value Date/Time   NA 139 07/09/2016 1052   K 4.4 07/09/2016 1052   CL 103 07/09/2016 1052   CO2 22 07/09/2016 1052   GLUCOSE 88 07/09/2016 1052   GLUCOSE 95 04/20/2016 1632   BUN 7 07/09/2016 1052   CREATININE 0.71 07/09/2016 1052   CREATININE 0.95 04/20/2016 1632   CALCIUM 8.5 (L) 07/09/2016 1052   PROT 6.7 07/09/2016 1052   ALBUMIN 4.1 07/09/2016 1052   AST 18 07/09/2016 1052   ALT 14 07/09/2016 1052   ALKPHOS 81 07/09/2016 1052   BILITOT 0.4 07/09/2016 1052   GFRNONAA 105 07/09/2016 1052   GFRNONAA 74 04/20/2016 1632   GFRAA 121 07/09/2016 1052   GFRAA 85 04/20/2016 1632    No results found for: SPEP, UPEP  Lab Results  Component Value Date   WBC 5.5 06/23/2017   NEUTROABS 3.4 06/23/2017   HGB 11.6 06/23/2017   HCT 35.1 06/23/2017   MCV 88.9 06/23/2017   PLT 331 06/23/2017      Chemistry      Component Value Date/Time   NA 139 07/09/2016 1052   K 4.4 07/09/2016 1052   CL 103 07/09/2016 1052   CO2 22 07/09/2016 1052   BUN 7 07/09/2016 1052   CREATININE 0.71 07/09/2016 1052   CREATININE 0.95 04/20/2016 1632      Component Value Date/Time   CALCIUM 8.5 (L) 07/09/2016 1052   ALKPHOS 81 07/09/2016 1052   AST 18 07/09/2016 1052   ALT 14 07/09/2016 1052   BILITOT 0.4 07/09/2016 1052      ASSESSMENT & PLAN:  Anemia, iron deficiency Anemia has resolved Iron study is pending She is not symptomatic Recommend close follow-up with primary care doctor If she becomes anemic again, I will see her back in give her iron infusion  Menorrhagia She was evaluated by her gynecologist and was found to have a uterine fibroid She plan for partial hysterectomy which I think is reasonable option Hopefully, after surgery, she would not have menorrhagia leading to iron deficiency anemia  again I would defer to a gynecologist for further management   No orders of the defined types were placed in this encounter.   All questions were answered. The patient knows to call the clinic with any problems, questions or concerns. No barriers to learning was detected.  I spent 10 minutes counseling the patient face to face. The total time spent in the appointment was 15 minutes and more than 50% was on counseling.     Heath Lark, MD 2/8/20191:14 PM

## 2017-06-23 NOTE — Telephone Encounter (Signed)
-----   Message from Heath Lark, MD sent at 06/23/2017  1:45 PM EST ----- Regarding: ferritin level Ferritin level a bit low but no need for IV iron due to normal hemoglobin OK to take OTC iron daily at bedtime ----- Message ----- From: Interface, [facility] [place] Sent: 06/23/2017  12:37 PM To: Heath Lark, MD

## 2017-06-23 NOTE — Assessment & Plan Note (Signed)
Anemia has resolved Iron study is pending She is not symptomatic Recommend close follow-up with primary care doctor If she becomes anemic again, I will see her back in give her iron infusion

## 2017-06-26 ENCOUNTER — Ambulatory Visit: Payer: Federal, State, Local not specified - PPO | Admitting: Obstetrics & Gynecology

## 2017-06-26 ENCOUNTER — Encounter: Payer: Self-pay | Admitting: Obstetrics & Gynecology

## 2017-06-26 ENCOUNTER — Other Ambulatory Visit: Payer: Self-pay | Admitting: Obstetrics & Gynecology

## 2017-06-26 ENCOUNTER — Ambulatory Visit (INDEPENDENT_AMBULATORY_CARE_PROVIDER_SITE_OTHER): Payer: Federal, State, Local not specified - PPO

## 2017-06-26 VITALS — BP 128/84

## 2017-06-26 DIAGNOSIS — D251 Intramural leiomyoma of uterus: Secondary | ICD-10-CM

## 2017-06-26 DIAGNOSIS — N920 Excessive and frequent menstruation with regular cycle: Secondary | ICD-10-CM

## 2017-06-26 DIAGNOSIS — D649 Anemia, unspecified: Secondary | ICD-10-CM

## 2017-06-26 DIAGNOSIS — D219 Benign neoplasm of connective and other soft tissue, unspecified: Secondary | ICD-10-CM | POA: Diagnosis not present

## 2017-06-26 DIAGNOSIS — N939 Abnormal uterine and vaginal bleeding, unspecified: Secondary | ICD-10-CM

## 2017-06-26 DIAGNOSIS — N852 Hypertrophy of uterus: Secondary | ICD-10-CM

## 2017-06-26 NOTE — Progress Notes (Signed)
    LUETTA PIAZZA 1975-07-05 [id number]        42 y.o.  [id number] S/P Tubal Ligation  RP: Menorrhagia with probable Fibroids for Pelvic US  HPI: Menorrhagia with secondary anemia.  Pelvic discomfort, pressure, pain.  Pain with IC.  Uterus enlarged/nodular on gynecologic exam 06/14/2017.  No desire to preserve fertility, s/p Tubal Ligation.   OB History  Gravida Para Term Preterm AB Living  5 4     1 4   SAB TAB Ectopic Multiple Live Births  1            # Outcome Date GA Lbr Len/2nd Weight Sex Delivery Anes PTL Lv  5 SAB           4 Para           3 Para           2 Para           1 Para               Past medical history,surgical history, problem list, medications, allergies, family history and social history were all reviewed and documented in the EPIC chart.   Directed ROS with pertinent positives and negatives documented in the history of present illness/assessment and plan.  Exam:  Vitals:   06/26/17 1240  BP: 128/84   General appearance:  Normal  Pelvic US today: T/V and T/A images.  Anteverted uterus measuring 13.27 x 11.10 x 10.38 cm.  Intramural fibroids measuring 9.3 x 9.5 x 7.4 cm, 3.6 x 2.8 cm, 3.5 x 3.3 cm.  Endometrial line displaced to the left.  Endometrial lining measuring 9.8 mm.  Right ovary normal.  Left ovary not well seen.  No free fluid in posterior cul-de-sac.   Assessment/Plan:  42 y.o. [id number]   1. Menorrhagia with regular cycle Heavy periods associated with large IM Myomas displacing the Endometrial cavity.  Secondary anemia.  2. Fibroids, intramural Many IM Myomas, largest 9.5 cm, causing menorrhagia with secondary anemia and pelvic pain/pressure/dyspareunia. Different hormonal treatments with Progestins discussed.  DepoLupron reviewed.  Conservative surgery including Myomectomies reviewed.  But patient is S/P Tubal Ligation, she has no desire to preserve Fertility potential, desiring a definitive surgical treatment.  Recommend Robotic TLH,  Bilateral Salpingectomy.  Surgery discussed, pamphlet given.  F/U Preop visit.  3. Secondary anemia Recent Hb 11.6.  Will start on Progestin only Pill to control Menorrhagia and improve Hb prior to surgery.  Other orders - norethindrone (MICRONOR,CAMILA,ERRIN) 0.35 MG tablet; Take 1 tablet (0.35 mg total) by mouth daily.  Counseling on above issues >50% x 25 minutes.  Princess Bruins MD, 12:45 PM 06/26/2017

## 2017-06-27 ENCOUNTER — Encounter: Payer: Self-pay | Admitting: Obstetrics & Gynecology

## 2017-06-27 MED ORDER — NORETHINDRONE 0.35 MG PO TABS: 1.0000 | ORAL_TABLET | Freq: Every day | ORAL | 0 refills | Status: DC

## 2017-06-27 NOTE — Patient Instructions (Addendum)
1. Menorrhagia with regular cycle Heavy periods associated with large IM Myomas displacing the Endometrial cavity.  Secondary anemia.  2. Fibroids, intramural Many IM Myomas, largest 9.5 cm, causing menorrhagia with secondary anemia and pelvic pain/pressure/dyspareunia. Different hormonal treatments with Progestins discussed.  DepoLupron reviewed.  Conservative surgery including Myomectomies reviewed.  But patient is S/P Tubal Ligation, she has no desire to preserve Fertility potential, desiring a definitive surgical treatment.  Recommend Robotic TLH, Bilateral Salpingectomy.  Surgery discussed, pamphlet given.  F/U Preop visit.  3. Secondary anemia Recent Hb 11.6.  Will start on Progestin only Pill to control Menorrhagia and improve Hb prior to surgery.  Other orders - norethindrone (MICRONOR,CAMILA,ERRIN) 0.35 MG tablet; Take 1 tablet (0.35 mg total) by mouth daily.  Sydney Vaughn, good seeing you today!  Total Laparoscopic Hysterectomy A total laparoscopic hysterectomy is a minimally invasive surgery to remove your uterus and cervix. This surgery is performed by making several small cuts (incisions) in your abdomen. It can also be done with a thin, lighted tube (laparoscope) inserted into two small incisions in your lower abdomen. Your fallopian tubes and ovaries can be removed (bilateral salpingo-oophorectomy) during this surgery as well.Benefits of minimally invasive surgery include:  Less pain.  Less risk of blood loss.  Less risk of infection.  Quicker return to normal activities.  Tell a health care provider about:  Any allergies you have.  All medicines you are taking, including vitamins, herbs, eye drops, creams, and over-the-counter medicines.  Any problems you or family members have had with anesthetic medicines.  Any blood disorders you have.  Any surgeries you have had.  Any medical conditions you have. What are the risks? Generally, this is a safe procedure. However, as  with any procedure, complications can occur. Possible complications include:  Bleeding.  Blood clots in the legs or lung.  Infection.  Injury to surrounding organs.  Problems with anesthesia.  Early menopause symptoms (hot flashes, night sweats, insomnia).  Risk of conversion to an open abdominal incision.  What happens before the procedure?  Ask your health care provider about changing or stopping your regular medicines.  Do not take aspirin or blood thinners (anticoagulants) for 1 week before the surgery or as told by your health care provider.  Do not eat or drink anything for 8 hours before the surgery or as told by your health care provider.  Quit smoking if you smoke.  Arrange for a ride home after surgery and for someone to help you at home during recovery. What happens during the procedure?  You will be given antibiotic medicine.  An IV tube will be placed in your arm. You will be given medicine to make you sleep (general anesthetic).  A gas (carbon dioxide) will be used to inflate your abdomen. This will allow your surgeon to look inside your abdomen, perform your surgery, and treat any other problems found if necessary.  Three or four small incisions (often less than 1/2 inch) will be made in your abdomen. One of these incisions will be made in the area of your belly button (navel). The laparoscope will be inserted into the incision. Your surgeon will look through the laparoscope while doing your procedure.  Other surgical instruments will be inserted through the other incisions.  Your uterus may be removed through your vagina or cut into small pieces and removed through the small incisions.  Your incisions will be closed. What happens after the procedure?  The gas will be released from inside your abdomen.  You will be taken to the recovery area where a nurse will watch and check your progress. Once you are awake, stable, and taking fluids well, without other  problems, you will return to your room or be allowed to go home.  There is usually minimal discomfort following the surgery because the incisions are so small.  You will be given pain medicine while you are in the hospital and for when you go home. This information is not intended to replace advice given to you by your health care provider. Make sure you discuss any questions you have with your health care provider. Document Released: 02/27/2007 Document Revised: 10/08/2015 Document Reviewed: 11/20/2012 Elsevier Interactive Patient Education  2017 [employer].

## 2017-06-29 ENCOUNTER — Encounter: Payer: Self-pay | Admitting: Obstetrics & Gynecology

## 2017-06-29 ENCOUNTER — Encounter: Payer: Self-pay | Admitting: Hematology and Oncology

## 2017-06-29 ENCOUNTER — Telehealth: Payer: Self-pay

## 2017-06-29 NOTE — Telephone Encounter (Signed)
Patient returned my call about scheduling surgery.   We discussed her ins benefits and her estimated surgery prepayment. Patient would like me to mail her a financial letter prior to surgery so that she and husband can review. That letter will be mailed out to her tomorrow. She will call me when ready to schedule.

## 2017-07-01 ENCOUNTER — Other Ambulatory Visit: Payer: Self-pay | Admitting: Endocrinology

## 2017-07-01 MED ORDER — TOPIRAMATE 25 MG PO TABS: ORAL_TABLET | ORAL | 2 refills | Status: DC

## 2017-08-13 DIAGNOSIS — J019 Acute sinusitis, unspecified: Secondary | ICD-10-CM | POA: Diagnosis not present

## 2017-08-14 ENCOUNTER — Other Ambulatory Visit: Payer: Self-pay | Admitting: Emergency Medicine

## 2017-08-15 ENCOUNTER — Other Ambulatory Visit: Payer: Self-pay | Admitting: Endocrinology

## 2017-08-15 DIAGNOSIS — R7989 Other specified abnormal findings of blood chemistry: Secondary | ICD-10-CM

## 2017-08-15 NOTE — Telephone Encounter (Signed)
Venlafaxine (Effexor) refill Last OV: 07/09/16 Last Refill:05/24/17 #90 1 RF

## 2017-08-21 DIAGNOSIS — R591 Generalized enlarged lymph nodes: Secondary | ICD-10-CM | POA: Diagnosis not present

## 2017-08-22 ENCOUNTER — Ambulatory Visit: Payer: Federal, State, Local not specified - PPO | Admitting: Emergency Medicine

## 2017-08-22 ENCOUNTER — Other Ambulatory Visit: Payer: Self-pay

## 2017-08-22 ENCOUNTER — Encounter: Payer: Self-pay | Admitting: Emergency Medicine

## 2017-08-22 VITALS — BP 118/58 | HR 103 | Temp 98.6°F | Resp 16 | Ht 63.5 in | Wt 180.8 lb

## 2017-08-22 DIAGNOSIS — M7918 Myalgia, other site: Secondary | ICD-10-CM

## 2017-08-22 DIAGNOSIS — M62838 Other muscle spasm: Secondary | ICD-10-CM

## 2017-08-22 DIAGNOSIS — M542 Cervicalgia: Secondary | ICD-10-CM | POA: Diagnosis not present

## 2017-08-22 DIAGNOSIS — S46811A Strain of other muscles, fascia and tendons at shoulder and upper arm level, right arm, initial encounter: Secondary | ICD-10-CM

## 2017-08-22 DIAGNOSIS — S161XXA Strain of muscle, fascia and tendon at neck level, initial encounter: Secondary | ICD-10-CM | POA: Diagnosis not present

## 2017-08-22 MED ORDER — DICLOFENAC SODIUM 75 MG PO TBEC: 75.0000 mg | DELAYED_RELEASE_TABLET | Freq: Two times a day (BID) | ORAL | 0 refills | Status: AC

## 2017-08-22 MED ORDER — CYCLOBENZAPRINE HCL 10 MG PO TABS: 10.0000 mg | ORAL_TABLET | Freq: Every day | ORAL | 0 refills | Status: DC

## 2017-08-22 NOTE — Progress Notes (Signed)
Sydney Vaughn 42 y.o.   Chief Complaint  Patient presents with  . Back Pain    chest with neck pain x 1 month  . Ear Pain    right xs 2 weeks    HISTORY OF PRESENT ILLNESS: This is a 42 y.o. female complaining of right-sided neck and upper back pain for several weeks.  Denies trauma or any injury.  Denies neurological symptoms.  Hurts to turn her head on the right side.  Complaining of some neck swelling with pain.  Has some chest and back pain that gets worse when lying down or taking a deep breath.  Was seen at the urgent care center about a week ago and told she had an ear infection.  Started on doxycycline.  Has occasional nausea with dizzy spells.  No other significant symptoms.  High stress personal life.  Cannot sleep very well.  Right ear pain for 2 weeks.  HPI   Prior to Admission medications   Medication Sig Start Date End Date Taking? Authorizing Provider  levothyroxine (SYNTHROID, LEVOTHROID) 50 MCG tablet 1 tablet alternating with 1-1/2 daily 06/06/17  Yes Elayne Snare, MD  levothyroxine (SYNTHROID, LEVOTHROID) 50 MCG tablet TAKE 1 TABLET BY MOUTH EVERY DAY 08/15/17  Yes Elayne Snare, MD  norethindrone (MICRONOR,CAMILA,ERRIN) 0.35 MG tablet Take 1 tablet (0.35 mg total) by mouth daily. 06/27/17  Yes Princess Bruins, MD  phentermine 15 MG capsule Take 1 capsule (15 mg total) by mouth every morning. 06/06/17  Yes Elayne Snare, MD  venlafaxine XR (EFFEXOR-XR) 75 MG 24 hr capsule TAKE 1 CAPSULE (75 MG TOTAL) BY MOUTH DAILY WITH BREAKFAST. 05/24/17  Yes Keoki Mchargue, Ines Bloomer, MD  topiramate (TOPAMAX) 25 MG tablet 1 tablet twice a day Patient not taking: Reported on 08/22/2017 07/01/17   Elayne Snare, MD    No Known Allergies  Patient Active Problem List   Diagnosis Date Noted  . Menorrhagia 04/21/2017  . Hair loss 04/20/2016  . Anxiety with depression 04/20/2016  . Vitamin D deficiency 12/14/2015  . Acquired hypothyroidism 12/14/2015  . Anemia, iron deficiency 12/03/2015  . Weight  gain 12/03/2015  . Fatigue 12/02/2015  . Tinnitus of left ear 11/23/2015  . Positive ANA (antinuclear antibody) 03/18/2014  . BMI 32.0-32.9,adult 12/11/2013    Past Medical History:  Diagnosis Date  . Adult acne   . ANA positive   . Anemia   . Asthma   . Chronic sinusitis    CT and MRI.   Marland Kitchen Iron deficiency   . Polyarthralgia    was seeing rheumatology    Past Surgical History:  Procedure Laterality Date  . APPENDECTOMY    . DILATION AND EVACUATION    . TUBAL LIGATION      Social History   Socioeconomic History  . Marital status: Married    Spouse name: Not on file  . Number of children: 4  . Years of education: Not on file  . Highest education level: Not on file  Occupational History    Employer: [employer]  Social Needs  . Financial resource strain: Not on file  . Food insecurity:    Worry: Not on file    Inability: Not on file  . Transportation needs:    Medical: Not on file    Non-medical: Not on file  Tobacco Use  . Smoking status: Never Smoker  . Smokeless tobacco: Never Used  Substance and Sexual Activity  . Alcohol use: No  Frequency: Never    Comment: occ   . Drug use: No  . Sexual activity: Yes    Partners: Male    Birth control/protection: Surgical    Comment: 1st intercourse- 50, partners- 67, married- 40 yrs   Lifestyle  . Physical activity:    Days per week: Not on file    Minutes per session: Not on file  . Stress: Not on file  Relationships  . Social connections:    Talks on phone: Not on file    Gets together: Not on file    Attends religious service: Not on file    Active member of club or organization: Not on file    Attends meetings of clubs or organizations: Not on file    Relationship status: Not on file  . Intimate partner violence:    Fear of current or ex partner: Not on file    Emotionally abused: Not on file    Physically abused: Not on file    Forced sexual activity: Not on file    Other Topics Concern  . Not on file  Social History Narrative   Ms. Macmullen lives in [place] with her husband and children. She has 4 daughters: ages ranging from 87 yo- 65 yo. 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      Family History  Problem Relation Age of Onset  . Thyroid disease Mother   . Sinusitis Sister   . Sinusitis Brother   . Lupus Daughter        treated at [facility]  . Allergies Daughter      Review of Systems  Constitutional: Negative.  Negative for chills and fever.  HENT: Positive for ear pain. Negative for congestion, nosebleeds and sinus pain.   Eyes: Negative.  Negative for blurred vision, double vision, discharge and redness.  Respiratory: Negative.  Negative for cough, hemoptysis and shortness of breath.   Cardiovascular: Positive for chest pain. Negative for palpitations and leg swelling.  Gastrointestinal: Positive for nausea. Negative for abdominal pain, diarrhea and vomiting.  Genitourinary: Positive for hematuria. Negative for dysuria.  Musculoskeletal: Positive for back pain and neck pain.  Skin: Negative.  Negative for rash.  Neurological: Positive for dizziness. Negative for sensory change and focal weakness.  Endo/Heme/Allergies: Negative.   All other systems reviewed and are negative.   Vitals:   08/22/17 1626  BP: (!) 118/58  Pulse: (!) 103  Resp: 16  Temp: 98.6 F (37 C)  SpO2: 100%    Physical Exam  Constitutional: She is oriented to person, place, and time. She appears well-developed and well-nourished.  HENT:  Head: Normocephalic and atraumatic.  Right Ear: Tympanic membrane, external ear and ear canal normal.  Left Ear: Tympanic membrane, external ear and ear canal normal.  Eyes: Pupils are equal, round, and reactive to light. Conjunctivae and EOM are normal.  Neck: Muscular tenderness present. No spinous process tenderness present. Decreased range of motion present. No erythema present.   Cardiovascular: Normal rate, regular rhythm and normal heart sounds.  Pulmonary/Chest: Effort normal and breath sounds normal. She exhibits tenderness (Anterior).  Musculoskeletal:  Positive tenderness to trapezius muscle right more the left.  Positive tenderness to right sternocleidomastoid muscle with mild swelling.  Neurological: She is alert and oriented to person, place, and time. No sensory deficit. She exhibits normal muscle tone. Coordination normal.  Skin: Skin is dry. Capillary refill takes less than 2 seconds.  Psychiatric: She has  a normal mood and affect. Her behavior is normal.  Vitals reviewed.   A total of 25 minutes was spent in the room with the patient, greater than 50% of which was in counseling/coordination of care.   ASSESSMENT & PLAN: Dexter was seen today for back pain and ear pain.  Diagnoses and all orders for this visit:  Musculoskeletal pain -     diclofenac (VOLTAREN) 75 MG EC tablet; Take 1 tablet (75 mg total) by mouth 2 (two) times daily for 5 days.  Neck pain  Strain of right trapezius muscle, initial encounter  Strain of sternocleidomastoid muscle, initial encounter  Muscle spasm -     cyclobenzaprine (FLEXERIL) 10 MG tablet; Take 1 tablet (10 mg total) by mouth at bedtime.    Patient Instructions       IF you received an x-ray today, you will receive an invoice from [facility]. Please contact [facility] at [phone] with questions or concerns regarding your invoice.   IF you received labwork today, you will receive an invoice from [facility]. Please contact [facility] at [phone] with questions or concerns regarding your invoice.   Our billing staff will not be able to assist you with questions regarding bills from these companies.  You will be contacted with the lab results as soon as they are available. The fastest way to get your results is to activate your My Chart account. Instructions are located on the last  page of this paperwork. If you have not heard from [place] regarding the results in 2 weeks, please contact this office.     Muscle Strain A muscle strain (pulled muscle) happens when a muscle is stretched beyond normal length. It happens when a sudden, violent force stretches your muscle too far. Usually, a few of the fibers in your muscle are torn. Muscle strain is common in athletes. Recovery usually takes 1-2 weeks. Complete healing takes 5-6 weeks. Follow these instructions at home:  Follow the PRICE method of treatment to help your injury get better. Do this the first 2-3 days after the injury: ? Protect. Protect the muscle to keep it from getting injured again. ? Rest. Limit your activity and rest the injured body part. ? Ice. Put ice in a plastic bag. Place a towel between your skin and the bag. Then, apply the ice and leave it on from 15-20 minutes each hour. After the third day, switch to moist heat packs. ? Compression. Use a splint or elastic bandage on the injured area for comfort. Do not put it on too tightly. ? Elevate. Keep the injured body part above the level of your heart.  Only take medicine as told by your doctor.  Warm up before doing exercise to prevent future muscle strains. Contact a doctor if:  You have more pain or puffiness (swelling) in the injured area.  You feel numbness, tingling, or notice a loss of strength in the injured area. This information is not intended to replace advice given to you by your health care provider. Make sure you discuss any questions you have with your health care provider. Document Released: 02/09/2008 Document Revised: 10/08/2015 Document Reviewed: 11/29/2012 Elsevier Interactive Patient Education  2017 [employer]      Agustina Caroli, MD [facility]

## 2017-08-22 NOTE — Patient Instructions (Addendum)
     IF you received an x-ray today, you will receive an invoice from [facility]. Please contact [facility] at [phone] with questions or concerns regarding your invoice.   IF you received labwork today, you will receive an invoice from [facility]. Please contact [facility] at [phone] with questions or concerns regarding your invoice.   Our billing staff will not be able to assist you with questions regarding bills from these companies.  You will be contacted with the lab results as soon as they are available. The fastest way to get your results is to activate your My Chart account. Instructions are located on the last page of this paperwork. If you have not heard from [place] regarding the results in 2 weeks, please contact this office.     Muscle Strain A muscle strain (pulled muscle) happens when a muscle is stretched beyond normal length. It happens when a sudden, violent force stretches your muscle too far. Usually, a few of the fibers in your muscle are torn. Muscle strain is common in athletes. Recovery usually takes 1-2 weeks. Complete healing takes 5-6 weeks. Follow these instructions at home:  Follow the PRICE method of treatment to help your injury get better. Do this the first 2-3 days after the injury: ? Protect. Protect the muscle to keep it from getting injured again. ? Rest. Limit your activity and rest the injured body part. ? Ice. Put ice in a plastic bag. Place a towel between your skin and the bag. Then, apply the ice and leave it on from 15-20 minutes each hour. After the third day, switch to moist heat packs. ? Compression. Use a splint or elastic bandage on the injured area for comfort. Do not put it on too tightly. ? Elevate. Keep the injured body part above the level of your heart.  Only take medicine as told by your doctor.  Warm up before doing exercise to prevent future muscle strains. Contact a doctor if:  You have more pain or puffiness  (swelling) in the injured area.  You feel numbness, tingling, or notice a loss of strength in the injured area. This information is not intended to replace advice given to you by your health care provider. Make sure you discuss any questions you have with your health care provider. Document Released: 02/09/2008 Document Revised: 10/08/2015 Document Reviewed: 11/29/2012 Elsevier Interactive Patient Education  2017 [employer].

## 2017-09-15 ENCOUNTER — Other Ambulatory Visit (INDEPENDENT_AMBULATORY_CARE_PROVIDER_SITE_OTHER): Payer: Federal, State, Local not specified - PPO

## 2017-09-15 DIAGNOSIS — E063 Autoimmune thyroiditis: Secondary | ICD-10-CM | POA: Diagnosis not present

## 2017-09-16 LAB — T4, FREE: FREE T4: 0.84 ng/dL (ref 0.60–1.60)

## 2017-09-16 LAB — T3, FREE: T3 FREE: 3.6 pg/mL (ref 2.3–4.2)

## 2017-09-16 LAB — TSH: TSH: 2.72 u[IU]/mL (ref 0.35–4.50)

## 2017-09-20 ENCOUNTER — Encounter: Payer: Self-pay | Admitting: Endocrinology

## 2017-09-26 ENCOUNTER — Ambulatory Visit: Payer: Federal, State, Local not specified - PPO | Admitting: Endocrinology

## 2017-09-26 NOTE — Progress Notes (Signed)
Patient ID: Sydney Vaughn, female   DOB: [birth year], 42 y.o.   MRN: [id number]             Reason for Appointment:  Follow-up    History of Present Illness:   WEIGHT management:  Previous history: She thinks her weight had been in the 170s a couple of years ago and has gained weight which she cannot shed Patient said that she has seen a dietitian in 2017 and is trying to modify her diet with taking salads for lunch, avoiding fried food usually She is trying to avoid snacking, trying to bake  foods as well as usually trying to avoid high-fat meats and avoiding sweets as well.  However she is usually trying to prepare food for her family with 4 children.   Her weight was about the same in July 2017  Since she had expressed difficulty with weight loss on her own with diet and exercise she was started on phentermine and Topamax on her initial visit in 07/2016   On her initial follow-up she had lost 18 pounds and had no side effects with the 2 drugs She was able to cut back on portions  RECENT history:  Since 2/18 she has gained 7 pounds She says she stopped taking Topamax about a month ago because she thought it was causing some twitching of her right eye which has improved Also she thinks she was having some difficulties with her memory which may be improving now Although she is trying to be somewhat active she is not doing any formal exercise  As before she is needing to cook for her family and may not always have the best diet   Again her insurance does not cover any brand-name weight loss medications   Wt Readings from Last 3 Encounters:  09/27/17 180 lb 9.6 oz (81.9 kg)  08/22/17 180 lb 12.8 oz (82 kg)  06/23/17 173 lb 9.6 oz (78.7 kg)    Hypothyroidism was first diagnosed in 11/2015  At the time of diagnosis patient was having symptoms of fatigue, weight gain and hair loss. She apparently has been having symptoms for several years the same way She has not had problems with  cold intolerance except sometimes her feet get cold Baseline TSH was 7.7 and she was started on levothyroxine 25 g daily  On her initial consultation she was having symptoms of fatigue, having difficulty losing weight, fatigue on exercising and some hair loss although not as much, this is despite her taking the levothyroxine Also her thyroid functions including free T3 were normal  Recent history:  She does still feel significantly fatigued, has not had her hemoglobin checked recently No somnolence or cold intolerance, no hair loss  In 05/2017 because of her high normal TSH she is now taking 75 mcg levothyroxine alternating with 15 Still does not feel any different with the dosage change TSH is now back to normal  TSH is low normal now  Thyroid function results have been as follows:  Lab Results  Component Value Date   TSH 2.72 09/15/2017   TSH 4.09 06/05/2017   TSH 4.19 02/02/2017   FREET4 0.84 09/15/2017   FREET4 0.75 06/05/2017   FREET4 0.66 02/02/2017      Past Medical History:  Diagnosis Date  . Adult acne   . ANA positive   . Anemia   . Asthma   . Chronic sinusitis    CT and MRI.   Marland Kitchen Iron deficiency   .  Polyarthralgia    was seeing rheumatology    Past Surgical History:  Procedure Laterality Date  . APPENDECTOMY    . DILATION AND EVACUATION    . TUBAL LIGATION      Family History  Problem Relation Age of Onset  . Thyroid disease Mother   . Sinusitis Sister   . Sinusitis Brother   . Lupus Daughter        treated at [facility]  . Allergies Daughter     Social History:  reports that she has never smoked. She has never used smokeless tobacco. She reports that she does not drink alcohol or use drugs.  Allergies: No Known Allergies  Allergies as of 09/27/2017   No Known Allergies     Medication List        Accurate as of 09/27/17  1:55 PM. Always use your most recent med list.          cyclobenzaprine 10 MG tablet Commonly known as:   FLEXERIL Take 1 tablet (10 mg total) by mouth at bedtime.   levothyroxine 50 MCG tablet Commonly known as:  SYNTHROID, LEVOTHROID 1 tablet alternating with 1-1/2 daily   phentermine 15 MG capsule Take 1 capsule (15 mg total) by mouth every morning.   topiramate 25 MG tablet Commonly known as:  TOPAMAX 1 tablet twice a day   venlafaxine XR 75 MG 24 hr capsule Commonly known as:  EFFEXOR-XR TAKE 1 CAPSULE (75 MG TOTAL) BY MOUTH DAILY WITH BREAKFAST.         Review of Systems      She says that occasionally she has an episode of rapid heartbeat for a couple of minutes, this is recent and has not discussed with PCP        Examination:    BP 124/76 (BP Location: Left Arm, Patient Position: Sitting, Cuff Size: Normal)   Pulse 88   Ht 5' 3.5" (1.613 m)   Wt 180 lb 9.6 oz (81.9 kg)   SpO2 99%   BMI 31.49 kg/m    Thyroid not palpable Repeat pulse 96 No tremor  Assessment: OBESITY:  With using phentermine and Topamax she previously had kept her weight down However she has gained weight this year and not able to maintain her diet and exercise regimen as discussed before She is also not wanting to take Topamax because of reported side effects of twitching of her eye and memory loss  More recently appears that she may have had some episodes of rapid heart rate and her resting heart rate appears to be relatively fast today also   HYPOTHYROIDISM, mild with highest TSH 7.7  Her TSH is mid normal with alternating 75 and 50 mcg of levothyroxine She has nonspecific fatigue which is persisting and this may be partly related to her anemia which she needs to be following up on   PLAN:   Stop phentermine because of episodes of tachycardia and appears to have mild increase in fasting heart rate today  She will be referred for consultation with weight loss management program Consistent aerobic exercise  We will follow-up with her hematologist  Continue same dose of thyroid  supplement and follow-up in 6 months  Aamina Skiff Dwyane Dee 09/27/2017, 1:55 PM     Note: This office note was prepared with Dragon voice recognition system technology. Any transcriptional errors that result from this process are unintentional.

## 2017-09-27 ENCOUNTER — Encounter: Payer: Self-pay | Admitting: Endocrinology

## 2017-09-27 ENCOUNTER — Ambulatory Visit: Payer: Federal, State, Local not specified - PPO | Admitting: Endocrinology

## 2017-09-27 VITALS — BP 124/76 | HR 88 | Ht 63.5 in | Wt 180.6 lb

## 2017-09-27 DIAGNOSIS — E669 Obesity, unspecified: Secondary | ICD-10-CM

## 2017-09-27 DIAGNOSIS — Z6832 Body mass index (BMI) 32.0-32.9, adult: Secondary | ICD-10-CM | POA: Diagnosis not present

## 2017-09-27 DIAGNOSIS — E063 Autoimmune thyroiditis: Secondary | ICD-10-CM | POA: Diagnosis not present

## 2017-09-27 DIAGNOSIS — E66811 Obesity, class 1: Secondary | ICD-10-CM

## 2017-10-02 ENCOUNTER — Other Ambulatory Visit: Payer: Self-pay | Admitting: Emergency Medicine

## 2017-10-02 DIAGNOSIS — M62838 Other muscle spasm: Secondary | ICD-10-CM

## 2017-10-03 NOTE — Telephone Encounter (Signed)
Patient called, left VM to return the call to the office to discuss the need for this medication, Flexeril. If she is not improved, it is noted in the [place] note to return to the office for symptoms not improved or worsening.

## 2017-10-04 ENCOUNTER — Ambulatory Visit: Payer: Federal, State, Local not specified - PPO | Admitting: Endocrinology

## 2017-10-11 ENCOUNTER — Inpatient Hospital Stay: Payer: Federal, State, Local not specified - PPO | Attending: Hematology and Oncology

## 2017-10-11 ENCOUNTER — Encounter: Payer: Self-pay | Admitting: Hematology and Oncology

## 2017-10-11 ENCOUNTER — Telehealth: Payer: Self-pay | Admitting: *Deleted

## 2017-10-11 DIAGNOSIS — D508 Other iron deficiency anemias: Secondary | ICD-10-CM

## 2017-10-11 DIAGNOSIS — N920 Excessive and frequent menstruation with regular cycle: Secondary | ICD-10-CM | POA: Diagnosis not present

## 2017-10-11 DIAGNOSIS — D509 Iron deficiency anemia, unspecified: Secondary | ICD-10-CM | POA: Insufficient documentation

## 2017-10-11 LAB — CBC WITH DIFFERENTIAL (CANCER CENTER ONLY)
Basophils Absolute: 0 10*3/uL (ref 0.0–0.1)
Basophils Relative: 1 %
Eosinophils Absolute: 0.1 10*3/uL (ref 0.0–0.5)
Eosinophils Relative: 2 %
HCT: 26.6 % — ABNORMAL LOW (ref 34.8–46.6)
Hemoglobin: 7.5 g/dL — ABNORMAL LOW (ref 11.6–15.9)
Lymphocytes Relative: 25 %
Lymphs Abs: 1.6 10*3/uL (ref 0.9–3.3)
MCH: 19.7 pg — ABNORMAL LOW (ref 25.1–34.0)
MCHC: 28.2 g/dL — ABNORMAL LOW (ref 31.5–36.0)
MCV: 70 fL — ABNORMAL LOW (ref 79.5–101.0)
Monocytes Absolute: 0.5 10*3/uL (ref 0.1–0.9)
Monocytes Relative: 8 %
Neutro Abs: 4.1 10*3/uL (ref 1.5–6.5)
Neutrophils Relative %: 64 %
Platelet Count: 407 10*3/uL — ABNORMAL HIGH (ref 145–400)
RBC: 3.8 MIL/uL (ref 3.70–5.45)
RDW: 18 % — ABNORMAL HIGH (ref 11.2–14.5)
WBC Count: 6.3 10*3/uL (ref 3.9–10.3)

## 2017-10-11 NOTE — Telephone Encounter (Signed)
Called patient regarding MyChart message. Dr Alvy Bimler would like her to come in today or tomorrow for labs. Pt can come today at 3:00pm

## 2017-10-12 ENCOUNTER — Other Ambulatory Visit: Payer: Self-pay | Admitting: Hematology and Oncology

## 2017-10-12 ENCOUNTER — Encounter: Payer: Self-pay | Admitting: Hematology and Oncology

## 2017-10-12 ENCOUNTER — Encounter: Payer: Self-pay | Admitting: Emergency Medicine

## 2017-10-12 ENCOUNTER — Inpatient Hospital Stay: Payer: Federal, State, Local not specified - PPO

## 2017-10-12 ENCOUNTER — Other Ambulatory Visit: Payer: Self-pay

## 2017-10-12 ENCOUNTER — Other Ambulatory Visit: Payer: Self-pay | Admitting: Medical Oncology

## 2017-10-12 ENCOUNTER — Telehealth: Payer: Self-pay

## 2017-10-12 DIAGNOSIS — D509 Iron deficiency anemia, unspecified: Secondary | ICD-10-CM | POA: Diagnosis not present

## 2017-10-12 DIAGNOSIS — D508 Other iron deficiency anemias: Secondary | ICD-10-CM

## 2017-10-12 DIAGNOSIS — N920 Excessive and frequent menstruation with regular cycle: Secondary | ICD-10-CM | POA: Diagnosis not present

## 2017-10-12 LAB — CBC WITH DIFFERENTIAL/PLATELET
Basophils Absolute: 0.1 10*3/uL (ref 0.0–0.1)
Basophils Relative: 1 %
EOS PCT: 3 %
Eosinophils Absolute: 0.2 10*3/uL (ref 0.0–0.5)
HEMATOCRIT: 26.2 % — AB (ref 34.8–46.6)
Hemoglobin: 7.9 g/dL — ABNORMAL LOW (ref 11.6–15.9)
LYMPHS ABS: 1.5 10*3/uL (ref 0.9–3.3)
LYMPHS PCT: 25 %
MCH: 19.8 pg — AB (ref 25.1–34.0)
MCHC: 30.1 g/dL — ABNORMAL LOW (ref 31.5–36.0)
MCV: 65.9 fL — ABNORMAL LOW (ref 79.5–101.0)
MONO ABS: 0.4 10*3/uL (ref 0.1–0.9)
Monocytes Relative: 7 %
Neutro Abs: 4 10*3/uL (ref 1.5–6.5)
Neutrophils Relative %: 64 %
PLATELETS: 485 10*3/uL — AB (ref 145–400)
RBC: 3.98 MIL/uL (ref 3.70–5.45)
RDW: 19.2 % — AB (ref 11.2–14.5)
WBC: 6.1 10*3/uL (ref 3.9–10.3)

## 2017-10-12 LAB — IRON AND TIBC
Iron: 13 ug/dL — ABNORMAL LOW (ref 41–142)
Saturation Ratios: 3 % — ABNORMAL LOW (ref 21–57)
TIBC: 409 ug/dL (ref 236–444)
UIBC: 395 ug/dL

## 2017-10-12 LAB — FERRITIN: Ferritin: <4 ng/mL — ABNORMAL LOW (ref 9–269)

## 2017-10-12 LAB — PREPARE RBC (CROSSMATCH)

## 2017-10-12 MED ORDER — SODIUM CHLORIDE 0.9% FLUSH
3.0000 mL | INTRAVENOUS | Status: DC | PRN
  Filled 2017-10-12: qty 10

## 2017-10-12 NOTE — Telephone Encounter (Signed)
Called and given below message. She wants to get which ever one will make her feel better the quickest. She will take both if needed and do whatever Dr. Alvy Bimler recommends.  She will wait for the office to call back.

## 2017-10-12 NOTE — Telephone Encounter (Signed)
-----   Message from Heath Lark, MD sent at 10/12/2017  7:52 AM EDT ----- Regarding: blood or iron Can you call and ask if she wants blood or iron? ----- Message ----- From: Interface, Lab In [place] Sent: 10/11/2017   3:30 PM To: Heath Lark, MD

## 2017-10-12 NOTE — Telephone Encounter (Signed)
Called back per Dr. Alvy Bimler. Sent scheduling message. For blood transfusion on 6/1 at 0830. Instructed her to do not remove blood bracelet and on Saturday to walk back to the infusion room. She verbalized understanding.

## 2017-10-12 NOTE — Telephone Encounter (Signed)
Called and left message. Ask her to call back and speak with nurse.

## 2017-10-13 ENCOUNTER — Other Ambulatory Visit: Payer: Self-pay

## 2017-10-13 DIAGNOSIS — D508 Other iron deficiency anemias: Secondary | ICD-10-CM

## 2017-10-13 LAB — ABO/RH: ABO/RH(D): O POS

## 2017-10-14 ENCOUNTER — Inpatient Hospital Stay: Payer: Federal, State, Local not specified - PPO | Attending: Hematology and Oncology

## 2017-10-14 DIAGNOSIS — N920 Excessive and frequent menstruation with regular cycle: Secondary | ICD-10-CM | POA: Diagnosis not present

## 2017-10-14 DIAGNOSIS — D508 Other iron deficiency anemias: Secondary | ICD-10-CM | POA: Insufficient documentation

## 2017-10-14 MED ORDER — ACETAMINOPHEN 325 MG PO TABS
650.0000 mg | ORAL_TABLET | Freq: Once | ORAL | Status: AC
  Administered 2017-10-14: 650 mg via ORAL

## 2017-10-14 MED ORDER — SODIUM CHLORIDE 0.9 % IV SOLN
250.0000 mL | Freq: Once | INTRAVENOUS | Status: AC
  Administered 2017-10-14: 250 mL via INTRAVENOUS

## 2017-10-14 MED ORDER — DIPHENHYDRAMINE HCL 25 MG PO CAPS
25.0000 mg | ORAL_CAPSULE | Freq: Once | ORAL | Status: AC
  Administered 2017-10-14: 25 mg via ORAL

## 2017-10-14 MED ORDER — DIPHENHYDRAMINE HCL 25 MG PO CAPS
ORAL_CAPSULE | ORAL | Status: AC
  Filled 2017-10-14: qty 1

## 2017-10-14 MED ORDER — ACETAMINOPHEN 325 MG PO TABS
ORAL_TABLET | ORAL | Status: AC
  Filled 2017-10-14: qty 2

## 2017-10-14 NOTE — Patient Instructions (Signed)

## 2017-10-16 LAB — BPAM RBC
BLOOD PRODUCT EXPIRATION DATE: 201906262359
ISSUE DATE / TIME: 201906010930
UNIT TYPE AND RH: 5100

## 2017-10-16 LAB — TYPE AND SCREEN
ABO/RH(D): O POS
ANTIBODY SCREEN: NEGATIVE
UNIT DIVISION: 0

## 2017-10-19 ENCOUNTER — Other Ambulatory Visit: Payer: Self-pay | Admitting: *Deleted

## 2017-10-19 ENCOUNTER — Encounter: Payer: Self-pay | Admitting: Hematology and Oncology

## 2017-10-20 ENCOUNTER — Inpatient Hospital Stay: Payer: Federal, State, Local not specified - PPO

## 2017-10-20 VITALS — BP 121/62 | HR 74 | Temp 98.6°F | Resp 17

## 2017-10-20 DIAGNOSIS — N920 Excessive and frequent menstruation with regular cycle: Secondary | ICD-10-CM | POA: Diagnosis not present

## 2017-10-20 DIAGNOSIS — D508 Other iron deficiency anemias: Secondary | ICD-10-CM

## 2017-10-20 MED ORDER — SODIUM CHLORIDE 0.9 % IV SOLN
510.0000 mg | Freq: Once | INTRAVENOUS | Status: AC
  Administered 2017-10-20: 510 mg via INTRAVENOUS
  Filled 2017-10-20: qty 17

## 2017-10-20 MED ORDER — SODIUM CHLORIDE 0.9 % IV SOLN
Freq: Once | INTRAVENOUS | Status: AC
  Administered 2017-10-20: 09:00:00 via INTRAVENOUS

## 2017-10-20 NOTE — Patient Instructions (Addendum)
Ferumoxytol injection What is this medicine? FERUMOXYTOL is an iron complex. Iron is used to make healthy red blood cells, which carry oxygen and nutrients throughout the body. This medicine is used to treat iron deficiency anemia in people with chronic kidney disease. This medicine may be used for other purposes; ask your health care provider or pharmacist if you have questions. COMMON BRAND NAME(S): Feraheme What should I tell my health care provider before I take this medicine? They need to know if you have any of these conditions: -anemia not caused by low iron levels -high levels of iron in the blood -magnetic resonance imaging (MRI) test scheduled -an unusual or allergic reaction to iron, other medicines, foods, dyes, or preservatives -pregnant or trying to get pregnant -breast-feeding How should I use this medicine? This medicine is for injection into a vein. It is given by a health care professional in a hospital or clinic setting. Talk to your pediatrician regarding the use of this medicine in children. Special care may be needed. Overdosage: If you think you have taken too much of this medicine contact a poison control center or emergency room at once. NOTE: This medicine is only for you. Do not share this medicine with others. What if I miss a dose? It is important not to miss your dose. Call your doctor or health care professional if you are unable to keep an appointment. What may interact with this medicine? This medicine may interact with the following medications: -other iron products This list may not describe all possible interactions. Give your health care provider a list of all the medicines, herbs, non-prescription drugs, or dietary supplements you use. Also tell them if you smoke, drink alcohol, or use illegal drugs. Some items may interact with your medicine. What should I watch for while using this medicine? Visit your doctor or healthcare professional regularly. Tell  your doctor or healthcare professional if your symptoms do not start to get better or if they get worse. You may need blood work done while you are taking this medicine. You may need to follow a special diet. Talk to your doctor. Foods that contain iron include: whole grains/cereals, dried fruits, beans, or peas, leafy green vegetables, and organ meats (liver, kidney). What side effects may I notice from receiving this medicine? Side effects that you should report to your doctor or health care professional as soon as possible: -allergic reactions like skin rash, itching or hives, swelling of the face, lips, or tongue -breathing problems -changes in blood pressure -feeling faint or lightheaded, falls -fever or chills -flushing, sweating, or hot feelings -swelling of the ankles or feet Side effects that usually do not require medical attention (report to your doctor or health care professional if they continue or are bothersome): -diarrhea -headache -nausea, vomiting -stomach pain This list may not describe all possible side effects. Call your doctor for medical advice about side effects. You may report side effects to FDA at 1-800-FDA-1088. Where should I keep my medicine? This drug is given in a hospital or clinic and will not be stored at home. NOTE: This sheet is a summary. It may not cover all possible information. If you have questions about this medicine, talk to your doctor, pharmacist, or health care provider.  2018 Elsevier/Gold Standard (2015-06-04 12:41:49)   Excuse from Work, School, or Physical Activity Sydney Melanny Wire Tyler_ needs to be excused from: __X__ Work  beginning now and through the following date:    10/20/2017.  Health Care Provider  Name (printed):      Elray Buba, RN  For  Dr.  Heath Lark. Health Care Provider (signature): ___________________________________________ Date: ________________ This information is not intended to replace advice given to you by your  health care provider. Make sure you discuss any questions you have with your health care provider. Document Released: 10/26/2000 Document Revised: 04/15/2016 Document Reviewed: 12/02/2013 Elsevier Interactive Patient Education  [employer].

## 2017-10-25 ENCOUNTER — Other Ambulatory Visit: Payer: Self-pay | Admitting: Hematology and Oncology

## 2017-10-25 DIAGNOSIS — D508 Other iron deficiency anemias: Secondary | ICD-10-CM

## 2017-10-27 ENCOUNTER — Encounter: Payer: Self-pay | Admitting: Hematology and Oncology

## 2017-10-27 ENCOUNTER — Telehealth: Payer: Self-pay

## 2017-10-27 ENCOUNTER — Inpatient Hospital Stay: Payer: Federal, State, Local not specified - PPO

## 2017-10-27 VITALS — BP 109/69 | HR 73 | Temp 99.6°F | Resp 16

## 2017-10-27 DIAGNOSIS — N920 Excessive and frequent menstruation with regular cycle: Secondary | ICD-10-CM | POA: Diagnosis not present

## 2017-10-27 DIAGNOSIS — D508 Other iron deficiency anemias: Secondary | ICD-10-CM

## 2017-10-27 MED ORDER — FERUMOXYTOL INJECTION 510 MG/17 ML
510.0000 mg | Freq: Once | INTRAVENOUS | Status: AC
  Administered 2017-10-27: 510 mg via INTRAVENOUS
  Filled 2017-10-27: qty 17

## 2017-10-27 NOTE — Telephone Encounter (Signed)
Called regarding mychart message needing to change infusion appt time. Given 3 pm appt for today. Scheduling message sent.

## 2017-10-27 NOTE — Patient Instructions (Signed)

## 2017-11-23 ENCOUNTER — Encounter: Payer: Self-pay | Admitting: Hematology and Oncology

## 2017-11-24 ENCOUNTER — Ambulatory Visit: Payer: Federal, State, Local not specified - PPO | Admitting: Hematology and Oncology

## 2017-11-24 ENCOUNTER — Other Ambulatory Visit: Payer: Federal, State, Local not specified - PPO

## 2017-11-24 ENCOUNTER — Telehealth: Payer: Self-pay

## 2017-11-24 NOTE — Telephone Encounter (Signed)
Today's appts cancelled dt email sent by patient on 11/23/17 to cancel.

## 2017-12-02 DIAGNOSIS — J019 Acute sinusitis, unspecified: Secondary | ICD-10-CM | POA: Diagnosis not present

## 2017-12-02 DIAGNOSIS — H66002 Acute suppurative otitis media without spontaneous rupture of ear drum, left ear: Secondary | ICD-10-CM | POA: Diagnosis not present

## 2017-12-07 ENCOUNTER — Encounter (INDEPENDENT_AMBULATORY_CARE_PROVIDER_SITE_OTHER): Payer: Federal, State, Local not specified - PPO

## 2017-12-13 ENCOUNTER — Encounter (INDEPENDENT_AMBULATORY_CARE_PROVIDER_SITE_OTHER): Payer: Self-pay

## 2017-12-13 ENCOUNTER — Ambulatory Visit (INDEPENDENT_AMBULATORY_CARE_PROVIDER_SITE_OTHER): Payer: Federal, State, Local not specified - PPO | Admitting: Family Medicine

## 2017-12-27 ENCOUNTER — Ambulatory Visit (INDEPENDENT_AMBULATORY_CARE_PROVIDER_SITE_OTHER): Payer: Federal, State, Local not specified - PPO | Admitting: Family Medicine

## 2017-12-27 ENCOUNTER — Encounter (INDEPENDENT_AMBULATORY_CARE_PROVIDER_SITE_OTHER): Payer: Self-pay

## 2018-01-19 DIAGNOSIS — M542 Cervicalgia: Secondary | ICD-10-CM | POA: Diagnosis not present

## 2018-02-05 ENCOUNTER — Other Ambulatory Visit: Payer: Self-pay | Admitting: Endocrinology

## 2018-02-05 DIAGNOSIS — R7989 Other specified abnormal findings of blood chemistry: Secondary | ICD-10-CM

## 2018-02-21 ENCOUNTER — Encounter: Payer: Self-pay | Admitting: Hematology and Oncology

## 2018-02-22 ENCOUNTER — Encounter: Payer: Self-pay | Admitting: Anesthesiology

## 2018-02-22 ENCOUNTER — Other Ambulatory Visit: Payer: Self-pay | Admitting: Hematology and Oncology

## 2018-02-22 ENCOUNTER — Telehealth: Payer: Self-pay | Admitting: Hematology and Oncology

## 2018-02-22 NOTE — Telephone Encounter (Signed)
Tried to call regarding 10/11 I did leave a message

## 2018-02-23 ENCOUNTER — Other Ambulatory Visit: Payer: Federal, State, Local not specified - PPO

## 2018-02-23 ENCOUNTER — Ambulatory Visit: Payer: Federal, State, Local not specified - PPO | Admitting: Hematology and Oncology

## 2018-02-23 ENCOUNTER — Telehealth: Payer: Self-pay | Admitting: *Deleted

## 2018-02-23 NOTE — Telephone Encounter (Signed)
Left message on home/cell for a return call. Need to speak to patient about missed appointments today and check status on how she is feeling.

## 2018-02-27 ENCOUNTER — Encounter: Payer: Self-pay | Admitting: Hematology and Oncology

## 2018-02-28 ENCOUNTER — Telehealth: Payer: Self-pay | Admitting: Hematology and Oncology

## 2018-02-28 NOTE — Telephone Encounter (Signed)
Appts scheduled patient notified per 10/16 sch msg

## 2018-03-08 ENCOUNTER — Telehealth: Payer: Self-pay

## 2018-03-08 ENCOUNTER — Inpatient Hospital Stay: Payer: Federal, State, Local not specified - PPO | Attending: Hematology and Oncology

## 2018-03-08 ENCOUNTER — Telehealth: Payer: Self-pay | Admitting: Hematology and Oncology

## 2018-03-08 ENCOUNTER — Inpatient Hospital Stay (HOSPITAL_BASED_OUTPATIENT_CLINIC_OR_DEPARTMENT_OTHER): Payer: Federal, State, Local not specified - PPO | Admitting: Hematology and Oncology

## 2018-03-08 ENCOUNTER — Encounter: Payer: Self-pay | Admitting: Hematology and Oncology

## 2018-03-08 DIAGNOSIS — D508 Other iron deficiency anemias: Secondary | ICD-10-CM

## 2018-03-08 DIAGNOSIS — Z79899 Other long term (current) drug therapy: Secondary | ICD-10-CM | POA: Diagnosis not present

## 2018-03-08 DIAGNOSIS — N920 Excessive and frequent menstruation with regular cycle: Secondary | ICD-10-CM

## 2018-03-08 DIAGNOSIS — D509 Iron deficiency anemia, unspecified: Secondary | ICD-10-CM

## 2018-03-08 DIAGNOSIS — D259 Leiomyoma of uterus, unspecified: Secondary | ICD-10-CM | POA: Insufficient documentation

## 2018-03-08 LAB — SAMPLE TO BLOOD BANK

## 2018-03-08 LAB — CBC WITH DIFFERENTIAL/PLATELET
ABS IMMATURE GRANULOCYTES: 0.03 10*3/uL (ref 0.00–0.07)
BASOS ABS: 0.1 10*3/uL (ref 0.0–0.1)
BASOS PCT: 1 %
Eosinophils Absolute: 0.2 10*3/uL (ref 0.0–0.5)
Eosinophils Relative: 3 %
HCT: 27.7 % — ABNORMAL LOW (ref 36.0–46.0)
Hemoglobin: 8 g/dL — ABNORMAL LOW (ref 12.0–15.0)
IMMATURE GRANULOCYTES: 1 %
LYMPHS ABS: 1.9 10*3/uL (ref 0.7–4.0)
Lymphocytes Relative: 29 %
MCH: 22.9 pg — ABNORMAL LOW (ref 26.0–34.0)
MCHC: 28.9 g/dL — ABNORMAL LOW (ref 30.0–36.0)
MCV: 79.4 fL — AB (ref 80.0–100.0)
MONOS PCT: 9 %
Monocytes Absolute: 0.6 10*3/uL (ref 0.1–1.0)
NEUTROS ABS: 3.8 10*3/uL (ref 1.7–7.7)
NRBC: 0 % (ref 0.0–0.2)
Neutrophils Relative %: 57 %
PLATELETS: 385 10*3/uL (ref 150–400)
RBC: 3.49 MIL/uL — ABNORMAL LOW (ref 3.87–5.11)
RDW: 16 % — ABNORMAL HIGH (ref 11.5–15.5)
WBC: 6.5 10*3/uL (ref 4.0–10.5)

## 2018-03-08 LAB — IRON AND TIBC
IRON: 13 ug/dL — AB (ref 41–142)
SATURATION RATIOS: 3 % — AB (ref 21–57)
TIBC: 419 ug/dL (ref 236–444)
UIBC: 406 ug/dL

## 2018-03-08 LAB — FERRITIN: Ferritin: <4 ng/mL — ABNORMAL LOW (ref 11–307)

## 2018-03-08 NOTE — Assessment & Plan Note (Signed)
She has severe, recurrent iron deficiency anemia again She has made informed decision to proceed with hysterectomy The most likely cause of her anemia is due to chronic blood loss/malabsorption syndrome. We discussed some of the risks, benefits, and alternatives of intravenous iron infusions. The patient is symptomatic from anemia and the iron level is critically low. She tolerated oral iron supplement poorly and desires to achieved higher levels of iron faster for adequate hematopoesis. Some of the side-effects to be expected including risks of infusion reactions, phlebitis, headaches, nausea and fatigue.  The patient is willing to proceed. Patient education material was dispensed.  Goal is to keep ferritin level greater than 50 I recommend 2 doses of intravenous iron That should help correcting her anemia prior to surgery After surgery, I recommend she asked her gynecologist to repeat postop CBC I will call her in December for repeat blood count and follow-up.

## 2018-03-08 NOTE — Telephone Encounter (Signed)
-----   Message from Heath Lark, MD sent at 03/08/2018  2:28 PM EDT ----- Regarding: iron is low pls let her know test results ----- Message ----- From: Interface, Lab In [place] Sent: 03/08/2018  12:32 PM EDT To: Heath Lark, MD

## 2018-03-08 NOTE — Telephone Encounter (Signed)
Called and given below message. She verbalized understanding. 

## 2018-03-08 NOTE — Assessment & Plan Note (Signed)
She was evaluated by her gynecologist and was found to have a uterine fibroid She plan for partial hysterectomy which I think is reasonable option There is no contraindication from hematology standpoint for her to proceed with hysterectomy

## 2018-03-08 NOTE — Progress Notes (Signed)
[place] OFFICE PROGRESS NOTE  Vaughn, Sydney Bloomer, MD  ASSESSMENT & PLAN:  Anemia, iron deficiency She has severe, recurrent iron deficiency anemia again She has made informed decision to proceed with hysterectomy The most likely cause of her anemia is due to chronic blood loss/malabsorption syndrome. We discussed some of the risks, benefits, and alternatives of intravenous iron infusions. The patient is symptomatic from anemia and the iron level is critically low. She tolerated oral iron supplement poorly and desires to achieved higher levels of iron faster for adequate hematopoesis. Some of the side-effects to be expected including risks of infusion reactions, phlebitis, headaches, nausea and fatigue.  The patient is willing to proceed. Patient education material was dispensed.  Goal is to keep ferritin level greater than 50 I recommend 2 doses of intravenous iron That should help correcting her anemia prior to surgery After surgery, I recommend she asked her gynecologist to repeat postop CBC I will call her in December for repeat blood count and follow-up.   Menorrhagia She was evaluated by her gynecologist and was found to have a uterine fibroid She plan for partial hysterectomy which I think is reasonable option There is no contraindication from hematology standpoint for her to proceed with hysterectomy   No orders of the defined types were placed in this encounter.   INTERVAL HISTORY: Sydney Vaughn 42 y.o. female returns for further follow-up She complained of excessive fatigue again Denies chest pain or shortness of breath She tolerated last intravenous iron infusion well She is scheduled to undergo total hysterectomy next month  SUMMARY OF HEMATOLOGIC HISTORY:  Sydney Vaughn is here because of recent diagnosis of severe iron deficiency anemia  She was found to have abnormal CBC from recent blood work. Her baseline blood work in July 2015 was within  normal limits. Most recently, she complained of profound fatigue. She had recent severe microcytic anemia with confirmed iron deficiency from blood work. She  had recent chest pain on exertion, shortness of breath on minimal exertion, pre-syncopal episodes, leg cramps and palpitations. She had not noticed any recent bleeding such as epistaxis, hematuria or hematochezia The patient denies over the counter NSAID ingestion. She is not on antiplatelets agents.  She had no prior history or diagnosis of cancer. Her age appropriate screening programs are up-to-date. She denies any pica and eats a variety of diet. She donated blood many years ago but has never received blood transfusion The patient was prescribed oral iron supplements and she takes 1 a day but that caused significant nausea, vomiting and abdominal bloating. She has 4 children and was told she was anemic in the past. The patient was placed on prednisone 2 years ago for possible autoimmune disorder but that was discontinued. She complained of profound fatigue and was recently diagnosed with hypothyroidism. She denies history of menorrhagia although she have passage of clots during menstruation several months ago. She was supposed she have vitamin D deficiency in the past but has not been placed on vitamin D supplement consistently She received multiple intravenous iron since 2017  I have reviewed the past medical history, past surgical history, social history and family history with the patient and they are unchanged from previous note.  ALLERGIES:  has No Known Allergies.  MEDICATIONS:  Current Outpatient Medications  Medication Sig Dispense Refill  . cyclobenzaprine (FLEXERIL) 10 MG tablet Take 1 tablet (10 mg total) by mouth at bedtime. 30 tablet 0  . levothyroxine (SYNTHROID, LEVOTHROID) 50 MCG tablet  TAKE 1 TABLET ALTERNATING WITH 1-1/2 TABLETS BY MOUTH EVERY OTHER DAY DAILY 100 tablet 1  . venlafaxine XR (EFFEXOR-XR) 75 MG 24  hr capsule TAKE 1 CAPSULE (75 MG TOTAL) BY MOUTH DAILY WITH BREAKFAST. 90 capsule 1   No current facility-administered medications for this visit.      REVIEW OF SYSTEMS:   Constitutional: Denies fevers, chills or night sweats Eyes: Denies blurriness of vision Ears, nose, mouth, throat, and face: Denies mucositis or sore throat Respiratory: Denies cough, dyspnea or wheezes Cardiovascular: Denies palpitation, chest discomfort or lower extremity swelling Gastrointestinal:  Denies nausea, heartburn or change in bowel habits Skin: Denies abnormal skin rashes Lymphatics: Denies new lymphadenopathy or easy bruising Neurological:Denies numbness, tingling or new weaknesses Behavioral/Psych: Mood is stable, no new changes  All other systems were reviewed with the patient and are negative.  PHYSICAL EXAMINATION: ECOG PERFORMANCE STATUS: 1 - Symptomatic but completely ambulatory  Vitals:   03/08/18 1239  BP: 115/65  Pulse: 96  Resp: 18  Temp: 98.4 F (36.9 C)  SpO2: 100%   Filed Weights   03/08/18 1239  Weight: 191 lb (86.6 kg)    GENERAL:alert, no distress and comfortable Musculoskeletal:no cyanosis of digits and no clubbing  NEURO: alert & oriented x 3 with fluent speech, no focal motor/sensory deficits  LABORATORY DATA:  I have reviewed the data as listed     Component Value Date/Time   NA 139 07/09/2016 1052   K 4.4 07/09/2016 1052   CL 103 07/09/2016 1052   CO2 22 07/09/2016 1052   GLUCOSE 88 07/09/2016 1052   GLUCOSE 95 04/20/2016 1632   BUN 7 07/09/2016 1052   CREATININE 0.71 07/09/2016 1052   CREATININE 0.95 04/20/2016 1632   CALCIUM 8.5 (L) 07/09/2016 1052   PROT 6.7 07/09/2016 1052   ALBUMIN 4.1 07/09/2016 1052   AST 18 07/09/2016 1052   ALT 14 07/09/2016 1052   ALKPHOS 81 07/09/2016 1052   BILITOT 0.4 07/09/2016 1052   GFRNONAA 105 07/09/2016 1052   GFRNONAA 74 04/20/2016 1632   GFRAA 121 07/09/2016 1052   GFRAA 85 04/20/2016 1632    No results found  for: SPEP, UPEP  Lab Results  Component Value Date   WBC 6.5 03/08/2018   NEUTROABS 3.8 03/08/2018   HGB 8.0 (L) 03/08/2018   HCT 27.7 (L) 03/08/2018   MCV Vaughn.4 (L) 03/08/2018   PLT 385 03/08/2018      Chemistry      Component Value Date/Time   NA 139 07/09/2016 1052   K 4.4 07/09/2016 1052   CL 103 07/09/2016 1052   CO2 22 07/09/2016 1052   BUN 7 07/09/2016 1052   CREATININE 0.71 07/09/2016 1052   CREATININE 0.95 04/20/2016 1632      Component Value Date/Time   CALCIUM 8.5 (L) 07/09/2016 1052   ALKPHOS 81 07/09/2016 1052   AST 18 07/09/2016 1052   ALT 14 07/09/2016 1052   BILITOT 0.4 07/09/2016 1052       I spent 10 minutes counseling the patient face to face. The total time spent in the appointment was 15 minutes and more than 50% was on counseling.   All questions were answered. The patient knows to call the clinic with any problems, questions or concerns. No barriers to learning was detected.    Heath Lark, MD 10/24/20193:30 PM

## 2018-03-08 NOTE — Telephone Encounter (Signed)
Gave avs and calendar ° °

## 2018-03-12 ENCOUNTER — Inpatient Hospital Stay: Payer: Federal, State, Local not specified - PPO

## 2018-03-12 VITALS — BP 123/66 | HR 89 | Temp 99.1°F | Resp 18

## 2018-03-12 DIAGNOSIS — D508 Other iron deficiency anemias: Secondary | ICD-10-CM

## 2018-03-12 DIAGNOSIS — N920 Excessive and frequent menstruation with regular cycle: Secondary | ICD-10-CM | POA: Diagnosis not present

## 2018-03-12 DIAGNOSIS — D259 Leiomyoma of uterus, unspecified: Secondary | ICD-10-CM | POA: Diagnosis not present

## 2018-03-12 DIAGNOSIS — Z79899 Other long term (current) drug therapy: Secondary | ICD-10-CM | POA: Diagnosis not present

## 2018-03-12 DIAGNOSIS — D509 Iron deficiency anemia, unspecified: Secondary | ICD-10-CM | POA: Diagnosis not present

## 2018-03-12 MED ORDER — SODIUM CHLORIDE 0.9 % IV SOLN
510.0000 mg | Freq: Once | INTRAVENOUS | Status: AC
  Administered 2018-03-12: 510 mg via INTRAVENOUS
  Filled 2018-03-12: qty 17

## 2018-03-12 MED ORDER — SODIUM CHLORIDE 0.9 % IV SOLN
Freq: Once | INTRAVENOUS | Status: AC
  Administered 2018-03-12: 10:00:00 via INTRAVENOUS
  Filled 2018-03-12: qty 250

## 2018-03-12 NOTE — Patient Instructions (Signed)

## 2018-03-16 ENCOUNTER — Ambulatory Visit: Payer: Federal, State, Local not specified - PPO | Admitting: Obstetrics & Gynecology

## 2018-03-16 ENCOUNTER — Encounter: Payer: Self-pay | Admitting: Obstetrics & Gynecology

## 2018-03-16 VITALS — BP 118/78

## 2018-03-16 DIAGNOSIS — D219 Benign neoplasm of connective and other soft tissue, unspecified: Secondary | ICD-10-CM | POA: Diagnosis not present

## 2018-03-16 DIAGNOSIS — R102 Pelvic and perineal pain: Secondary | ICD-10-CM

## 2018-03-16 DIAGNOSIS — D649 Anemia, unspecified: Secondary | ICD-10-CM | POA: Diagnosis not present

## 2018-03-16 DIAGNOSIS — N920 Excessive and frequent menstruation with regular cycle: Secondary | ICD-10-CM

## 2018-03-16 MED ORDER — MEGESTROL ACETATE 40 MG PO TABS: 40.0000 mg | ORAL_TABLET | Freq: Every day | ORAL | 1 refills | Status: DC

## 2018-03-16 NOTE — Progress Notes (Signed)
Sydney Vaughn [birth year] [id number]        42 y.o.  [id number] Married.  S/P TL.    RP: Preop Robotic TLH/Bilateral Salpingectomy  HPI: Patient was evaluated with a pelvic ultrasound in February 2019.  At that time her uterus was enlarged with the largest fibroid measuring 9.5 cm.  Since then, patient feels more pressure and pain towards the right side.  Her menstrual periods are still regular, but very heavy causing secondary anemia with a hemoglobin of 8.0 recently.  Patient received IV iron this week and a second dose is planned for next week.   OB History  Gravida Para Term Preterm AB Living  5 4     1 4   SAB TAB Ectopic Multiple Live Births  1            # Outcome Date GA Lbr Len/2nd Weight Sex Delivery Anes PTL Lv  5 SAB           4 Para           3 Para           2 Para           1 Para             Past medical history,surgical history, problem list, medications, allergies, family history and social history were all reviewed and documented in the EPIC chart.   Directed ROS with pertinent positives and negatives documented in the history of present illness/assessment and plan.  Exam:  Vitals:   03/16/18 1501  BP: 118/78   General appearance:  Normal  Pelvic US 06/26/2017:   T/V and T/A images.  Anteverted uterus measuring 13.27 x 11.10 x 10.38 cm.  Intramural fibroids measuring 9.3 x 9.5 x 7.4 cm, 3.6 x 2.8 cm, 3.5 x 3.3 cm.  Endometrial line displaced to the left.  Endometrial lining measuring 9.8 mm.  Right ovary normal.  Left ovary not well seen.  No free fluid in posterior cul-de-sac.   Assessment/Plan:  42 y.o. [id number]   1. Fibroids Patient is ready to proceed with hysterectomy.  Given that her menorrhagia with secondary anemia and pelvic pain have increased since last ultrasound in February 2019, decision made to reevaluate with a pelvic ultrasound prior to surgery in December.  Patient just received IV iron.  Will control her coming menstrual periods with  Megace until surgery.  Usage, risks and benefits of Megace discussed.  Prescription sent to pharmacy.  At the follow-up pelvic ultrasound, will recheck her hemoglobin.  Planning a robotic total laparoscopic hysterectomy with bilateral salpingectomies.  Procedure, risks and benefits thoroughly reviewed with patient including the risks of conversion to an open procedure, hemorrhage/blood transfusion, trauma to internal organs, infection, blood clots and anesthesia complications.  Will complete the preop information at the next visit. - US Transvaginal Non-OB; Future  2. Menorrhagia with regular cycle As above. - CBC; Future - US Transvaginal Non-OB; Future  3. Secondary anemia Recent hemoglobin at 8.0.  Receiving IV iron x2 transfusions.  Will control the amount of menstrual flow with Megace until surgery.  Will recheck hemoglobin prior to surgery. - CBC; Future  4. Pelvic pain in female Given the worsening in pelvic pain especially towards the right side, decision made to reevaluate by pelvic ultrasound prior to surgery. - US Transvaginal Non-OB; Future  Other orders - megestrol (MEGACE) 40 MG tablet; Take 1 tablet (40 mg total) by mouth daily. Take 2 tab daily if increased vaginal  bleeding.  Counseling on above issues and coordination of care more than 50% for 25 minutes.  Princess Bruins MD, 3:06 PM 03/16/2018

## 2018-03-18 ENCOUNTER — Encounter: Payer: Self-pay | Admitting: Obstetrics & Gynecology

## 2018-03-18 NOTE — Patient Instructions (Signed)
1. Fibroids Patient is ready to proceed with hysterectomy.  Given that her menorrhagia with secondary anemia and pelvic pain have increased since last ultrasound in February 2019, decision made to reevaluate with a pelvic ultrasound prior to surgery in December.  Patient just received IV iron.  Will control her coming menstrual periods with Megace until surgery.  Usage, risks and benefits of Megace discussed.  Prescription sent to pharmacy.  At the follow-up pelvic ultrasound, will recheck her hemoglobin.  Planning a robotic total laparoscopic hysterectomy with bilateral salpingectomies.  Procedure, risks and benefits thoroughly reviewed with patient including the risks of conversion to an open procedure, hemorrhage/blood transfusion, trauma to internal organs, infection, blood clots and anesthesia complications.  Will complete the preop information at the next visit. - US Transvaginal Non-OB; Future  2. Menorrhagia with regular cycle As above. - CBC; Future - US Transvaginal Non-OB; Future  3. Secondary anemia Recent hemoglobin at 8.0.  Receiving IV iron x2 transfusions.  Will control the amount of menstrual flow with Megace until surgery.  Will recheck hemoglobin prior to surgery. - CBC; Future  4. Pelvic pain in female Given the worsening in pelvic pain especially towards the right side, decision made to reevaluate by pelvic ultrasound prior to surgery. - US Transvaginal Non-OB; Future  Other orders - megestrol (MEGACE) 40 MG tablet; Take 1 tablet (40 mg total) by mouth daily. Take 2 tab daily if increased vaginal bleeding.  Sindia, it was a pleasure seeing you today!  I will inform you of your results as soon as they are available.

## 2018-03-19 ENCOUNTER — Telehealth: Payer: Self-pay | Admitting: Hematology and Oncology

## 2018-03-19 ENCOUNTER — Telehealth: Payer: Self-pay

## 2018-03-19 ENCOUNTER — Encounter: Payer: Self-pay | Admitting: Hematology and Oncology

## 2018-03-19 ENCOUNTER — Inpatient Hospital Stay: Payer: Federal, State, Local not specified - PPO

## 2018-03-19 NOTE — Telephone Encounter (Signed)
Called regarding mychart message regarding getting IV Feraheme today. Per Dr. Jana Hakim, there is no reason to not get the IV Iron today while you are on your cycle. She would prefer to wait until she is off of her cycle. Scheduling message to reschedule today appt to Friday.

## 2018-03-19 NOTE — Telephone Encounter (Signed)
R/s appt per 11/4 sch message- pt is aware of new appt date and time.

## 2018-03-23 ENCOUNTER — Inpatient Hospital Stay: Payer: Federal, State, Local not specified - PPO | Attending: Hematology and Oncology

## 2018-03-23 VITALS — BP 111/72 | HR 81 | Temp 97.9°F | Resp 16

## 2018-03-23 DIAGNOSIS — D509 Iron deficiency anemia, unspecified: Secondary | ICD-10-CM | POA: Diagnosis not present

## 2018-03-23 DIAGNOSIS — D508 Other iron deficiency anemias: Secondary | ICD-10-CM

## 2018-03-23 MED ORDER — SODIUM CHLORIDE 0.9 % IV SOLN
510.0000 mg | Freq: Once | INTRAVENOUS | Status: AC
  Administered 2018-03-23: 510 mg via INTRAVENOUS
  Filled 2018-03-23: qty 17

## 2018-03-23 MED ORDER — SODIUM CHLORIDE 0.9 % IV SOLN
Freq: Once | INTRAVENOUS | Status: AC
  Administered 2018-03-23: 09:00:00 via INTRAVENOUS
  Filled 2018-03-23: qty 250

## 2018-03-23 NOTE — Patient Instructions (Signed)

## 2018-03-29 ENCOUNTER — Telehealth: Payer: Self-pay

## 2018-03-29 NOTE — Telephone Encounter (Signed)
Good morning,I have an appointment for an ultrasound 04/11/18 that I requested to have. I would like to cancel. I was having pain on my right side That's why I requested the appointment. However I was not aware at the time I was about to start my cycle. Since that cycle has came and gone I have not had any pain nor issues  DR. ML-you ordered u/s at her last visit. SHe is schduled for Robotic Assisted Total hysterectomy with Salpingectomy on 04/18/18  Ok to cancel u/s at patient's request?

## 2018-03-30 ENCOUNTER — Ambulatory Visit: Payer: Federal, State, Local not specified - PPO | Admitting: Endocrinology

## 2018-03-30 NOTE — Telephone Encounter (Signed)
I emailed patient back in My Chart and let her know that I cancelled u/s.

## 2018-03-30 NOTE — Telephone Encounter (Signed)
Given no pain anymore and a Pelvic US done 06/2017, ok to cancel pelvic US.

## 2018-04-06 ENCOUNTER — Encounter (HOSPITAL_COMMUNITY): Payer: Self-pay

## 2018-04-09 ENCOUNTER — Encounter (HOSPITAL_COMMUNITY): Payer: Self-pay

## 2018-04-09 NOTE — Patient Instructions (Signed)
Your procedure is scheduled on:  Wednesday, Dec. 4, 2019   Surgery Time: 8:30AM-11:30AM   Report to [facility] Main  Entrance    Report to admitting at 6:30 AM   Call this number if you have problems the morning of surgery [phone]   Do not eat food or drink liquids :After Midnight.   Brush your teeth the morning of surgery.   Do NOT smoke after Midnight   Take these medicines the morning of surgery with A SIP OF WATER: Levothyroxine, Venlafaxine   Bring asthma inhaler day of surgery              You may not have any metal on your body including hair pins, jewelry, and body piercings             Do not wear make-up, lotions, powders, perfumes/cologne, or deodorant             Do not wear nail polish.  Do not shave  48 hours prior to surgery.                Do not bring valuables to the hospital. [place].   Contacts, dentures or bridgework may not be worn into surgery.   Leave suitcase in the car. After surgery it may be brought to your room.    Special Instructions: Bring a copy of your healthcare power of attorney and living will documents         the day of surgery if you haven't scanned them in before.              Please read over the following fact sheets you were given:  [facility] - Preparing for Surgery Before surgery, you can play an important role.  Because skin is not sterile, your skin needs to be as free of germs as possible.  You can reduce the number of germs on your skin by washing with CHG (chlorahexidine gluconate) soap before surgery.  CHG is an antiseptic cleaner which kills germs and bonds with the skin to continue killing germs even after washing. Please DO NOT use if you have an allergy to CHG or antibacterial soaps.  If your skin becomes reddened/irritated stop using the CHG and inform your nurse when you arrive at Short Stay. Do not shave (including legs and underarms) for at least  48 hours prior to the first CHG shower.  You may shave your face/neck.  Please follow these instructions carefully:  1.  Shower with CHG Soap the night before surgery and the  morning of surgery.  2.  If you choose to wash your hair, wash your hair first as usual with your normal  shampoo.  3.  After you shampoo, rinse your hair and body thoroughly to remove the shampoo.                             4.  Use CHG as you would any other liquid soap.  You can apply chg directly to the skin and wash.  Gently with a scrungie or clean washcloth.  5.  Apply the CHG Soap to your body ONLY FROM THE NECK DOWN.   Do   not use on face/ open  Wound or open sores. Avoid contact with eyes, ears mouth and   genitals (private parts).                       Wash face,  Genitals (private parts) with your normal soap.             6.  Wash thoroughly, paying special attention to the area where your    surgery  will be performed.  7.  Thoroughly rinse your body with warm water from the neck down.  8.  DO NOT shower/wash with your normal soap after using and rinsing off the CHG Soap.                9.  Pat yourself dry with a clean towel.            10.  Wear clean pajamas.            11.  Place clean sheets on your bed the night of your first shower and do not  sleep with pets. Day of Surgery : Do not apply any lotions/deodorants the morning of surgery.  Please wear clean clothes to the hospital/surgery center.  FAILURE TO FOLLOW THESE INSTRUCTIONS MAY RESULT IN THE CANCELLATION OF YOUR SURGERY  PATIENT SIGNATURE_________________________________  NURSE SIGNATURE__________________________________  ________________________________________________________________________   Sydney Vaughn  An incentive spirometer is a tool that can help keep your lungs clear and active. This tool measures how well you are filling your lungs with each breath. Taking long deep breaths may help reverse  or decrease the chance of developing breathing (pulmonary) problems (especially infection) following:  A long period of time when you are unable to move or be active. BEFORE THE PROCEDURE   If the spirometer includes an indicator to show your best effort, your nurse or respiratory therapist will set it to a desired goal.  If possible, sit up straight or lean slightly forward. Try not to slouch.  Hold the incentive spirometer in an upright position. INSTRUCTIONS FOR USE  1. Sit on the edge of your bed if possible, or sit up as far as you can in bed or on a chair. 2. Hold the incentive spirometer in an upright position. 3. Breathe out normally. 4. Place the mouthpiece in your mouth and seal your lips tightly around it. 5. Breathe in slowly and as deeply as possible, raising the piston or the ball toward the top of the column. 6. Hold your breath for 3-5 seconds or for as long as possible. Allow the piston or ball to fall to the bottom of the column. 7. Remove the mouthpiece from your mouth and breathe out normally. 8. Rest for a few seconds and repeat Steps 1 through 7 at least 10 times every 1-2 hours when you are awake. Take your time and take a few normal breaths between deep breaths. 9. The spirometer may include an indicator to show your best effort. Use the indicator as a goal to work toward during each repetition. 10. After each set of 10 deep breaths, practice coughing to be sure your lungs are clear. If you have an incision (the cut made at the time of surgery), support your incision when coughing by placing a pillow or rolled up towels firmly against it. Once you are able to get out of bed, walk around indoors and cough well. You may stop using the incentive spirometer when instructed by your caregiver.  RISKS AND COMPLICATIONS  Take your time  so you do not get dizzy or light-headed.  If you are in pain, you may need to take or ask for pain medication before doing incentive  spirometry. It is harder to take a deep breath if you are having pain. AFTER USE  Rest and breathe slowly and easily.  It can be helpful to keep track of a log of your progress. Your caregiver can provide you with a simple table to help with this. If you are using the spirometer at home, follow these instructions: Sydney Vaughn IF:   You are having difficultly using the spirometer.  You have trouble using the spirometer as often as instructed.  Your pain medication is not giving enough relief while using the spirometer.  You develop fever of 100.5 F (38.1 C) or higher. SEEK IMMEDIATE MEDICAL CARE IF:   You cough up bloody sputum that had not been present before.  You develop fever of 102 F (38.9 C) or greater.  You develop worsening pain at or near the incision site. MAKE SURE YOU:   Understand these instructions.  Will watch your condition.  Will get help right away if you are not doing well or get worse. Document Released: 09/12/2006 Document Revised: 07/25/2011 Document Reviewed: 11/13/2006 Exit[employer] Patient Information 2014 [employer], [place].   ________________________________________________________________________  WHAT IS A BLOOD TRANSFUSION? Blood Transfusion Information  A transfusion is the replacement of blood or some of its parts. Blood is made up of multiple cells which provide different functions.  Red blood cells carry oxygen and are used for blood loss replacement.  White blood cells fight against infection.  Platelets control bleeding.  Plasma helps clot blood.  Other blood products are available for specialized needs, such as hemophilia or other clotting disorders. BEFORE THE TRANSFUSION  Who gives blood for transfusions?   Healthy volunteers who are fully evaluated to make sure their blood is safe. This is blood bank blood. Transfusion therapy is the safest it has ever been in the practice of medicine. Before blood is taken from a donor, a  complete history is taken to make sure that person has no history of diseases nor engages in risky social behavior (examples are intravenous drug use or sexual activity with multiple partners). The donor's travel history is screened to minimize risk of transmitting infections, such as malaria. The donated blood is tested for signs of infectious diseases, such as HIV and hepatitis. The blood is then tested to be sure it is compatible with you in order to minimize the chance of a transfusion reaction. If you or a relative donates blood, this is often done in anticipation of surgery and is not appropriate for emergency situations. It takes many days to process the donated blood. RISKS AND COMPLICATIONS Although transfusion therapy is very safe and saves many lives, the main dangers of transfusion include:   Getting an infectious disease.  Developing a transfusion reaction. This is an allergic reaction to something in the blood you were given. Every precaution is taken to prevent this. The decision to have a blood transfusion has been considered carefully by your caregiver before blood is given. Blood is not given unless the benefits outweigh the risks. AFTER THE TRANSFUSION  Right after receiving a blood transfusion, you will usually feel much better and more energetic. This is especially true if your red blood cells have gotten low (anemic). The transfusion raises the level of the red blood cells which carry oxygen, and this usually causes an energy increase.  The  nurse administering the transfusion will monitor you carefully for complications. HOME CARE INSTRUCTIONS  No special instructions are needed after a transfusion. You may find your energy is better. Speak with your caregiver about any limitations on activity for underlying diseases you may have. SEEK MEDICAL CARE IF:   Your condition is not improving after your transfusion.  You develop redness or irritation at the intravenous (IV)  site. SEEK IMMEDIATE MEDICAL CARE IF:  Any of the following symptoms occur over the next 12 hours:  Shaking chills.  You have a temperature by mouth above 102 F (38.9 C), not controlled by medicine.  Chest, back, or muscle pain.  People around you feel you are not acting correctly or are confused.  Shortness of breath or difficulty breathing.  Dizziness and fainting.  You get a rash or develop hives.  You have a decrease in urine output.  Your urine turns a dark color or changes to pink, red, or brown. Any of the following symptoms occur over the next 10 days:  You have a temperature by mouth above 102 F (38.9 C), not controlled by medicine.  Shortness of breath.  Weakness after normal activity.  The white part of the eye turns yellow (jaundice).  You have a decrease in the amount of urine or are urinating less often.  Your urine turns a dark color or changes to pink, red, or brown. Document Released: 04/29/2000 Document Revised: 07/25/2011 Document Reviewed: 12/17/2007 [facility] Patient Information 2014 [place], [place].  _______________________________________________________________________

## 2018-04-10 ENCOUNTER — Encounter (HOSPITAL_COMMUNITY)
Admission: RE | Admit: 2018-04-10 | Discharge: 2018-04-10 | Disposition: A | Discharge status: Home / Self Care | Payer: Federal, State, Local not specified - PPO | Source: Ambulatory Visit | Attending: Obstetrics & Gynecology | Admitting: Obstetrics & Gynecology

## 2018-04-10 ENCOUNTER — Encounter (HOSPITAL_COMMUNITY): Payer: Self-pay

## 2018-04-10 ENCOUNTER — Other Ambulatory Visit: Payer: Self-pay

## 2018-04-10 DIAGNOSIS — R102 Pelvic and perineal pain: Secondary | ICD-10-CM | POA: Insufficient documentation

## 2018-04-10 DIAGNOSIS — N920 Excessive and frequent menstruation with regular cycle: Secondary | ICD-10-CM | POA: Diagnosis not present

## 2018-04-10 DIAGNOSIS — Z01812 Encounter for preprocedural laboratory examination: Secondary | ICD-10-CM | POA: Insufficient documentation

## 2018-04-10 HISTORY — DX: Nausea with vomiting, unspecified: R11.2

## 2018-04-10 HISTORY — DX: Unspecified osteoarthritis, unspecified site: M19.90

## 2018-04-10 HISTORY — DX: Personal history of other medical treatment: Z92.89

## 2018-04-10 HISTORY — DX: Fracture of unspecified metatarsal bone(s), unspecified foot, initial encounter for closed fracture: S92.309A

## 2018-04-10 HISTORY — DX: Intramural leiomyoma of uterus: D25.1

## 2018-04-10 HISTORY — DX: Fibromyalgia: M79.7

## 2018-04-10 HISTORY — DX: Panic disorder (episodic paroxysmal anxiety): F41.0

## 2018-04-10 HISTORY — DX: Pelvic and perineal pain: R10.2

## 2018-04-10 HISTORY — DX: Hypothyroidism, unspecified: E03.9

## 2018-04-10 HISTORY — DX: Obesity, unspecified: E66.9

## 2018-04-10 HISTORY — DX: Other specified postprocedural states: Z98.890

## 2018-04-10 HISTORY — DX: Personal history of other diseases of the digestive system: Z87.19

## 2018-04-10 HISTORY — DX: Excessive and frequent menstruation with regular cycle: N92.0

## 2018-04-10 HISTORY — DX: Unspecified fracture of left foot, initial encounter for closed fracture: S92.902A

## 2018-04-10 LAB — CBC
HEMATOCRIT: 40.6 % (ref 36.0–46.0)
HEMOGLOBIN: 12.4 g/dL (ref 12.0–15.0)
MCH: 27.3 pg (ref 26.0–34.0)
MCHC: 30.5 g/dL (ref 30.0–36.0)
MCV: 89.4 fL (ref 80.0–100.0)
NRBC: 0 % (ref 0.0–0.2)
Platelets: 291 10*3/uL (ref 150–400)
RBC: 4.54 MIL/uL (ref 3.87–5.11)
WBC: 6.8 10*3/uL (ref 4.0–10.5)

## 2018-04-11 ENCOUNTER — Ambulatory Visit: Payer: Federal, State, Local not specified - PPO | Admitting: Obstetrics & Gynecology

## 2018-04-11 ENCOUNTER — Other Ambulatory Visit: Payer: Federal, State, Local not specified - PPO

## 2018-04-11 DIAGNOSIS — Z0289 Encounter for other administrative examinations: Secondary | ICD-10-CM

## 2018-04-15 HISTORY — PX: ABDOMINAL HYSTERECTOMY: SHX81

## 2018-04-17 NOTE — Pre-Procedure Instructions (Signed)
Left message on voicemail to notify of time change. Ms. Garnett told to arrive at 6AM 04/18/2018.

## 2018-04-17 NOTE — Anesthesia Preprocedure Evaluation (Addendum)
Anesthesia Evaluation  Patient identified by MRN, date of birth, ID band Patient awake    Reviewed: Allergy & Precautions, Patient's Chart, lab work & pertinent test results  History of Anesthesia Complications (+) PONV and history of anesthetic complications  Airway Mallampati: I  TM Distance: >3 FB Neck ROM: Full    Dental no notable dental hx. (+) Teeth Intact, Dental Advisory Given   Pulmonary asthma ,    Pulmonary exam normal breath sounds clear to auscultation       Cardiovascular negative cardio ROS Normal cardiovascular exam Rhythm:Regular Rate:Normal     Neuro/Psych PSYCHIATRIC DISORDERS Anxiety Depression negative neurological ROS     GI/Hepatic negative GI ROS, Neg liver ROS,   Endo/Other  Hypothyroidism   Renal/GU negative Renal ROS  negative genitourinary   Musculoskeletal  (+) Arthritis , Fibromyalgia -  Abdominal   Peds  Hematology  (+) Blood dyscrasia, anemia ,   Anesthesia Other Findings Fibroids, menorrhagia  Reproductive/Obstetrics                           Anesthesia Physical Anesthesia Plan  ASA: III  Anesthesia Plan: General   Post-op Pain Management:    Induction: Intravenous  PONV Risk Score and Plan: 4 or greater and Midazolam, Scopolamine patch - Pre-op, Dexamethasone and Ondansetron  Airway Management Planned: Oral ETT  Additional Equipment:   Intra-op Plan:   Post-operative Plan: Extubation in OR  Informed Consent: I have reviewed the patients History and Physical, chart, labs and discussed the procedure including the risks, benefits and alternatives for the proposed anesthesia with the patient or authorized representative who has indicated his/her understanding and acceptance.   Dental advisory given  Plan Discussed with: CRNA  Anesthesia Plan Comments:         Anesthesia Quick Evaluation

## 2018-04-18 ENCOUNTER — Ambulatory Visit (HOSPITAL_BASED_OUTPATIENT_CLINIC_OR_DEPARTMENT_OTHER): Payer: Federal, State, Local not specified - PPO | Admitting: Anesthesiology

## 2018-04-18 ENCOUNTER — Ambulatory Visit (HOSPITAL_BASED_OUTPATIENT_CLINIC_OR_DEPARTMENT_OTHER)
Admission: RE | Admit: 2018-04-18 | Discharge: 2018-04-19 | Disposition: A | Discharge status: Home / Self Care | Payer: Federal, State, Local not specified - PPO | Source: Ambulatory Visit | Attending: Obstetrics & Gynecology | Admitting: Obstetrics & Gynecology

## 2018-04-18 ENCOUNTER — Encounter: Payer: Self-pay | Admitting: Anesthesiology

## 2018-04-18 ENCOUNTER — Encounter (HOSPITAL_COMMUNITY)
Admission: RE | Disposition: A | Discharge status: Home / Self Care | Payer: Self-pay | Source: Ambulatory Visit | Attending: Obstetrics & Gynecology

## 2018-04-18 ENCOUNTER — Encounter (HOSPITAL_COMMUNITY): Payer: Self-pay | Admitting: Emergency Medicine

## 2018-04-18 DIAGNOSIS — D649 Anemia, unspecified: Secondary | ICD-10-CM | POA: Diagnosis not present

## 2018-04-18 DIAGNOSIS — D259 Leiomyoma of uterus, unspecified: Secondary | ICD-10-CM | POA: Diagnosis not present

## 2018-04-18 DIAGNOSIS — R102 Pelvic and perineal pain: Secondary | ICD-10-CM | POA: Insufficient documentation

## 2018-04-18 DIAGNOSIS — D509 Iron deficiency anemia, unspecified: Secondary | ICD-10-CM | POA: Diagnosis not present

## 2018-04-18 DIAGNOSIS — N920 Excessive and frequent menstruation with regular cycle: Secondary | ICD-10-CM | POA: Diagnosis not present

## 2018-04-18 DIAGNOSIS — F418 Other specified anxiety disorders: Secondary | ICD-10-CM | POA: Diagnosis not present

## 2018-04-18 DIAGNOSIS — N800 Endometriosis of uterus: Secondary | ICD-10-CM | POA: Diagnosis not present

## 2018-04-18 DIAGNOSIS — Z9889 Other specified postprocedural states: Secondary | ICD-10-CM

## 2018-04-18 DIAGNOSIS — N838 Other noninflammatory disorders of ovary, fallopian tube and broad ligament: Secondary | ICD-10-CM | POA: Diagnosis not present

## 2018-04-18 LAB — ABO/RH: ABO/RH(D): O POS

## 2018-04-18 LAB — TYPE AND SCREEN
ABO/RH(D): O POS
ANTIBODY SCREEN: NEGATIVE

## 2018-04-18 LAB — PREGNANCY, URINE: PREG TEST UR: NEGATIVE

## 2018-04-18 SURGERY — XI ROBOTIC ASSISTED TOTAL HYSTERECTOMY WITH SALPINGECTOMY
Anesthesia: General | Laterality: Bilateral

## 2018-04-18 MED ORDER — ACETAMINOPHEN 325 MG PO TABS: 650.0000 mg | ORAL_TABLET | ORAL | Status: DC | PRN

## 2018-04-18 MED ORDER — ARTIFICIAL TEARS OPHTHALMIC OINT
TOPICAL_OINTMENT | OPHTHALMIC | Status: AC
  Filled 2018-04-18: qty 3.5

## 2018-04-18 MED ORDER — ROCURONIUM BROMIDE 10 MG/ML (PF) SYRINGE
PREFILLED_SYRINGE | INTRAVENOUS | Status: AC
  Filled 2018-04-18: qty 10

## 2018-04-18 MED ORDER — DEXAMETHASONE SODIUM PHOSPHATE 10 MG/ML IJ SOLN
INTRAMUSCULAR | Status: AC
  Filled 2018-04-18: qty 1

## 2018-04-18 MED ORDER — KETAMINE HCL 10 MG/ML IJ SOLN
INTRAMUSCULAR | Status: DC | PRN
  Administered 2018-04-18: 10 mg via INTRAVENOUS
  Administered 2018-04-18: 25 mg via INTRAVENOUS

## 2018-04-18 MED ORDER — BUPIVACAINE HCL (PF) 0.25 % IJ SOLN
INTRAMUSCULAR | Status: AC
  Filled 2018-04-18: qty 30

## 2018-04-18 MED ORDER — MIDAZOLAM HCL 2 MG/2ML IJ SOLN
INTRAMUSCULAR | Status: AC
  Filled 2018-04-18: qty 2

## 2018-04-18 MED ORDER — KETAMINE HCL 10 MG/ML IJ SOLN
INTRAMUSCULAR | Status: AC
  Filled 2018-04-18: qty 1

## 2018-04-18 MED ORDER — PROPOFOL 10 MG/ML IV BOLUS
INTRAVENOUS | Status: AC
  Filled 2018-04-18: qty 40

## 2018-04-18 MED ORDER — APREPITANT 40 MG PO CAPS
40.0000 mg | ORAL_CAPSULE | Freq: Once | ORAL | Status: AC
  Administered 2018-04-18: 40 mg via ORAL
  Filled 2018-04-18: qty 1

## 2018-04-18 MED ORDER — FENTANYL CITRATE (PF) 100 MCG/2ML IJ SOLN
INTRAMUSCULAR | Status: DC | PRN
  Administered 2018-04-18 (×4): 50 ug via INTRAVENOUS
  Administered 2018-04-18: 100 ug via INTRAVENOUS
  Administered 2018-04-18: 25 ug via INTRAVENOUS
  Administered 2018-04-18: 50 ug via INTRAVENOUS

## 2018-04-18 MED ORDER — ACETAMINOPHEN 10 MG/ML IV SOLN
INTRAVENOUS | Status: AC
  Filled 2018-04-18: qty 100

## 2018-04-18 MED ORDER — KETOROLAC TROMETHAMINE 30 MG/ML IJ SOLN
INTRAMUSCULAR | Status: DC | PRN
  Administered 2018-04-18: 30 mg via INTRAVENOUS

## 2018-04-18 MED ORDER — MIDAZOLAM HCL 2 MG/2ML IJ SOLN
INTRAMUSCULAR | Status: DC | PRN
  Administered 2018-04-18 (×2): 2 mg via INTRAVENOUS

## 2018-04-18 MED ORDER — ONDANSETRON HCL 4 MG/2ML IJ SOLN
INTRAMUSCULAR | Status: AC
  Filled 2018-04-18: qty 2

## 2018-04-18 MED ORDER — PROPOFOL 10 MG/ML IV BOLUS
INTRAVENOUS | Status: DC | PRN
  Administered 2018-04-18: 100 mg via INTRAVENOUS
  Administered 2018-04-18: 50 mg via INTRAVENOUS

## 2018-04-18 MED ORDER — LACTATED RINGERS IV SOLN
INTRAVENOUS | Status: DC
  Administered 2018-04-18: 22:00:00 via INTRAVENOUS

## 2018-04-18 MED ORDER — FENTANYL CITRATE (PF) 100 MCG/2ML IJ SOLN
INTRAMUSCULAR | Status: AC
  Filled 2018-04-18: qty 2

## 2018-04-18 MED ORDER — LIDOCAINE 2% (20 MG/ML) 5 ML SYRINGE
INTRAMUSCULAR | Status: AC
  Filled 2018-04-18: qty 5

## 2018-04-18 MED ORDER — ONDANSETRON HCL 4 MG/2ML IJ SOLN
INTRAMUSCULAR | Status: DC | PRN
  Administered 2018-04-18: 4 mg via INTRAVENOUS

## 2018-04-18 MED ORDER — FENTANYL CITRATE (PF) 100 MCG/2ML IJ SOLN
25.0000 ug | INTRAMUSCULAR | Status: DC | PRN
  Administered 2018-04-18 (×3): 50 ug via INTRAVENOUS

## 2018-04-18 MED ORDER — ACETAMINOPHEN 10 MG/ML IV SOLN
INTRAVENOUS | Status: DC | PRN
  Administered 2018-04-18: 1000 mg via INTRAVENOUS

## 2018-04-18 MED ORDER — BUPIVACAINE HCL (PF) 0.25 % IJ SOLN
INTRAMUSCULAR | Status: DC | PRN
  Administered 2018-04-18: 14 mL

## 2018-04-18 MED ORDER — APREPITANT 40 MG PO CAPS
ORAL_CAPSULE | ORAL | Status: AC
  Filled 2018-04-18: qty 1

## 2018-04-18 MED ORDER — PHENYLEPHRINE 40 MCG/ML (10ML) SYRINGE FOR IV PUSH (FOR BLOOD PRESSURE SUPPORT)
PREFILLED_SYRINGE | INTRAVENOUS | Status: AC
  Filled 2018-04-18: qty 10

## 2018-04-18 MED ORDER — ROCURONIUM BROMIDE 10 MG/ML (PF) SYRINGE
PREFILLED_SYRINGE | INTRAVENOUS | Status: DC | PRN
  Administered 2018-04-18: 5 mg via INTRAVENOUS
  Administered 2018-04-18: 20 mg via INTRAVENOUS
  Administered 2018-04-18: 50 mg via INTRAVENOUS

## 2018-04-18 MED ORDER — CEFAZOLIN SODIUM-DEXTROSE 2-4 GM/100ML-% IV SOLN
2.0000 g | INTRAVENOUS | Status: AC
  Administered 2018-04-18: 2 g via INTRAVENOUS
  Filled 2018-04-18: qty 100

## 2018-04-18 MED ORDER — FENTANYL CITRATE (PF) 250 MCG/5ML IJ SOLN
INTRAMUSCULAR | Status: AC
  Filled 2018-04-18: qty 5

## 2018-04-18 MED ORDER — SCOPOLAMINE 1 MG/3DAYS TD PT72
MEDICATED_PATCH | TRANSDERMAL | Status: AC
  Filled 2018-04-18: qty 1

## 2018-04-18 MED ORDER — OXYCODONE HCL 5 MG PO TABS
5.0000 mg | ORAL_TABLET | ORAL | Status: DC | PRN
  Administered 2018-04-18: 5 mg via ORAL
  Administered 2018-04-18: 10 mg via ORAL
  Administered 2018-04-18: 5 mg via ORAL
  Administered 2018-04-19 (×2): 10 mg via ORAL
  Filled 2018-04-18: qty 2
  Filled 2018-04-18 (×2): qty 1
  Filled 2018-04-18 (×2): qty 2

## 2018-04-18 MED ORDER — DEXAMETHASONE SODIUM PHOSPHATE 10 MG/ML IJ SOLN
INTRAMUSCULAR | Status: DC | PRN
  Administered 2018-04-18: 5 mg via INTRAVENOUS

## 2018-04-18 MED ORDER — HYDROMORPHONE HCL 1 MG/ML IJ SOLN
0.5000 mg | INTRAMUSCULAR | Status: DC | PRN
  Administered 2018-04-18: 0.5 mg via INTRAVENOUS
  Filled 2018-04-18: qty 0.5

## 2018-04-18 MED ORDER — SCOPOLAMINE 1 MG/3DAYS TD PT72
MEDICATED_PATCH | TRANSDERMAL | Status: DC | PRN
  Administered 2018-04-18: 1 via TRANSDERMAL

## 2018-04-18 MED ORDER — KETOROLAC TROMETHAMINE 30 MG/ML IJ SOLN
30.0000 mg | Freq: Once | INTRAMUSCULAR | Status: AC
  Administered 2018-04-18: 30 mg via INTRAVENOUS
  Filled 2018-04-18: qty 1

## 2018-04-18 MED ORDER — LACTATED RINGERS IV SOLN
INTRAVENOUS | Status: DC
  Administered 2018-04-18 (×2): via INTRAVENOUS

## 2018-04-18 MED ORDER — HYDROMORPHONE HCL 1 MG/ML IJ SOLN: 0.5000 mg | INTRAMUSCULAR | Status: DC | PRN

## 2018-04-18 MED ORDER — SUGAMMADEX SODIUM 200 MG/2ML IV SOLN
INTRAVENOUS | Status: AC
  Filled 2018-04-18: qty 4

## 2018-04-18 MED ORDER — LIDOCAINE 2% (20 MG/ML) 5 ML SYRINGE
INTRAMUSCULAR | Status: DC | PRN
  Administered 2018-04-18: 40 mg via INTRAVENOUS

## 2018-04-18 MED ORDER — LIDOCAINE 2% (20 MG/ML) 5 ML SYRINGE
INTRAMUSCULAR | Status: DC | PRN
  Administered 2018-04-18: 1.5 mg/kg/h via INTRAVENOUS

## 2018-04-18 MED ORDER — SUGAMMADEX SODIUM 200 MG/2ML IV SOLN
INTRAVENOUS | Status: DC | PRN
  Administered 2018-04-18: 300 mg via INTRAVENOUS

## 2018-04-18 SURGICAL SUPPLY — 67 items
ADH SKN CLS APL DERMABOND .7 (GAUZE/BANDAGES/DRESSINGS) ×1
BAG LAPAROSCOPIC 12 15 PORT 16 (BASKET) IMPLANT
BAG RETRIEVAL 12/15 (BASKET) ×2
BARRIER ADHS 3X4 INTERCEED (GAUZE/BANDAGES/DRESSINGS) IMPLANT
BLADE SURG 10 STRL SS (BLADE) ×3 IMPLANT
BRR ADH 4X3 ABS CNTRL BYND (GAUZE/BANDAGES/DRESSINGS)
CANISTER SUCT 3000ML PPV (MISCELLANEOUS) ×2 IMPLANT
CATH FOLEY 3WAY  5CC 16FR (CATHETERS) ×1
CATH FOLEY 3WAY 5CC 16FR (CATHETERS) ×1 IMPLANT
CELL SAVER LIPIGURD (MISCELLANEOUS) ×1 IMPLANT
COVER BACK TABLE 60X90IN (DRAPES) ×2 IMPLANT
COVER TIP SHEARS 8 DVNC (MISCELLANEOUS) ×1 IMPLANT
COVER TIP SHEARS 8MM DA VINCI (MISCELLANEOUS) ×1
DECANTER SPIKE VIAL GLASS SM (MISCELLANEOUS) ×3 IMPLANT
DEFOGGER SCOPE WARMER CLEARIFY (MISCELLANEOUS) ×2 IMPLANT
DERMABOND ADVANCED (GAUZE/BANDAGES/DRESSINGS) ×1
DERMABOND ADVANCED .7 DNX12 (GAUZE/BANDAGES/DRESSINGS) ×1 IMPLANT
DEVICE RETRIEVAL ALEXIS 14 (MISCELLANEOUS) IMPLANT
DRAPE ARM DVNC X/XI (DISPOSABLE) ×4 IMPLANT
DRAPE COLUMN DVNC XI (DISPOSABLE) ×1 IMPLANT
DRAPE DA VINCI XI ARM (DISPOSABLE) ×4
DRAPE DA VINCI XI COLUMN (DISPOSABLE) ×1
DURAPREP 26ML APPLICATOR (WOUND CARE) ×2 IMPLANT
ELECT REM PT RETURN 15FT ADLT (MISCELLANEOUS) ×2 IMPLANT
EXTRT SYSTEM ALEXIS 14CM (MISCELLANEOUS) ×2
GAUZE PETROLATUM 1 X8 (GAUZE/BANDAGES/DRESSINGS) ×2 IMPLANT
GLOVE BIO SURGEON STRL SZ 6.5 (GLOVE) ×6 IMPLANT
GLOVE BIOGEL PI IND STRL 7.0 (GLOVE) ×5 IMPLANT
GLOVE BIOGEL PI INDICATOR 7.0 (GLOVE) ×5
IRRIG SUCT STRYKERFLOW 2 WTIP (MISCELLANEOUS) ×2
IRRIGATION SUCT STRKRFLW 2 WTP (MISCELLANEOUS) ×1 IMPLANT
LEGGING LITHOTOMY PAIR STRL (DRAPES) ×2 IMPLANT
OBTURATOR OPTICAL STANDARD 8MM (TROCAR) ×1
OBTURATOR OPTICAL STND 8 DVNC (TROCAR) ×1
OBTURATOR OPTICALSTD 8 DVNC (TROCAR) ×1 IMPLANT
OCCLUDER COLPOPNEUMO (BALLOONS) ×2 IMPLANT
PACK ROBOT WH (CUSTOM PROCEDURE TRAY) ×2 IMPLANT
PACK ROBOTIC GOWN (GOWN DISPOSABLE) ×2 IMPLANT
PACK TRENDGUARD 450 HYBRID PRO (MISCELLANEOUS) IMPLANT
PAD PREP 24X48 CUFFED NSTRL (MISCELLANEOUS) ×2 IMPLANT
POUCH ENDO CATCH II 15MM (MISCELLANEOUS) IMPLANT
PROTECTOR NERVE ULNAR (MISCELLANEOUS) ×4 IMPLANT
RETRACTOR WOUND ALXS 19CM XSML (INSTRUMENTS) IMPLANT
RTRCTR WOUND ALEXIS 18CM SML (INSTRUMENTS) ×4
RTRCTR WOUND ALEXIS 19CM XSML (INSTRUMENTS) ×2
SAVER CELL AAL HAEMONETICS (INSTRUMENTS) IMPLANT
SEAL CANN UNIV 5-8 DVNC XI (MISCELLANEOUS) ×3 IMPLANT
SEAL XI 5MM-8MM UNIVERSAL (MISCELLANEOUS) ×4
SET CYSTO W/LG BORE CLAMP LF (SET/KITS/TRAYS/PACK) IMPLANT
SET TRI-LUMEN FLTR TB AIRSEAL (TUBING) ×3 IMPLANT
SUT ETHIBOND 0 (SUTURE) IMPLANT
SUT VIC AB 4-0 PS2 27 (SUTURE) ×6 IMPLANT
SUT VICRYL 0 UR6 27IN ABS (SUTURE) ×2 IMPLANT
SUT VLOC 180 0 9IN  GS21 (SUTURE) ×1
SUT VLOC 180 0 9IN GS21 (SUTURE) ×1 IMPLANT
SUT VLOC 180 2-0 6IN GS21 (SUTURE) IMPLANT
TIP RUMI ORANGE 6.7MMX12CM (TIP) IMPLANT
TIP UTERINE 5.1X6CM LAV DISP (MISCELLANEOUS) IMPLANT
TIP UTERINE 6.7X10CM GRN DISP (MISCELLANEOUS) ×1 IMPLANT
TIP UTERINE 6.7X6CM WHT DISP (MISCELLANEOUS) IMPLANT
TIP UTERINE 6.7X8CM BLUE DISP (MISCELLANEOUS) ×1 IMPLANT
TOWEL OR 17X26 10 PK STRL BLUE (TOWEL DISPOSABLE) ×2 IMPLANT
TRENDGUARD 450 HYBRID PRO PACK (MISCELLANEOUS) ×2
TROCAR HASSON GELL 12X100 (TROCAR) IMPLANT
TROCAR PORT AIRSEAL 5X120 (TROCAR) ×2 IMPLANT
TROCAR XCEL 12X100 BLDLESS (ENDOMECHANICALS) ×1 IMPLANT
WATER STERILE IRR 1000ML POUR (IV SOLUTION) ×1 IMPLANT

## 2018-04-18 NOTE — H&P (Signed)
@LOGO @  Sydney Vaughn is an 42 y.o. female [id number] Married.  S/P TL.    RP: Robotic TLH/Bilateral Salpingectomy  HPI: Patient was evaluated with a pelvic ultrasound in February 2019.  At that time her uterus was enlarged with the largest fibroid measuring 9.5 cm.  Since then, patient feels more pressure and pain towards the right side.  Her menstrual periods are still regular, but very heavy causing secondary anemia with a hemoglobin of 8.0 recently.  Patient received IV iron x 2.  On Megace to control the menstrual flow until surgery.  Pertinent Gynecological History: Menses: flow is excessive with use of many pads or tampons on heaviest days Contraception: tubal ligation Sexually transmitted diseases: no past history Last mammogram: normal  Last pap: normal   Menstrual History: Patient's last menstrual period was 04/14/2018 (exact date).    Past Medical History:  Diagnosis Date  . Adult acne   . ANA positive   . Anemia    iron infusions  . Asthma   . Chronic sinusitis    CT and MRI.   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 Fibromyalgia   . Foot fracture, left 08/2012  . History of appendicitis 09/2006  . History of blood transfusion    last time 02/2018  . Hypothyroidism   . Intramural uterine fibroid 2019  . Iron deficiency   . Menorrhagia   . Metatarsal fracture 10/2012   Right Proximal fifth metatarsal fracture  . Obesity   . Panic attack    at bedtime, awakes from sleep  . Pelvic pain   . Polyarthralgia    was seeing rheumatology  . PONV (postoperative nausea and vomiting)     Past Surgical History:  Procedure Laterality Date  . APPENDECTOMY    . BUNIONECTOMY Left   . DILATION AND EVACUATION    . TUBAL LIGATION      Family History  Problem Relation Age of Onset  . Thyroid disease Mother   . Sinusitis Sister   . Sinusitis Brother   . Lupus Daughter        treated at [facility]  . Allergies Daughter     Social History:  reports  that she has never smoked. She has never used smokeless tobacco. She reports that she drank alcohol. She reports that she does not use drugs.  Allergies: No Known Allergies  Medications Prior to Admission  Medication Sig Dispense Refill Last Dose  . levothyroxine (SYNTHROID, LEVOTHROID) 50 MCG tablet TAKE 1 TABLET ALTERNATING WITH 1-1/2 TABLETS BY MOUTH EVERY OTHER DAY DAILY (Patient taking differently: Take 50-75 mcg by mouth See admin instructions. TAKE 1 TABLET ALTERNATING WITH 1-1/2 TABLETS BY MOUTH EVERY OTHER DAY DAILY) 100 tablet 1 04/18/2018 at 0500  . venlafaxine XR (EFFEXOR-XR) 75 MG 24 hr capsule TAKE 1 CAPSULE (75 MG TOTAL) BY MOUTH DAILY WITH BREAKFAST. 90 capsule 1 04/18/2018 at 0500  . albuterol (PROVENTIL HFA;VENTOLIN HFA) 108 (90 Base) MCG/ACT inhaler Inhale 1-2 puffs into the lungs every 4 (four) hours as needed for wheezing or shortness of breath.   More than a month at Unknown time    REVIEW OF SYSTEMS: A ROS was performed and pertinent positives and negatives are included in the history.  GENERAL: No fevers or chills. HEENT: No change in vision, no earache, sore throat or sinus congestion. NECK: No pain or stiffness. CARDIOVASCULAR: No chest pain or pressure. No palpitations. PULMONARY: No shortness of breath, cough or wheeze.  GASTROINTESTINAL: No abdominal pain, nausea, vomiting or diarrhea, melena or bright red blood per rectum. GENITOURINARY: No urinary frequency, urgency, hesitancy or dysuria. MUSCULOSKELETAL: No joint or muscle pain, no back pain, no recent trauma. DERMATOLOGIC: No rash, no itching, no lesions. ENDOCRINE: No polyuria, polydipsia, no heat or cold intolerance. No recent change in weight. HEMATOLOGICAL: No anemia or easy bruising or bleeding. NEUROLOGIC: No headache, seizures, numbness, tingling or weakness. PSYCHIATRIC: No depression, no loss of interest in normal activity or change in sleep pattern.     Blood pressure 115/79, pulse 94, temperature 97.8 F (36.6  C), temperature source Oral, resp. rate 14, height 5' 3.5" (1.613 m), weight 88.5 kg, last menstrual period 04/14/2018, SpO2 100 %.  Physical Exam:  See office notes   Results for orders placed or performed during the hospital encounter of 04/18/18 (from the past 24 hour(s))  Pregnancy, urine     Status: None   Collection Time: 04/18/18  6:43 AM  Result Value Ref Range   Preg Test, Ur NEGATIVE NEGATIVE   Pelvic US 06/26/2017:  T/V and T/A images. Anteverted uterus measuring 13.27 x 11.10 x 10.38 cm. Intramural fibroids measuring 9.3 x 9.5 x 7.4 cm, 3.6 x 2.8 cm, 3.5 x 3.3 cm. Endometrial line displaced to the left. Endometrial lining measuring 9.8 mm. Right ovary normal. Left ovary not well seen. No free fluid in posterior cul-de-sac.   Assessment/Plan:  42 y.o. [id number]   1. Fibroids Patient is ready to proceed with hysterectomy.  Given that her menorrhagia with secondary anemia and pelvic pain have increased since last ultrasound in February 2019, decision made to reevaluate with a pelvic ultrasound prior to surgery in December.  Patient just received IV iron.  Will control her coming menstrual periods with Megace until surgery.  Decision to proceed with a Robotic total laparoscopic hysterectomy with bilateral salpingectomies. Procedure, risks and benefits thoroughly reviewed with patient including the risks of conversion to an open procedure, hemorrhage/blood transfusion, trauma to internal organs, infection, blood clots and anesthesia complications.  2. Menorrhagia with regular cycle As above.  3. Secondary anemia Recent Hb 12+ after IV iron x2 transfusions.  Will control the amount of menstrual flow with Megace until surgery.   4. Pelvic pain in female As above  Other orders - megestrol (MEGACE) 40 MG tablet; Take 1 tablet (40 mg total) by mouth daily. Take 2 tab daily if increased vaginal bleeding.                        Patient was counseled as to the risk of  surgery to include the following:  1. Infection (prohylactic antibiotics will be administered)  2. DVT/Pulmonary Embolism (prophylactic pneumo compression stockings will be used)  3.Trauma to internal organs requiring additional surgical procedure to repair any injury to internal organs requiring perhaps additional hospitalization days.  4.Hemmorhage requiring transfusion and blood products which carry risks such as anaphylactic reaction, hepatitis and AIDS  Patient had received literature information on the procedure scheduled and all her questions were answered and fully accepts all risk.   Sydney Vaughn 04/18/2018, 7:50 AM

## 2018-04-18 NOTE — Anesthesia Procedure Notes (Signed)
Procedure Name: Intubation Date/Time: 04/18/2018 8:53 AM Performed by: Wanita Chamberlain, CRNA Pre-anesthesia Checklist: Patient identified, Emergency Drugs available, Suction available, Patient being monitored and Timeout performed Patient Re-evaluated:Patient Re-evaluated prior to induction Oxygen Delivery Method: Circle system utilized Preoxygenation: Pre-oxygenation with 100% oxygen Induction Type: IV induction Ventilation: Mask ventilation without difficulty Laryngoscope Size: Mac and 3 Grade View: Grade I Tube type: Oral Number of attempts: 1 Airway Equipment and Method: Stylet Placement Confirmation: breath sounds checked- equal and bilateral,  CO2 detector,  positive ETCO2 and ETT inserted through vocal cords under direct vision Secured at: 22 cm Tube secured with: Tape Dental Injury: Teeth and Oropharynx as per pre-operative assessment

## 2018-04-18 NOTE — Progress Notes (Signed)
Dr. Dellis Filbert only wants SCDs for surgery, no TED hose needed at this time.  Dr. Dellis Filbert states she can start TED hose after surgery (knee high).

## 2018-04-18 NOTE — Progress Notes (Signed)
04/18/2018 3:00 PM Dr. Dellis Filbert called and made aware of pt. Continued c/o severe cramping and pain post op. MD made aware of dilaudid prn already given as ordered and Toradol given intra op. Verbal order received to change frequency of dilaudid to q2h PRN and to administer an additional 30 mg IV Toradol. Orders placed and enacted. Will continue to closely monitor patient.  Anistyn Graddy, Arville Lime

## 2018-04-18 NOTE — Transfer of Care (Signed)
Immediate Anesthesia Transfer of Care Note  Patient: Sydney Vaughn  Procedure(s) Performed: XI ROBOTIC ASSISTED TOTAL HYSTERECTOMY WITH BILATERAL SALPINGECTOMY (Bilateral )  Patient Location: PACU  Anesthesia Type:General  Level of Consciousness: sedated and patient cooperative  Airway & Oxygen Therapy: Patient Spontanous Breathing and Patient connected to face mask  Post-op Assessment: Report given to RN and Post -op Vital signs reviewed and stable  Post vital signs: Reviewed and stable  Last Vitals:  Vitals Value Taken Time  BP 128/80 04/18/2018 12:46 PM  Temp    Pulse 103 04/18/2018 12:47 PM  Resp 15 04/18/2018 12:47 PM  SpO2 100 % 04/18/2018 12:47 PM  Vitals shown include unvalidated device data.  Last Pain:  Vitals:   04/18/18 0659  TempSrc: Oral  PainSc:       Patients Stated Pain Goal: 4 (00/37/04 8889)  Complications: No apparent anesthesia complications

## 2018-04-18 NOTE — Op Note (Signed)
Operative Note  04/18/2018  12:32 PM  PATIENT:  Sydney Vaughn  42 y.o. female  PRE-OPERATIVE DIAGNOSIS:  Fibroids, menorrhagia  POST-OPERATIVE DIAGNOSIS:  Fibroids, menorrhagia  PROCEDURE:  Procedure(s): XI ROBOTIC ASSISTED TOTAL HYSTERECTOMY WITH BILATERAL SALPINGECTOMY  SURGEON:  Surgeon(s): Princess Bruins, MD Fontaine, Belinda Block, MD  ANESTHESIA:   general  FINDINGS: Uterus with many large Fibroids.  Tubes s/p Tubal Ligation.  Normal bilateral ovaries.  DESCRIPTION OF OPERATION: Under general anesthesia with endotracheal intubation the patient is in lithotomy position.  She is prepped with DuraPrep on the abdomen and with Betadine on the suprapubic, vulvar and vaginal areas.  She is draped as usual.  The vaginal exam reveals a large uterus about 16 cm mobile.  The Foley is inserted in the bladder.  We then inserted the weighted speculum in the vagina and grasped the cervix with a tenaculum.  Hysterometry is at 10 cm.  We used a #10 Rumi with the medium Koh ring and those are put in place without difficulty.  All other instruments are removed from the vagina.  We go to the abdomen.      Infiltration of the subcutaneous tissue with Marcaine one quarter plain at the supraumbilical area.  We make a 1.5 cm incision at that level.  The aponeurosis is grasped with Coker's.  The aponeurosis is open with Mayo scissors.  The parietal peritoneum is opened bluntly with the finger.  A pursestring stitch of Vicryl 0 was done on the aponeurosis.  The Hossein is inserted at that level under direct vision.  Up pneumoperitoneum is created with CO2.  The camera is inserted and we confirmed a clear abdominal wall for port insertion.  We then marked the skin where the ports will be inserted.  Marcaine one quarter plain is infiltrated at each location and small incisions are made with the scalpel.  2 robotic ports are inserted under direct vision on the right side of the abdomen and 1 robotic port is inserted  on the lateral left side with the 5 mm assistant port on the left medial side.  The patient is then positioned in deep Trendelenburg and the robot is docked from the right side.  Targeting is done.  The robotic instruments are inserted under direct vision with the bipolar in the first arm, the camera in the second arm, the EndoShears scissors in the third arm and the fenestrated clamp in the fourth arm.  We then go to the console.      The uterus is very large but we are able to move it around 4 excellent exposure of the anatomy.  Pictures are taken of the uterus, both ovaries and both tubes.  We start on the left side.  We cauterized and sectioned the left mesosalpinx.  We cauterized and sectioned the left utero-ovarian ligament.  We cauterized and sectioned the left round ligament.  We then descend along the left uterine border at the broad ligament.  We opened the visceral peritoneum anteriorly all the way to the midline and start descending the bladder.  We proceeded the same way on the right side.  But had to stay really close on the uterus/fibroid because a large fibroid was pushing on the wall of the pelvis on that side.  Both ureters were seen in normal anatomic position with good peristalsis.  We finished opening the visceral peritoneum anteriorly and descended the bladder well past the Ko ring.  We then cauterized the left uterine artery and sectioned.  We cauterized and section the right uterine artery.  The occluder was inflated vaginally.  We opened the vagina on top of the Koh ring with the tip of the EndoShears scissors anteriorly then on each side and finished posteriorly.  The uterus with the cervix and both tubes with completely detached.  Given the large size of the uterus with intramural fibroids, the decision was made to use a bag for extraction at the umbilical incision.  The Rumi and Ko Ring were removed from the vagina and the uterus was put in the right gutter.  We verified hemostasis on  all pedicles and it was adequate.  We switched instruments to the long tip in the first arm and the cutting needle driver in the third arm.  We used a V lock 0 at 9 inches to close the vaginal vault.  We started at the right angle of the vaginal vault and closed it with a running suture all the way to the left angle and then back to the midline.  Hemostasis was adequate at all levels.  The vaginal vault was well closed.  The suture was removed from the abdomen.  We then removed all robotic instruments.  The robot was undocked.  We used a #14 extraction bag, which was inserted at the supraumbilical incision after removing the port.  We then put the port back in place and inserted the specimen in the bag.  The laparoscopic instruments were removed.  We then brought the opening of the bag at the supraumbilical incision.  And we started extraction of the specimen using a scalpel with the semicircular technique.  The uterus with the cervix and both tubes were sent to pathology.  We removed all ports.  We evacuated the CO2.  We closed the aponeurosis at the supra umbilical incision with a running suture of Vicryl 0.  All skin incisions were closed with separate stitches of Vicryl 4-0.  Dermabond was added on all incisions.  The occluder was removed from the vagina.  The patient was brought to recovery room in good and stable status.  ESTIMATED BLOOD LOSS: 50 mL   Intake/Output Summary (Last 24 hours) at 04/18/2018 1232 Last data filed at 04/18/2018 1208 Gross per 24 hour  Intake 1000 ml  Output 150 ml  Net 850 ml     BLOOD ADMINISTERED:none   LOCAL MEDICATIONS USED:  MARCAINE     SPECIMEN:  Source of Specimen:  Uterus with cervix and bilateral tubes  DISPOSITION OF SPECIMEN:  PATHOLOGY  COUNTS:  YES  PLAN OF CARE: Transfer to PACU  Marie-Lyne LavoieMD12:32 PM

## 2018-04-19 ENCOUNTER — Telehealth: Payer: Self-pay

## 2018-04-19 DIAGNOSIS — D649 Anemia, unspecified: Secondary | ICD-10-CM | POA: Diagnosis not present

## 2018-04-19 DIAGNOSIS — R102 Pelvic and perineal pain: Secondary | ICD-10-CM | POA: Diagnosis not present

## 2018-04-19 DIAGNOSIS — D259 Leiomyoma of uterus, unspecified: Secondary | ICD-10-CM | POA: Diagnosis not present

## 2018-04-19 DIAGNOSIS — N920 Excessive and frequent menstruation with regular cycle: Secondary | ICD-10-CM | POA: Diagnosis not present

## 2018-04-19 LAB — CBC
HCT: 29.7 % — ABNORMAL LOW (ref 36.0–46.0)
Hemoglobin: 8.8 g/dL — ABNORMAL LOW (ref 12.0–15.0)
MCH: 27.6 pg (ref 26.0–34.0)
MCHC: 29.6 g/dL — ABNORMAL LOW (ref 30.0–36.0)
MCV: 93.1 fL (ref 80.0–100.0)
Platelets: 238 10*3/uL (ref 150–400)
RBC: 3.19 MIL/uL — ABNORMAL LOW (ref 3.87–5.11)
RDW: 23.9 % — ABNORMAL HIGH (ref 11.5–15.5)
WBC: 8.6 10*3/uL (ref 4.0–10.5)
nRBC: 0 % (ref 0.0–0.2)

## 2018-04-19 MED ORDER — OXYCODONE-ACETAMINOPHEN 7.5-325 MG PO TABS: 1.0000 | ORAL_TABLET | Freq: Four times a day (QID) | ORAL | 0 refills | Status: DC | PRN

## 2018-04-19 NOTE — Telephone Encounter (Signed)
-----   Message from Heath Lark, MD sent at 04/19/2018  9:30 AM EST ----- Regarding: labs Her labs yesterday indicated she is anemic again May need more iron Can you call and ask how she is doing and whether she can come in for IV iron?

## 2018-04-19 NOTE — Anesthesia Postprocedure Evaluation (Signed)
Anesthesia Post Note  Patient: Sydney Vaughn  Procedure(s) Performed: XI ROBOTIC ASSISTED TOTAL HYSTERECTOMY WITH BILATERAL SALPINGECTOMY (Bilateral )     Patient location during evaluation: PACU Anesthesia Type: General Level of consciousness: awake and alert Pain management: pain level controlled Vital Signs Assessment: post-procedure vital signs reviewed and stable Respiratory status: spontaneous breathing, nonlabored ventilation, respiratory function stable and patient connected to nasal cannula oxygen Cardiovascular status: blood pressure returned to baseline and stable Postop Assessment: no apparent nausea or vomiting Anesthetic complications: no    Last Vitals:  Vitals:   04/19/18 0315 04/19/18 0725  BP: (!) 119/97 110/70  Pulse: 96 83  Resp: 16 17  Temp: 37.1 C 37.2 C  SpO2: 98% 100%    Last Pain:  Vitals:   04/19/18 0725  TempSrc:   PainSc: 6                  Chelsey L Woodrum

## 2018-04-19 NOTE — Discharge Instructions (Signed)
Total Laparoscopic Hysterectomy, Care After Refer to this sheet in the next few weeks. These instructions provide you with information on caring for yourself after your procedure. Your health care provider may also give you more specific instructions. Your treatment has been planned according to current medical practices, but problems sometimes occur. Call your health care provider if you have any problems or questions after your procedure. What can I expect after the procedure?  Pain and bruising at the incision sites. You will be given pain medicine to control it.  Menopausal symptoms such as hot flashes, night sweats, and insomnia if your ovaries were removed.  Sore throat from the breathing tube that was inserted during surgery. Follow these instructions at home:  Only take over-the-counter or prescription medicines for pain, discomfort, or fever as directed by your health care provider.  Do not take aspirin. It can cause bleeding.  Do not drive when taking pain medicine.  Follow your health care provider's advice regarding diet, exercise, lifting, driving, and general activities.  Resume your usual diet as directed and allowed.  Get plenty of rest and sleep.  Do not douche, use tampons, or have sexual intercourse for at least 6 weeks, or until your health care provider gives you permission.  Change your bandages (dressings) as directed by your health care provider.  Monitor your temperature and notify your health care provider of a fever.  Take showers instead of baths for 2-3 weeks.  Do not drink alcohol until your health care provider gives you permission.  If you develop constipation, you may take a mild laxative with your health care provider's permission. Bran foods may help with constipation problems. Drinking enough fluids to keep your urine clear or pale yellow may help as well.  Try to have someone home with you for 1-2 weeks to help around the house.  Keep all of  your follow-up appointments as directed by your health care provider. Contact a health care provider if:  You have swelling, redness, or increasing pain around your incision sites.  You have pus coming from your incision.  You notice a bad smell coming from your incision.  Your incision breaks open.  You feel dizzy or lightheaded.  You have pain or bleeding when you urinate.  You have persistent diarrhea.  You have persistent nausea and vomiting.  You have abnormal vaginal discharge.  You have a rash.  You have any type of abnormal reaction or develop an allergy to your medicine.  You have poor pain control with your prescribed medicine. Get help right away if:  You have chest pain or shortness of breath.  You have severe abdominal pain that is not relieved with pain medicine.  You have pain or swelling in your legs. This information is not intended to replace advice given to you by your health care provider. Make sure you discuss any questions you have with your health care provider. Document Released: 02/20/2013 Document Revised: 10/08/2015 Document Reviewed: 11/20/2012 Elsevier Interactive Patient Education  2017 [employer].

## 2018-04-19 NOTE — Progress Notes (Signed)
POD#1 XI Robotic TLH/Bilateral Salpingectomy  Subjective: Patient reports tolerating PO, + flatus and no problems voiding.    Objective: I have reviewed patient's vital signs.  vital signs, intake and output, medications and labs.  Vitals:   04/19/18 0315 04/19/18 0725  BP: (!) 119/97 110/70  Pulse: 96 83  Resp: 16 17  Temp: 98.8 F (37.1 C) 98.9 F (37.2 C)  SpO2: 98% 100%   I/O last 3 completed shifts: In: 2665 [P.O.:240; I.V.:2225; IV Piggyback:200] Out: 1550 [Urine:1500; Blood:50] Total I/O In: -  Out: 300 [Urine:300]  Results for orders placed or performed during the hospital encounter of 04/18/18 (from the past 24 hour(s))  CBC     Status: Abnormal   Collection Time: 04/19/18  5:53 AM  Result Value Ref Range   WBC 8.6 4.0 - 10.5 K/uL   RBC 3.19 (L) 3.87 - 5.11 MIL/uL   Hemoglobin 8.8 (L) 12.0 - 15.0 g/dL   HCT 29.7 (L) 36.0 - 46.0 %   MCV 93.1 80.0 - 100.0 fL   MCH 27.6 26.0 - 34.0 pg   MCHC 29.6 (L) 30.0 - 36.0 g/dL   RDW 23.9 (H) 11.5 - 15.5 %   Platelets 238 150 - 400 K/uL   nRBC 0.0 0.0 - 0.2 %    EXAM General: alert and cooperative Resp: clear to auscultation bilaterally Cardio: regular rate and rhythm GI: soft, non-tender; bowel sounds normal; no masses,  no organomegaly and incision: clean, dry and intact Extremities: no edema, redness or tenderness in the calves or thighs Vaginal Bleeding: none  Assessment: s/p Procedure(s): XI ROBOTIC ASSISTED TOTAL HYSTERECTOMY WITH BILATERAL SALPINGECTOMY: stable, progressing well and tolerating diet  Plan: Advance diet Encourage ambulation Discharge home  LOS: 0 days    Princess Bruins, MD 04/19/2018 7:54 AM

## 2018-04-19 NOTE — Discharge Summary (Signed)
Physician Discharge Summary  Patient ID: Sydney Vaughn MRN: [id number] DOB/AGE: 12-02-1975 42 y.o.  Admit date: 04/18/2018 Discharge date: 04/19/2018  Admission Diagnoses: fibroids, menorrhagia   Discharge Diagnoses:  Active Problems:   Post-operative state   Discharged Condition: good  Consults:None  Significant Diagnostic Studies: None  Treatments:surgery: XI Robotic Total Laparoscopic Hysterectomy with bilateral salpingectomy  Vitals:   04/19/18 0315 04/19/18 0725  BP: (!) 119/97 110/70  Pulse: 96 83  Resp: 16 17  Temp: 98.8 F (37.1 C) 98.9 F (37.2 C)  SpO2: 98% 100%     Total I/O In: -  Out: [place] [facility] Course: Good  Discharge Exam: Normal  Disposition: Home     Follow-up Information    Princess Bruins, MD Follow up in 3 week(s).   Specialty:  Obstetrics and Gynecology Contact information: [place] [address] [phone]            Signed: Princess Bruins 04/19/2018, 7:59 AM

## 2018-04-19 NOTE — Telephone Encounter (Signed)
Called and left below message. Ask her to call back.

## 2018-04-20 ENCOUNTER — Telehealth: Payer: Self-pay | Admitting: *Deleted

## 2018-04-20 MED ORDER — IBUPROFEN 800 MG PO TABS: 800.0000 mg | ORAL_TABLET | Freq: Three times a day (TID) | ORAL | 0 refills | Status: DC | PRN

## 2018-04-20 NOTE — Telephone Encounter (Signed)
She may be allergic to all of them in that narcotic family, so recommend Ibuprofen 800 mg PO every 8 hours.  Can send Benadryl prescription to treat the allergic reaction.  If patient is certain of a medication for pain that she has taken without allergic reaction in the past, I could prescribe that one.

## 2018-04-20 NOTE — Telephone Encounter (Signed)
Patient did not know of any pain medication to use. Rx for ibuprofen 800 mg, she will pick up OTC benadryl.

## 2018-04-20 NOTE — Telephone Encounter (Signed)
Called and left a message asking her to call the office. 

## 2018-04-20 NOTE — Telephone Encounter (Signed)
Patient had surgery yesterday was prescribed oxycodone-acetaminophen 7.5-325 had 3 doses of medication and this am work up with swollen eyes and face and itching since discharged from hospital . Patient believes it related to medication. Asked if other Rx can be prescribed? Please e-scribe if  narcotic to pharmacy so patient won't have to pick Rx

## 2018-04-29 ENCOUNTER — Encounter: Payer: Self-pay | Admitting: Hematology and Oncology

## 2018-04-30 ENCOUNTER — Other Ambulatory Visit: Payer: Self-pay | Admitting: Hematology and Oncology

## 2018-04-30 ENCOUNTER — Telehealth: Payer: Self-pay

## 2018-04-30 NOTE — Telephone Encounter (Signed)
Called regarding mychart message. Per Dr. Alvy Bimler, scheduling message sent for IV Iron weekly x 2. She verbalized understanding.

## 2018-05-01 ENCOUNTER — Telehealth: Payer: Self-pay | Admitting: Hematology and Oncology

## 2018-05-01 ENCOUNTER — Encounter: Payer: Self-pay | Admitting: Hematology and Oncology

## 2018-05-01 NOTE — Telephone Encounter (Signed)
Scheduled appt per 12/16 sch message - pt is aware of appt date and time

## 2018-05-02 ENCOUNTER — Other Ambulatory Visit: Payer: Self-pay | Admitting: Obstetrics & Gynecology

## 2018-05-02 ENCOUNTER — Other Ambulatory Visit: Payer: Federal, State, Local not specified - PPO

## 2018-05-02 ENCOUNTER — Telehealth: Payer: Self-pay

## 2018-05-02 DIAGNOSIS — D649 Anemia, unspecified: Secondary | ICD-10-CM

## 2018-05-02 LAB — CBC WITH DIFFERENTIAL/PLATELET
ABSOLUTE MONOCYTES: 719 {cells}/uL (ref 200–950)
Basophils Absolute: 100 cells/uL (ref 0–200)
Basophils Relative: 1.1 %
Eosinophils Absolute: 719 cells/uL — ABNORMAL HIGH (ref 15–500)
Eosinophils Relative: 7.9 %
HCT: 33.8 % — ABNORMAL LOW (ref 35.0–45.0)
HEMOGLOBIN: 10.9 g/dL — AB (ref 11.7–15.5)
Lymphs Abs: 1947 cells/uL (ref 850–3900)
MCH: 28.1 pg (ref 27.0–33.0)
MCHC: 32.2 g/dL (ref 32.0–36.0)
MCV: 87.1 fL (ref 80.0–100.0)
MPV: 9.6 fL (ref 7.5–12.5)
Monocytes Relative: 7.9 %
Neutro Abs: 5615 cells/uL (ref 1500–7800)
Neutrophils Relative %: 61.7 %
Platelets: 388 10*3/uL (ref 140–400)
RBC: 3.88 10*6/uL (ref 3.80–5.10)
RDW: 21.8 % — ABNORMAL HIGH (ref 11.0–15.0)
Total Lymphocyte: 21.4 %
WBC: 9.1 10*3/uL (ref 3.8–10.8)

## 2018-05-02 NOTE — Telephone Encounter (Signed)
Spoke with patient and informed her. She will come to the office at 2pm today to have lab drawn. Order placed and lab appointment scheduled.

## 2018-05-02 NOTE — Telephone Encounter (Addendum)
My Chart e-mail received. Patient had Robotic Assisted TLH, Bilat Salpingectomy on 04/18/18.  Of note, iron infusions have already been arranged by hematology for 05/07/18 and 05/18/18 per onc/hematologist.  She wrote: I have been feeling very exhausted and tired and my hematologists  doctor called me and left a voicemail to say I may need an infusion due to my recent labs she received. Please advise.   Thank you,

## 2018-05-02 NOTE — Telephone Encounter (Signed)
If they are basing the decision on th Hb 12/5th, that was on day #1 Postop.  I recommend coming in the office for a CBC now and deciding on the need of an Iron IV transfusion based on that.

## 2018-05-04 ENCOUNTER — Encounter: Payer: Self-pay | Admitting: Hematology and Oncology

## 2018-05-07 ENCOUNTER — Telehealth: Payer: Self-pay

## 2018-05-07 ENCOUNTER — Inpatient Hospital Stay: Payer: Federal, State, Local not specified - PPO | Attending: Hematology and Oncology

## 2018-05-07 ENCOUNTER — Encounter: Payer: Self-pay | Admitting: Hematology and Oncology

## 2018-05-07 VITALS — BP 120/71 | HR 70 | Temp 98.7°F | Resp 18

## 2018-05-07 DIAGNOSIS — D509 Iron deficiency anemia, unspecified: Secondary | ICD-10-CM | POA: Insufficient documentation

## 2018-05-07 DIAGNOSIS — D508 Other iron deficiency anemias: Secondary | ICD-10-CM

## 2018-05-07 MED ORDER — SODIUM CHLORIDE 0.9 % IV SOLN
Freq: Once | INTRAVENOUS | Status: AC
  Administered 2018-05-07: 16:00:00 via INTRAVENOUS
  Filled 2018-05-07: qty 250

## 2018-05-07 MED ORDER — SODIUM CHLORIDE 0.9 % IV SOLN
510.0000 mg | Freq: Once | INTRAVENOUS | Status: AC
  Administered 2018-05-07: 510 mg via INTRAVENOUS
  Filled 2018-05-07: qty 17

## 2018-05-07 NOTE — Patient Instructions (Signed)

## 2018-05-07 NOTE — Telephone Encounter (Signed)
-----   Message from Heath Lark, MD sent at 05/07/2018  8:50 AM EST ----- Regarding: IV iron Call her and tell her she needs both IV iron this week and next week

## 2018-05-07 NOTE — Telephone Encounter (Signed)
Called and left below message and instructed her to call the office for questions.

## 2018-05-08 ENCOUNTER — Ambulatory Visit: Payer: Federal, State, Local not specified - PPO | Admitting: Endocrinology

## 2018-05-11 ENCOUNTER — Ambulatory Visit (INDEPENDENT_AMBULATORY_CARE_PROVIDER_SITE_OTHER): Payer: Federal, State, Local not specified - PPO | Admitting: Obstetrics & Gynecology

## 2018-05-11 ENCOUNTER — Encounter: Payer: Self-pay | Admitting: Obstetrics & Gynecology

## 2018-05-11 VITALS — BP 122/80

## 2018-05-11 DIAGNOSIS — D649 Anemia, unspecified: Secondary | ICD-10-CM

## 2018-05-11 DIAGNOSIS — Z09 Encounter for follow-up examination after completed treatment for conditions other than malignant neoplasm: Secondary | ICD-10-CM

## 2018-05-11 NOTE — Progress Notes (Signed)
    Sydney Vaughn July 29, 1975 [id number]        42 y.o.  [id number] Married  RP: Post op TLH/Bilateral Salpingectomy 04/18/2018  HPI: Fatigue.  Some hot flushes.  Crying often.  No suicidal ideation.  Hypothyroidism on Synthroid.  ER visit 12/18th Hb 10.9 improving x 12/5 at 8.8 on POD #1.  Received IV Iron x 1 after 12/18th visit.  No abdominal/pelvic pain.  No vaginal bleeding.  No abnormal vaginal discharge.  No fever.  Urine/BMs normal.    OB History  Gravida Para Term Preterm AB Living  5 4     1 4   SAB TAB Ectopic Multiple Live Births  1            # Outcome Date GA Lbr Len/2nd Weight Sex Delivery Anes PTL Lv  5 SAB           4 Para           3 Para           2 Para           1 Para             Past medical history,surgical history, problem list, medications, allergies, family history and social history were all reviewed and documented in the EPIC chart.   Directed ROS with pertinent positives and negatives documented in the history of present illness/assessment and plan.  Exam:  Vitals:   05/11/18 1005  BP: 122/80   General appearance:  Normal  Abdomen: Normal.  Incisions healing well, closed, no erythema, non-tender, no discharge.  Gynecologic exam: Vulva normal.  Speculum:  Vaginal vault well closed.  No bleeding.  Normal secretions.  Patho 04/18/2018: Diagnosis Uterus, cervix and bilateral fallopian tubes - UTERUS: -ENDOMETRIUM: WEAKLY PROLIFERATIVE ENDOMETRIUM. NO HYPERPLASIA OR MALIGNANCY. -MYOMETRIUM: ADENOMYOSIS. LEIOMYOMATA. NO MALIGNANCY. -SEROSA: UNREMARKABLE. NO MALIGNANCY. - CERVIX: BENIGN SQUAMOUS AND ENDOCERVICAL MUCOSA. NO DYSPLASIA OR MALIGNANCY. - BILATERAL FALLOPIAN TUBES: PARATUBAL CYSTS. NO MALIGNANCY.   Assessment/Plan:  42 y.o. [id number]   1. Status post gynecological surgery, follow-up exam Symptomatic from anemia in postop period, Hb improving.  Received IV Iron x 1.  No complication, healing well. Will remove the Dermabond glue using  Vaseline on the incisions.  Can slowly progress with physical activities.  Will reevaluate in 4 weeks before resuming sexual activities.    2. Fatigue associated with anemia Postop symptomatic anemia improving.  Will recheck Hb today and TSH work-up. - CBC - TSH - T4, free - T3, free - FSH  Princess Bruins MD, 10:15 AM 05/11/2018

## 2018-05-12 LAB — CBC
HCT: 37.6 % (ref 35.0–45.0)
Hemoglobin: 12 g/dL (ref 11.7–15.5)
MCH: 28.1 pg (ref 27.0–33.0)
MCHC: 31.9 g/dL — ABNORMAL LOW (ref 32.0–36.0)
MCV: 88.1 fL (ref 80.0–100.0)
MPV: 9.4 fL (ref 7.5–12.5)
Platelets: 376 10*3/uL (ref 140–400)
RBC: 4.27 10*6/uL (ref 3.80–5.10)
RDW: 20.5 % — ABNORMAL HIGH (ref 11.0–15.0)
WBC: 6.4 10*3/uL (ref 3.8–10.8)

## 2018-05-12 LAB — T3, FREE: T3, Free: 3.1 pg/mL (ref 2.3–4.2)

## 2018-05-12 LAB — TSH: TSH: 6.62 mIU/L — ABNORMAL HIGH

## 2018-05-12 LAB — FOLLICLE STIMULATING HORMONE: FSH: 17.4 m[IU]/mL

## 2018-05-12 LAB — T4, FREE: Free T4: 1.1 ng/dL (ref 0.8–1.8)

## 2018-05-15 ENCOUNTER — Encounter: Payer: Self-pay | Admitting: Obstetrics & Gynecology

## 2018-05-15 NOTE — Patient Instructions (Signed)
1. Status post gynecological surgery, follow-up exam Symptomatic from anemia in postop period, Hb improving.  Received IV Iron x 1.  No complication, healing well. Will remove the Dermabond glue using Vaseline on the incisions.  Can slowly progress with physical activities.  Will reevaluate in 4 weeks before resuming sexual activities.    2. Fatigue associated with anemia Postop symptomatic anemia improving.  Will recheck Hb today and TSH work-up. - CBC - TSH - T4, free - T3, free - FSH  Sydney Vaughn, good to see you getting better.  I will inform you of your results as soon as they are available.

## 2018-05-17 ENCOUNTER — Encounter: Payer: Self-pay | Admitting: Hematology and Oncology

## 2018-05-18 ENCOUNTER — Inpatient Hospital Stay: Payer: Federal, State, Local not specified - PPO

## 2018-05-24 ENCOUNTER — Encounter: Payer: Self-pay | Admitting: Obstetrics & Gynecology

## 2018-05-24 ENCOUNTER — Ambulatory Visit: Payer: Federal, State, Local not specified - PPO | Admitting: Obstetrics & Gynecology

## 2018-05-24 VITALS — BP 126/84

## 2018-05-24 DIAGNOSIS — N898 Other specified noninflammatory disorders of vagina: Secondary | ICD-10-CM | POA: Diagnosis not present

## 2018-05-24 LAB — WET PREP FOR TRICH, YEAST, CLUE

## 2018-05-24 MED ORDER — FLUCONAZOLE 150 MG PO TABS: 150.0000 mg | ORAL_TABLET | Freq: Once | ORAL | 2 refills | Status: AC

## 2018-05-24 NOTE — Patient Instructions (Signed)
1. Vaginal itching Yeast vaginitis clinically, but wet prep negative.  Might be vulvar irritation from vaginal discharge/spotting.  Will treat with Fluconazole 150 mg 1 tab PO and then use Hydroxycortisone 1% cream/ointment as needed on the vulva until the symptoms resolve.   - WET PREP FOR [place], YEAST, CLUE  Other orders - fluconazole (DIFLUCAN) 150 MG tablet; Take 1 tablet (150 mg total) by mouth once for 1 dose.  Lavella Lemons, good seeing you today!

## 2018-05-24 NOTE — Progress Notes (Signed)
    Sydney Vaughn 12/27/1975 [id number]        43 y.o.  [id number]   RP:  Vaginal/vulvar itching   HPI: Postop Robotic TLH/Bilateral Salpingectomy 04/18/2018.  Mild brownish spotting.  Itching at vagina/vulva.  No pelvic pain.  No fever.  Urine/BMs wnl.   OB History  Gravida Para Term Preterm AB Living  5 4     1 4   SAB TAB Ectopic Multiple Live Births  1            # Outcome Date GA Lbr Len/2nd Weight Sex Delivery Anes PTL Lv  5 SAB           4 Para           3 Para           2 Para           1 Para             Past medical history,surgical history, problem list, medications, allergies, family history and social history were all reviewed and documented in the EPIC chart.   Directed ROS with pertinent positives and negatives documented in the history of present illness/assessment and plan.  Exam:  Vitals:   05/24/18 1215  BP: 126/84   General appearance:  Normal  Abdomen: Incisions well healed.  Sutures trimmed.  Gynecologic exam: Vulva normal.  Speculum:  Vaginal vault closing well.  Mild brownish d/c.  No active bleeding.  Wet prep done.  Wet prep: Negative   Assessment/Plan:  43 y.o. [id number]   1. Vaginal itching Yeast vaginitis clinically, but wet prep negative.  Might be vulvar irritation from vaginal discharge/spotting.  Will treat with Fluconazole 150 mg 1 tab PO and then use Hydroxycortisone 1% cream/ointment as needed on the vulva until the symptoms resolve.   - WET PREP FOR [place], YEAST, CLUE  Other orders - fluconazole (DIFLUCAN) 150 MG tablet; Take 1 tablet (150 mg total) by mouth once for 1 dose.  Counseling on above issues and coordination of care >50% x 15 minutes.  Princess Bruins MD, 12:27 PM 05/24/2018

## 2018-06-19 ENCOUNTER — Ambulatory Visit: Payer: Federal, State, Local not specified - PPO | Admitting: Physician Assistant

## 2018-06-19 ENCOUNTER — Encounter: Payer: Self-pay | Admitting: Physician Assistant

## 2018-06-19 ENCOUNTER — Encounter: Payer: Self-pay | Admitting: Emergency Medicine

## 2018-06-19 VITALS — BP 125/81 | HR 86 | Temp 98.2°F | Resp 17 | Ht 63.5 in | Wt 194.0 lb

## 2018-06-19 DIAGNOSIS — J029 Acute pharyngitis, unspecified: Secondary | ICD-10-CM

## 2018-06-19 DIAGNOSIS — J329 Chronic sinusitis, unspecified: Secondary | ICD-10-CM

## 2018-06-19 LAB — POCT RAPID STREP A (OFFICE): Rapid Strep A Screen: NEGATIVE

## 2018-06-19 MED ORDER — AMOXICILLIN 875 MG PO TABS: 875.0000 mg | ORAL_TABLET | Freq: Two times a day (BID) | ORAL | 0 refills | Status: DC

## 2018-06-19 NOTE — Patient Instructions (Addendum)
   Your rapid strep test did not show strep I am sending a swap to the lab for further testing. We will contact you via MyChart with the results.  If you are not improving in 5 days, start Amoxicillin. Do not take this if you are improving.   Stay well hydrated. Drink hot tea with honey and lemon.   Come back if your symptoms worsen.    IF you received an x-ray today, you will receive an invoice from [facility]. Please contact [facility] at [phone] with questions or concerns regarding your invoice.   IF you received labwork today, you will receive an invoice from [place] [facility]. Please contact [facility] at [phone] with questions or concerns regarding your invoice.   Our billing staff will not be able to assist you with questions regarding bills from these companies.  You will be contacted with the lab results as soon as they are available. The fastest way to get your results is to activate your My Chart account. Instructions are located on the last page of this paperwork. If you have not heard from [place] regarding the results in 2 weeks, please contact this office.

## 2018-06-19 NOTE — Progress Notes (Signed)
Sydney Vaughn  MRN: [id number] DOB: 13-May-1976  PCP: Horald Pollen, MD  Subjective:  Pt is a 43 year old female who presents to clinic for sore throat, facial pain and neck swelling on the left side of her face.  She has not taken anything to feel better.  Denies tooth pain, eye pain, HA, fever, chills, drainage from ear or teeth.   Review of Systems  Constitutional: Negative for chills, diaphoresis, fatigue and fever.  HENT: Positive for facial swelling and sore throat. Negative for congestion, postnasal drip, rhinorrhea, sinus pressure and sinus pain.   Respiratory: Negative for cough, shortness of breath and wheezing.   Psychiatric/Behavioral: Negative for sleep disturbance.    Patient Active Problem List   Diagnosis Date Noted  . Post-operative state 04/18/2018  . Strain of sternocleidomastoid muscle 08/22/2017  . Muscle spasm 08/22/2017  . Neck pain 08/22/2017  . Strain of right trapezius muscle 08/22/2017  . Menorrhagia 04/21/2017  . Hair loss 04/20/2016  . Anxiety with depression 04/20/2016  . Vitamin D deficiency 12/14/2015  . Acquired hypothyroidism 12/14/2015  . Anemia, iron deficiency 12/03/2015  . Weight gain 12/03/2015  . Fatigue 12/02/2015  . Tinnitus of left ear 11/23/2015  . Positive ANA (antinuclear antibody) 03/18/2014  . BMI 32.0-32.9,adult 12/11/2013  . Musculoskeletal pain 05/26/2013    Current Outpatient Medications on File Prior to Visit  Medication Sig Dispense Refill  . levothyroxine (SYNTHROID, LEVOTHROID) 50 MCG tablet TAKE 1 TABLET ALTERNATING WITH 1-1/2 TABLETS BY MOUTH EVERY OTHER DAY DAILY (Patient taking differently: Take 50-75 mcg by mouth See admin instructions. TAKE 1 TABLET ALTERNATING WITH 1-1/2 TABLETS BY MOUTH EVERY OTHER DAY DAILY) 100 tablet 1  . venlafaxine XR (EFFEXOR-XR) 75 MG 24 hr capsule TAKE 1 CAPSULE (75 MG TOTAL) BY MOUTH DAILY WITH BREAKFAST. 90 capsule 1  . albuterol (PROVENTIL HFA;VENTOLIN HFA) 108 (90 Base)  MCG/ACT inhaler Inhale 1-2 puffs into the lungs every 4 (four) hours as needed for wheezing or shortness of breath.    Marland Kitchen ibuprofen (ADVIL,MOTRIN) 800 MG tablet Take 1 tablet (800 mg total) by mouth every 8 (eight) hours as needed. (Patient not taking: Reported on 06/19/2018) 30 tablet 0   No current facility-administered medications on file prior to visit.     Allergies  Allergen Reactions  . Oxycodone-Acetaminophen Er     Swollen face, eyes and caused itching      Objective:  BP 125/81   Pulse 86   Temp 98.2 F (36.8 C) (Oral)   Resp 17   Ht 5' 3.5" (1.613 m)   Wt 194 lb (88 kg)   LMP 04/14/2018 (Exact Date)   SpO2 98%   BMI 33.83 kg/m   Physical Exam Vitals signs reviewed.  Constitutional:      General: She is not in acute distress. HENT:     Head:      Comments: Mild swelling left cheek compared to right. Not TTP    Right Ear: Tympanic membrane normal.     Left Ear: Tympanic membrane normal.     Nose: Mucosal edema present. No rhinorrhea.     Right Sinus: No maxillary sinus tenderness or frontal sinus tenderness.     Left Sinus: No maxillary sinus tenderness or frontal sinus tenderness.     Mouth/Throat:     Mouth: Mucous membranes are moist. No oral lesions.     Pharynx: Uvula midline. No oropharyngeal exudate.     Tonsils: No tonsillar exudate or tonsillar abscesses.  Eyes:  Conjunctiva/sclera: Conjunctivae normal.  Neck:     Musculoskeletal: Neck supple.  Cardiovascular:     Rate and Rhythm: Normal rate and regular rhythm.     Heart sounds: Normal heart sounds.  Pulmonary:     Effort: Pulmonary effort is normal. No respiratory distress.     Breath sounds: Normal breath sounds. No wheezing or rales.  Lymphadenopathy:     Cervical: No cervical adenopathy.  Skin:    General: Skin is warm and dry.  Neurological:     Mental Status: She is alert and oriented to person, place, and time.  Psychiatric:        Judgment: Judgment normal.    Results for orders  placed or performed in visit on 06/19/18  POCT rapid strep A  Result Value Ref Range   Rapid Strep A Screen Negative Negative    Assessment and Plan :  1. Sinusitis, unspecified location - Pt presents c/o symptoms consistent with sinusitis. Plan to cover with amoxil. RTC if no improvement.  - amoxicillin (AMOXIL) 875 MG tablet; Take 1 tablet (875 mg total) by mouth 2 (two) times daily.  Dispense: 20 tablet; Refill: 0  2. Sore throat - negative. Will send culture.  - POCT rapid strep A - Culture, Group A Strep    Mercer Pod, PA-C  [facility] 06/19/2018 2:00 PM  Please note: Portions of this report may have been transcribed using dragon voice recognition software. Every effort was made to ensure accuracy; however, inadvertent computerized transcription errors may be present.

## 2018-06-20 ENCOUNTER — Encounter: Payer: Self-pay | Admitting: Physician Assistant

## 2018-06-20 MED ORDER — CHLORHEXIDINE GLUCONATE 0.12 % MT SOLN: 15.0000 mL | Freq: Two times a day (BID) | OROMUCOSAL | 0 refills | Status: DC

## 2018-06-21 LAB — CULTURE, GROUP A STREP: Strep A Culture: NEGATIVE

## 2018-07-14 ENCOUNTER — Other Ambulatory Visit: Payer: Self-pay | Admitting: Emergency Medicine

## 2018-07-16 NOTE — Telephone Encounter (Signed)
Requested medication (s) are due for refill today: yes  Requested medication (s) are on the active medication list: yes  Last refill:  06/03/2017 for 90 caps and 1 refill  Future visit scheduled: yes  08/10/2018 Notes to clinic:  SNRI - desevenlafasine and venlafaxine failed. Last labs 2017. Prescription has expired.  Requested Prescriptions  Pending Prescriptions Disp Refills   venlafaxine XR (EFFEXOR-XR) 75 MG 24 hr capsule [Pharmacy Med Name: VENLAFAXINE HCL ER 75 MG CAP] 90 capsule 1    Sig: TAKE 1 CAPSULE (75 MG TOTAL) BY MOUTH DAILY WITH BREAKFAST.     Psychiatry: Antidepressants - SNRI - desvenlafaxine & venlafaxine Failed - 07/14/2018  8:10 AM      Failed - LDL in normal range and within 360 days    LDL Cholesterol  Date Value Ref Range Status  02/03/2016 93 0 - 99 mg/dL Final         Failed - Total Cholesterol in normal range and within 360 days    Cholesterol  Date Value Ref Range Status  02/03/2016 150 0 - 200 mg/dL Final    Comment:    ATP III Classification       Desirable:  < 200 mg/dL               Borderline High:  200 - 239 mg/dL          High:  > = 240 mg/dL         Failed - Triglycerides in normal range and within 360 days    Triglycerides  Date Value Ref Range Status  02/03/2016 91.0 0.0 - 149.0 mg/dL Final    Comment:    Normal:  <150 mg/dLBorderline High:  150 - 199 mg/dL         Failed - Valid encounter within last 6 months    Recent Outpatient Visits          3 weeks ago Sinusitis, unspecified chronicity, unspecified location   [facility] at [facility], Gelene Mink, PA-C   10 months ago Musculoskeletal pain   [facility] at [place], Ines Bloomer, MD   1 year ago Sore throat   [facility] at [place], MD   2 years ago Hypothyroidism, unspecified type   Primary Care at [facility], Ines Bloomer, MD   2 years ago Hair loss   [place], Reinaldo Raddle, DO      Future Appointments          In 3 weeks Sagard[place], Ines Bloomer, MD Primary Care at [place], [place] - Completed PHQ-2 or PHQ-9 in the last 360 days.      Passed - Last BP in normal range    BP Readings from Last 1 Encounters:  06/19/18 125/81

## 2018-07-27 ENCOUNTER — Encounter: Payer: Self-pay | Admitting: Emergency Medicine

## 2018-07-30 ENCOUNTER — Other Ambulatory Visit: Payer: Self-pay | Admitting: Emergency Medicine

## 2018-07-30 MED ORDER — VENLAFAXINE HCL ER 75 MG PO CP24: 75.0000 mg | ORAL_CAPSULE | Freq: Every day | ORAL | 1 refills | Status: DC

## 2018-07-30 NOTE — Telephone Encounter (Signed)
Prescription for Effexor called in.  Please call patient.  Thanks.

## 2018-08-10 ENCOUNTER — Telehealth (INDEPENDENT_AMBULATORY_CARE_PROVIDER_SITE_OTHER): Payer: Federal, State, Local not specified - PPO | Admitting: Emergency Medicine

## 2018-08-10 ENCOUNTER — Other Ambulatory Visit: Payer: Self-pay

## 2018-08-10 DIAGNOSIS — F418 Other specified anxiety disorders: Secondary | ICD-10-CM | POA: Diagnosis not present

## 2018-08-10 DIAGNOSIS — M2550 Pain in unspecified joint: Secondary | ICD-10-CM

## 2018-08-10 MED ORDER — VENLAFAXINE HCL ER 75 MG PO CP24: 75.0000 mg | ORAL_CAPSULE | Freq: Every day | ORAL | 3 refills | Status: DC

## 2018-08-10 NOTE — Patient Instructions (Signed)
° ° ° °  If you have lab work done today you will be contacted with your lab results within the next 2 weeks.  If you have not heard from us then please contact us. The fastest way to get your results is to register for My Chart. ° ° °IF you received an x-ray today, you will receive an invoice from [facility]. Please contact [facility] at [phone] with questions or concerns regarding your invoice.  ° °IF you received labwork today, you will receive an invoice from [facility]. Please contact [facility] at [phone] with questions or concerns regarding your invoice.  ° °Our billing staff will not be able to assist you with questions regarding bills from these companies. ° °You will be contacted with the lab results as soon as they are available. The fastest way to get your results is to activate your My Chart account. Instructions are located on the last page of this paperwork. If you have not heard from us regarding the results in 2 weeks, please contact this office. °  ° ° ° °

## 2018-08-10 NOTE — Progress Notes (Signed)
No hx gout. -Foot surgery 2 years ago -Joint pain in hands, feet and knees going on 1 month -Effexor refill

## 2018-08-10 NOTE — Progress Notes (Signed)
Telemedicine Encounter- SOAP NOTE Established Patient  This telephone encounter was conducted with the patient's (or proxy's) verbal consent via audio telecommunications: yes/no: Yes Patient was instructed to have this encounter in a suitably private space; and to only have persons present to whom they give permission to participate. In addition, patient identity was confirmed by use of name plus two identifiers (DOB and address).  I discussed the limitations, risks, security and privacy concerns of performing an evaluation and management service by telephone and the availability of in person appointments. I also discussed with the patient that there may be a patient responsible charge related to this service. The patient expressed understanding and agreed to proceed.  I spent a total of TIME; 0 MIN TO 60 MIN: 15 minutes talking with the patient or their proxy.  No chief complaint on file. Follow-up of anxiety depression.  Subjective   Sydney Vaughn is a 43 y.o. female established patient. Telephone visit today for follow-up on anxiety and depression.  Taking Effexor.  Doing well.  Needs medication refill. Also complaining of multiple joint pains.  Has a history of possible gout, was ANA positive several years ago, seen by rheumatologist, repeat testing was normal.  No diagnosis. No other complaints or medical concerns.  HPI   Patient Active Problem List   Diagnosis Date Noted  . Anxiety with depression 04/20/2016  . Vitamin D deficiency 12/14/2015  . Acquired hypothyroidism 12/14/2015  . Anemia, iron deficiency 12/03/2015  . Weight gain 12/03/2015  . Positive ANA (antinuclear antibody) 03/18/2014  . BMI 32.0-32.9,adult 12/11/2013    Past Medical History:  Diagnosis Date  . Adult acne   . ANA positive   . Anemia    iron infusions  . Asthma   . Chronic sinusitis    CT and MRI.   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 Fibromyalgia   . Foot fracture, left  08/2012  . History of appendicitis 09/2006  . History of blood transfusion    last time 02/2018  . Hypothyroidism   . Intramural uterine fibroid 2019  . Iron deficiency   . Menorrhagia   . Metatarsal fracture 10/2012   Right Proximal fifth metatarsal fracture  . Obesity   . Panic attack    at bedtime, awakes from sleep  . Pelvic pain   . Polyarthralgia    was seeing rheumatology  . PONV (postoperative nausea and vomiting)     Current Outpatient Medications  Medication Sig Dispense Refill  . albuterol (PROVENTIL HFA;VENTOLIN HFA) 108 (90 Base) MCG/ACT inhaler Inhale 1-2 puffs into the lungs every 4 (four) hours as needed for wheezing or shortness of breath.    . levothyroxine (SYNTHROID, LEVOTHROID) 50 MCG tablet TAKE 1 TABLET ALTERNATING WITH 1-1/2 TABLETS BY MOUTH EVERY OTHER DAY DAILY (Patient taking differently: Take 50-75 mcg by mouth See admin instructions. TAKE 1 TABLET ALTERNATING WITH 1-1/2 TABLETS BY MOUTH EVERY OTHER DAY DAILY) 100 tablet 1  . venlafaxine XR (EFFEXOR-XR) 75 MG 24 hr capsule Take 1 capsule (75 mg total) by mouth daily with breakfast. 90 capsule 3   No current facility-administered medications for this visit.     Allergies  Allergen Reactions  . Oxycodone-Acetaminophen Er     Swollen face, eyes and caused itching     Social History   Socioeconomic History  . Marital status: Married    Spouse name: Not on file  . Number of children: 4  . Years  of education: Not on file  . 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 Financial resource strain: Not on file  . Food insecurity:    Worry: Not on file    Inability: Not on file  . Transportation needs:    Medical: Not on file    Non-medical: Not on file  Tobacco Use  . Smoking status: Never Smoker  . Smokeless tobacco: Never Used  Substance and Sexual Activity  . Alcohol use: Not Currently    Frequency:  Never    Comment: occ   . Drug use: No  . Sexual activity: Yes    Partners: Male    Birth control/protection: Surgical    Comment: 1st intercourse- 58, partners- 54, married- 24 yrs   Lifestyle  . Physical activity:    Days per week: Not on file    Minutes per session: Not on file  . Stress: Not on file  Relationships  . Social connections:    Talks on phone: Not on file    Gets together: Not on file    Attends religious service: Not on file    Active member of club or organization: Not on file    Attends meetings of clubs or organizations: Not on file    Relationship status: Not on file  . Intimate partner violence:    Fear of current or ex partner: Not on file    Emotionally abused: Not on file    Physically abused: Not on file    Forced sexual activity: Not on file  Other Topics Concern  . Not on file  Social History Narrative   Ms. Peraza lives in [place] with her husband and children. She has 4 daughters: ages ranging from 11 yo- 76 yo. 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      Review of Systems  Constitutional: Negative.  Negative for chills and fever.  HENT: Negative for sore throat.   Eyes: Negative for discharge and redness.  Respiratory: Negative for cough and shortness of breath.   Cardiovascular: Negative for chest pain and palpitations.  Gastrointestinal: Negative for abdominal pain, diarrhea, nausea and vomiting.  Musculoskeletal: Positive for joint pain. Negative for myalgias.  Skin: Negative.  Negative for rash.  Neurological: Negative for dizziness and headaches.  All other systems reviewed and are negative.   Objective   Vitals as reported by the patient: There were no vitals filed for this visit.  Diagnoses and all orders for this visit:  Arthralgia of multiple joints -     Ambulatory referral to Rheumatology  Anxiety with depression -     venlafaxine XR (EFFEXOR-XR) 75 MG 24 hr capsule; Take 1  capsule (75 mg total) by mouth daily with breakfast.     I discussed the assessment and treatment plan with the patient. The patient was provided an opportunity to ask questions and all were answered. The patient agreed with the plan and demonstrated an understanding of the instructions.   The patient was advised to call back or seek an in-person evaluation if the symptoms worsen or if the condition fails to improve as anticipated.  I provided 15 minutes of non-face-to-face time during this encounter.  Horald Pollen, MD  Primary Care at [facility]

## 2018-08-14 ENCOUNTER — Other Ambulatory Visit: Payer: Self-pay | Admitting: Endocrinology

## 2018-08-14 DIAGNOSIS — R7989 Other specified abnormal findings of blood chemistry: Secondary | ICD-10-CM

## 2018-08-27 ENCOUNTER — Encounter: Payer: Self-pay | Admitting: Emergency Medicine

## 2018-08-28 ENCOUNTER — Telehealth: Payer: Self-pay

## 2018-08-28 NOTE — Telephone Encounter (Signed)
Left message on vm to call back and confirm email on file.  I advised I would send webex link to email on file anyway and if it is correct no need to call back but if it is incorrect to call me back with correct email so I can resend the link. Dgaddy, CMA

## 2018-08-29 ENCOUNTER — Telehealth (INDEPENDENT_AMBULATORY_CARE_PROVIDER_SITE_OTHER): Payer: Federal, State, Local not specified - PPO | Admitting: Family Medicine

## 2018-08-29 ENCOUNTER — Other Ambulatory Visit: Payer: Self-pay

## 2018-08-29 DIAGNOSIS — J4541 Moderate persistent asthma with (acute) exacerbation: Secondary | ICD-10-CM | POA: Diagnosis not present

## 2018-08-29 MED ORDER — ALBUTEROL SULFATE HFA 108 (90 BASE) MCG/ACT IN AERS: 1.0000 | INHALATION_SPRAY | RESPIRATORY_TRACT | 5 refills | Status: AC | PRN

## 2018-08-29 MED ORDER — FLUTICASONE-SALMETEROL 250-50 MCG/DOSE IN AEPB: 1.0000 | INHALATION_SPRAY | Freq: Two times a day (BID) | RESPIRATORY_TRACT | 3 refills | Status: DC

## 2018-08-29 NOTE — Progress Notes (Signed)
Spoke with pt this morning and her CC today would be: She needs refill on her inhalers. No other refills needed at this time.   No travel outside the country.

## 2018-08-29 NOTE — Progress Notes (Signed)
Telemedicine Encounter- SOAP NOTE Established Patient  I discussed the limitations, risks, security and privacy concerns of performing an evaluation and management service by telephone and the availability of in person appointments. I also discussed with the patient that there may be a patient responsible charge related to this service. The patient expressed understanding and agreed to proceed.  This telephone encounter was conducted with the patient's  verbal consent via audio telecommunications: yes Patient was instructed to have this encounter in a suitably private space; and to only have persons present to whom they give permission to participate. In addition, patient identity was confirmed by use of name plus two identifiers (DOB and address).  I spent a total of 6mn talking with the patient     Subjective  Spoke with pt this morning and her CC today would be: She needs refill on her inhalers. No other refills needed at this time.   No travel outside the country.  Spoke with pt this morning and her CC today would be: She needs refill on her inhalers. No other refills needed at this time.   No travel outside the country.    TMALIA CORSIis a 43y.o. female established patient. Telephone visit today for worsening asthma  HPI  Pt with diagnosis of asthma in childhood. She states reoccurrence as an adult with albuterol and advair given to pt in the past . Pt [place]. Her albuterol is currently out of date. Pt does not have a nebulizer. Pt has not used singulair but has needed prednisone for asthma in the past . Pt states she is having SOB/tightness when she attempts to play outside with her children.pt also struggling with ADLs Pt working inside home and is exposed to dust. Pt states allergies worse in fall and summer but has experienced tightness when playing outside in the winter-? Cold. Pt states she  has used allergy medication-claritin and allegraD. Pt states flonase gives her a headache but she has used nasal washes and nasal saline   Patient Active Problem List   Diagnosis Date Noted  . Anxiety with depression 04/20/2016  . Vitamin D deficiency 12/14/2015  . Acquired hypothyroidism 12/14/2015  . Anemia, iron deficiency 12/03/2015  . Weight gain 12/03/2015  . Positive ANA (antinuclear antibody) 03/18/2014  . BMI 32.0-32.9,adult 12/11/2013    Past Medical History:  Diagnosis Date  . Adult acne   . ANA positive   . Anemia    iron infusions  . Asthma   . Chronic sinusitis    CT and MRI.   .Marland Kitchen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  . Fibromyalgia   . Foot fracture, left 08/2012  . History of appendicitis 09/2006  . History of blood transfusion    last time 02/2018  . Hypothyroidism   . Intramural uterine fibroid 2019  . Iron deficiency   . Menorrhagia   . Metatarsal fracture 10/2012   Right Proximal fifth metatarsal fracture  . Obesity   . Panic attack    at bedtime, awakes from sleep  . Pelvic pain   . Polyarthralgia    was seeing rheumatology  . PONV (postoperative nausea and vomiting)     Current Outpatient Medications  Medication Sig Dispense Refill  . albuterol (PROVENTIL HFA;VENTOLIN HFA) 108 (90 Base) MCG/ACT inhaler Inhale 1-2 puffs into the lungs every 4 (four) hours as needed for wheezing or  shortness of breath.    . levothyroxine (SYNTHROID, LEVOTHROID) 50 MCG tablet TAKE 1 TABLET ALTERNATING WITH 1-1/2 TABLETS BY MOUTH EVERY OTHER DAY DAILY (Patient taking differently: Take 50-75 mcg by mouth See admin instructions. TAKE 1 TABLET ALTERNATING WITH 1-1/2 TABLETS BY MOUTH EVERY OTHER DAY DAILY) 100 tablet 1  . venlafaxine XR (EFFEXOR-XR) 75 MG 24 hr capsule Take 1 capsule (75 mg total) by mouth daily with breakfast. 90 capsule 3   No current facility-administered medications for this visit.     Allergies  Allergen Reactions  .  Oxycodone-Acetaminophen Er     Swollen face, eyes and caused itching     Social History   Socioeconomic History  . Marital status: Married    Spouse name: Not on file  . Number of children: 4  . Years of education: Not on file  . 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 Financial resource strain: Not on file  . Food insecurity:    Worry: Not on file    Inability: Not on file  . Transportation needs:    Medical: Not on file    Non-medical: Not on file  Tobacco Use  . Smoking status: Never Smoker  . Smokeless tobacco: Never Used  Substance and Sexual Activity  . Alcohol use: Not Currently    Frequency: Never    Comment: occ   . Drug use: No  . Sexual activity: Yes    Partners: Male    Birth control/protection: Surgical    Comment: 1st intercourse- 20, partners- 74, married- 29 yrs   Lifestyle  . Physical activity:    Days per week: Not on file    Minutes per session: Not on file  . Stress: Not on file  Relationships  . Social connections:    Talks on phone: Not on file    Gets together: Not on file    Attends religious service: Not on file    Active member of club or organization: Not on file    Attends meetings of clubs or organizations: Not on file    Relationship status: Not on file  . Intimate partner violence:    Fear of current or ex partner: Not on file    Emotionally abused: Not on file    Physically abused: Not on file    Forced sexual activity: Not on file  Other Topics Concern  . Not on file  Social History Narrative   Sydney Vaughn lives in [place] with her husband and children. She has 4 daughters: ages ranging from 15 yo- 80 yo. 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      ROS CONSTITUTIONAL: no fever EENT:sinus problems, nasal congestion,CV: no chest pain, irregular heart beat, swelling of the legs or feet, pain in  the legs with walking RESP:SOB, cough, wheezing, sputum production-"tightness" GUR:[id number] anxious when she can not breathe  ALLERGY:seasonal allergies  Objective   Vitals as reported by the patient: none 1. Moderate persistent asthma with acute exacerbation Albuterol-MDI-rx-d/w pt at length how to take medication and suggestions for using prior to using advair twice a day. Trial of advair 250/50 BID-rx-pt has used in the past with no side effects reported. Avoid exposures to triggers-environmental.  I discussed the assessment and treatment plan with the patient. The patient was provided an opportunity to ask questions and all were answered.  The patient agreed with the plan and demonstrated an understanding of the instructions.   The patient was advised to call back or seek an in-person evaluation if the symptoms worsen or if the condition fails to improve as anticipated.  I provided 40 minutes of non-face-to-face time during this encounter.  Giannah Zavadil Hannah Beat, MD  Primary Care at [facility] 08-28-28

## 2018-08-29 NOTE — Patient Instructions (Addendum)
Asthma-albuterol MDI used as needed every 4-6 hours for SOB/cough Use 2-3 mins prior to advair to improve expansion for administration of advair Utilize methods for relaxation for anxiety-albuterol can have side effects that worsen symptoms

## 2018-08-30 ENCOUNTER — Encounter: Payer: Self-pay | Admitting: Hematology and Oncology

## 2018-10-11 ENCOUNTER — Other Ambulatory Visit: Payer: Self-pay

## 2018-10-11 ENCOUNTER — Telehealth: Payer: Self-pay

## 2018-10-11 NOTE — Telephone Encounter (Signed)
Patient sent a My Chart email stating she was having RLQ pain/discomfort and wanted to get an appointment asap. I called her right back and spoke with her. She has been having the pain/discomfort x one week. She said she also saw some spotting after sex. No UTI sx although last week some frequency but she said that has resolved. She rates her pain a 6-7 on pain scale.  Scheduled appt for 8:00am tomorrow morning.

## 2018-10-12 ENCOUNTER — Encounter: Payer: Self-pay | Admitting: Obstetrics & Gynecology

## 2018-10-12 ENCOUNTER — Ambulatory Visit: Payer: Federal, State, Local not specified - PPO | Admitting: Obstetrics & Gynecology

## 2018-10-12 VITALS — BP 128/80

## 2018-10-12 DIAGNOSIS — Z90710 Acquired absence of both cervix and uterus: Secondary | ICD-10-CM

## 2018-10-12 DIAGNOSIS — N898 Other specified noninflammatory disorders of vagina: Secondary | ICD-10-CM

## 2018-10-12 DIAGNOSIS — R102 Pelvic and perineal pain: Secondary | ICD-10-CM

## 2018-10-12 DIAGNOSIS — N951 Menopausal and female climacteric states: Secondary | ICD-10-CM | POA: Diagnosis not present

## 2018-10-12 DIAGNOSIS — N930 Postcoital and contact bleeding: Secondary | ICD-10-CM

## 2018-10-12 LAB — WET PREP FOR TRICH, YEAST, CLUE

## 2018-10-12 MED ORDER — TINIDAZOLE 500 MG PO TABS: 2.0000 g | ORAL_TABLET | Freq: Every day | ORAL | 0 refills | Status: AC

## 2018-10-12 MED ORDER — SULFAMETHOXAZOLE-TRIMETHOPRIM 800-160 MG PO TABS: 1.0000 | ORAL_TABLET | Freq: Two times a day (BID) | ORAL | 0 refills | Status: AC

## 2018-10-12 MED ORDER — FLUCONAZOLE 150 MG PO TABS: 150.0000 mg | ORAL_TABLET | Freq: Every day | ORAL | 2 refills | Status: AC

## 2018-10-12 NOTE — Progress Notes (Signed)
    Sydney Vaughn [birth year] [id number]        43 y.o.  [id number]  Married  RP: Suprapubic pain and postcoital spotting  HPI: S/P Robotic TLH/Bilateral Salpingectomy 04/2018.   Suprapubic pain x 2 days.  No pain with urination, no frequency.  No fever.  Post coital spotting recently.  Mild increase in vaginal discharge.  Hot flushes present.   OB History  Gravida Para Term Preterm AB Living  5 4     1 4   SAB TAB Ectopic Multiple Live Births  1            # Outcome Date GA Lbr Len/2nd Weight Sex Delivery Anes PTL Lv  5 SAB           4 Para           3 Para           2 Para           1 Para             Past medical history,surgical history, problem list, medications, allergies, family history and social history were all reviewed and documented in the EPIC chart.   Directed ROS with pertinent positives and negatives documented in the history of present illness/assessment and plan.  Exam:  Vitals:   10/12/18 0809  BP: 128/80   General appearance:  Normal  Abdomen: Tender in suprapubic area  CVAT Negative bilaterally  Gynecologic exam:  Vulva normal.  Speculum:  Vagina normal.  No granuloma, no lesion seen.  No blood or bleeding.  Increased thick vaginal secretions.  Bimanual exam:  No pelvic mass, moderately tender in midline.  U/A: Dark yellow cloudy, protein trace, nitrites negative, white blood cells 10-20, red blood cells 3-10, moderate bacteria.  Urine culture pending.  Wet prep: Clue cells present.  Yeast present.   Assessment/Plan:  43 y.o. [id number]   1. Suprapubic pain Probable acute cystitis clinically and per urine analysis.  Will treat with Bactrim DS 1 tablet twice a day for 3 days.  Usage reviewed and prescription sent to pharmacy.  Pending urine culture. - Urinalysis,Complete w/RFL Culture  2. Postcoital bleeding Mild postcoital bleeding with no lesions seen on gynecologic exam.  May be associated with yeast vaginitis as well as bacterial vaginosis  causing inflammation and easy bleeding with friction.  Will also verify menopausal status with an [facility] today.  Could be having postcoital spotting due to atrophic vaginitis and menopause.  3. Vaginal discharge Yeast vaginitis and bacterial vaginosis confirmed by wet prep.  Will treat with tinidazole 2 tablets twice daily for 2 days.  Usage reviewed and prescription sent to pharmacy.  Will then treat yeast vaginitis with fluconazole 150 mg/tab. 1 tablet per mouth daily for 3 days.  Usage reviewed and prescription sent to pharmacy. - WET PREP FOR TRICH, YEAST, CLUE  4. S/P total hysterectomy  5. Menopausal hot flushes Rule out menopause and thyroid dysfunction. - FSH - TSH  Other orders - sulfamethoxazole-trimethoprim (BACTRIM DS) 800-160 MG tablet; Take 1 tablet by mouth 2 (two) times daily for 3 days. - tinidazole (TINDAMAX) 500 MG tablet; Take 4 tablets (2,000 mg total) by mouth daily for 2 days. - fluconazole (DIFLUCAN) 150 MG tablet; Take 1 tablet (150 mg total) by mouth daily for 3 days.  Counseling on above issues and coordination of care more than 50% for 25 minutes.  Princess Bruins MD, 8:33 AM 10/12/2018

## 2018-10-12 NOTE — Patient Instructions (Signed)
1. Suprapubic pain Probable acute cystitis clinically and per urine analysis.  Will treat with Bactrim DS 1 tablet twice a day for 3 days.  Usage reviewed and prescription sent to pharmacy.  Pending urine culture. - Urinalysis,Complete w/RFL Culture  2. Postcoital bleeding Mild postcoital bleeding with no lesions seen on gynecologic exam.  May be associated with yeast vaginitis as well as bacterial vaginosis causing inflammation and easy bleeding with friction.  Will also verify menopausal status with an [facility] today.  Could be having postcoital spotting due to atrophic vaginitis and menopause.  3. Vaginal discharge Yeast vaginitis and bacterial vaginosis confirmed by wet prep.  Will treat with tinidazole 2 tablets twice daily for 2 days.  Usage reviewed and prescription sent to pharmacy.  Will then treat yeast vaginitis with fluconazole 150 mg/tab. 1 tablet per mouth daily for 3 days.  Usage reviewed and prescription sent to pharmacy. - WET PREP FOR TRICH, YEAST, CLUE  4. S/P total hysterectomy  5. Menopausal hot flushes Rule out menopause and thyroid dysfunction. - FSH - TSH  Other orders - sulfamethoxazole-trimethoprim (BACTRIM DS) 800-160 MG tablet; Take 1 tablet by mouth 2 (two) times daily for 3 days. - tinidazole (TINDAMAX) 500 MG tablet; Take 4 tablets (2,000 mg total) by mouth daily for 2 days. - fluconazole (DIFLUCAN) 150 MG tablet; Take 1 tablet (150 mg total) by mouth daily for 3 days.  Sydney Vaughn, it was a pleasure seeing you today!  I will inform you of your results as soon as they are available.

## 2018-10-13 LAB — FOLLICLE STIMULATING HORMONE: FSH: 9.8 m[IU]/mL

## 2018-10-13 LAB — TSH: TSH: 1.58 mIU/L

## 2018-10-14 LAB — URINE CULTURE
MICRO NUMBER:: 522382
SPECIMEN QUALITY:: ADEQUATE

## 2018-10-14 LAB — URINALYSIS, COMPLETE W/RFL CULTURE
Bilirubin Urine: NEGATIVE
Glucose, UA: NEGATIVE
Hyaline Cast: NONE SEEN /LPF
Ketones, ur: NEGATIVE
Nitrites, Initial: NEGATIVE
Specific Gravity, Urine: 1.025 (ref 1.001–1.03)
pH: 5.5 (ref 5.0–8.0)

## 2018-10-14 LAB — CULTURE INDICATED

## 2018-11-02 ENCOUNTER — Telehealth: Payer: Self-pay | Admitting: Endocrinology

## 2018-11-02 DIAGNOSIS — R7989 Other specified abnormal findings of blood chemistry: Secondary | ICD-10-CM

## 2018-11-08 ENCOUNTER — Other Ambulatory Visit: Payer: Self-pay

## 2018-11-08 DIAGNOSIS — R7989 Other specified abnormal findings of blood chemistry: Secondary | ICD-10-CM

## 2018-11-08 MED ORDER — LEVOTHYROXINE SODIUM 50 MCG PO TABS: ORAL_TABLET | ORAL | 0 refills | Status: DC

## 2018-11-08 NOTE — Telephone Encounter (Signed)
levothyroxine (SYNTHROID) 50 MCG tablet 16 tablet 0 11/08/2018    Sig: TAKE 1 TABLET ALTERNATING WITH 1-1/2 TABLETS BY MOUTH EVERY OTHER DAY DAILY   Sent to pharmacy as: levothyroxine (SYNTHROID) 50 MCG tablet   Notes to Pharmacy: Dr. Dwyane Dee allowing enough tablets to get through until next appt.   E-Prescribing Status: Receipt confirmed by pharmacy (11/08/2018 3:43 PM EDT)

## 2018-11-08 NOTE — Telephone Encounter (Signed)
Patient made appointment on 11/19/18, please refill for a few days

## 2018-11-12 ENCOUNTER — Other Ambulatory Visit: Payer: Self-pay

## 2018-11-13 ENCOUNTER — Encounter: Payer: Self-pay | Admitting: Obstetrics & Gynecology

## 2018-11-13 ENCOUNTER — Ambulatory Visit (INDEPENDENT_AMBULATORY_CARE_PROVIDER_SITE_OTHER): Payer: Federal, State, Local not specified - PPO | Admitting: Obstetrics & Gynecology

## 2018-11-13 ENCOUNTER — Other Ambulatory Visit: Payer: Self-pay | Admitting: Obstetrics & Gynecology

## 2018-11-13 ENCOUNTER — Ambulatory Visit (INDEPENDENT_AMBULATORY_CARE_PROVIDER_SITE_OTHER): Payer: Federal, State, Local not specified - PPO

## 2018-11-13 ENCOUNTER — Telehealth: Payer: Self-pay | Admitting: *Deleted

## 2018-11-13 VITALS — BP 126/80

## 2018-11-13 DIAGNOSIS — M5441 Lumbago with sciatica, right side: Secondary | ICD-10-CM | POA: Diagnosis not present

## 2018-11-13 DIAGNOSIS — N920 Excessive and frequent menstruation with regular cycle: Secondary | ICD-10-CM | POA: Diagnosis not present

## 2018-11-13 DIAGNOSIS — R1031 Right lower quadrant pain: Secondary | ICD-10-CM | POA: Diagnosis not present

## 2018-11-13 DIAGNOSIS — R102 Pelvic and perineal pain: Secondary | ICD-10-CM

## 2018-11-13 DIAGNOSIS — D219 Benign neoplasm of connective and other soft tissue, unspecified: Secondary | ICD-10-CM

## 2018-11-13 MED ORDER — IBUPROFEN 800 MG PO TABS: 800.0000 mg | ORAL_TABLET | Freq: Three times a day (TID) | ORAL | 0 refills | Status: DC | PRN

## 2018-11-13 MED ORDER — CYCLOBENZAPRINE HCL 5 MG PO TABS: 5.0000 mg | ORAL_TABLET | Freq: Three times a day (TID) | ORAL | 0 refills | Status: DC

## 2018-11-13 NOTE — Progress Notes (Signed)
    Sydney Vaughn Jan 12, 1976 [id number]        43 y.o.  [id number] Married  RP: Right lower abdominal pain x many weeks  HPI: S/P Robotic TLH/Bilateral Salpingectomy 04/18/2018.  Complaints of right lower abdominal pain worse when standing and walking but also increased by prolonged sitting.  Patient has pain in the right lower back as well and the pain is sometimes irradiating towards the right leg.  No numbness or change in sensation in the right leg.  Patient feels that the pain is worse in the lower back when she is not sitting straight, especially if sitting for a while.  No pain with intercourse.  No urinary frequency or burning when passing urine.  No blood in urine.  No fever.  Bowel movements normal.   OB History  Gravida Para Term Preterm AB Living  5 4     1 4   SAB TAB Ectopic Multiple Live Births  1            # Outcome Date GA Lbr Len/2nd Weight Sex Delivery Anes PTL Lv  5 SAB           4 Para           3 Para           2 Para           1 Para             Past medical history,surgical history, problem list, medications, allergies, family history and social history were all reviewed and documented in the EPIC chart.   Directed ROS with pertinent positives and negatives documented in the history of present illness/assessment and plan.  Exam:  Vitals:   11/13/18 0954  BP: 126/80   General appearance:  Normal  Abdomen: Normal  Gynecologic exam: Vulva normal.  Bimanual exam:  Vagina normal.  Vaginal vault normal.  S/P Total Hysterectomy.  No pelvic mass felt, NT.  Pelvic US today: T/V images.  Uterus is surgically absent.  Vaginal cuff normal.  Both ovaries normal size with normal follicular pattern and perfusion.  Left ovary with a 1.5 cm resolving corpus luteum.  No pelvic mass.  No free fluid seen in the pelvis.  U/A: Yellow cloudy, protein negative, nitrites negative, white blood cells 0-5, red blood cells 3-10, moderate bacteria.  Urine culture pending.    Assessment/Plan:  43 y.o. [id number]   1. Right lower quadrant pain Pelvic exam negative and pelvic ultrasound showing a normal vaginal cough with bilateral ovaries normal in size with a follicular pattern and good perfusion.  Left ovary with a resolving corpus luteum measuring 1.5 cm.  No pelvic mass seen on pelvic ultrasound.  Patient reassured.  Origin of pain is probably not gynecologic.  Urine analysis mildly perturbed, will wait on urine culture to decide on treatment. - Pelvic US TV and TA - Urinalysis,Complete w/RFL Culture  2. Acute right-sided low back pain with right-sided sciatica Most likely right lower back pain with radiation in the right pelvic area and right leg caused by a bulging disc in the lumbar area.  Refer to an orthopedist for investigation and management.  Will use ibuprofen as needed and Flexeril prescribed if ibuprofen not sufficient.  Counseling on above issues and coordination of care more than 50% for 25 minutes.  Princess Bruins MD, 10:08 AM 11/13/2018

## 2018-11-13 NOTE — Telephone Encounter (Signed)
-----   Message from Princess Bruins, MD sent at 11/13/2018 12:57 PM EDT ----- Regarding: Refer to [place] lower back pain with irradiation to the Rt leg.  Probable lumbar disk disease and sciatic pain.

## 2018-11-13 NOTE — Telephone Encounter (Signed)
Referral placed at [place] they will call and schedule.

## 2018-11-14 ENCOUNTER — Encounter: Payer: Self-pay | Admitting: Obstetrics & Gynecology

## 2018-11-14 NOTE — Telephone Encounter (Signed)
Patient scheduled on 11/19/18 1:40 pm with Dr. Junius Roads

## 2018-11-14 NOTE — Patient Instructions (Signed)
1. Right lower quadrant pain Pelvic exam negative and pelvic ultrasound showing a normal vaginal cough with bilateral ovaries normal in size with a follicular pattern and good perfusion.  Left ovary with a resolving corpus luteum measuring 1.5 cm.  No pelvic mass seen on pelvic ultrasound.  Patient reassured.  Origin of pain is probably not gynecologic. - Pelvic US TV and TA - Urinalysis,Complete w/RFL Culture  2. Acute right-sided low back pain with right-sided sciatica Most likely right lower back pain with radiation in the right pelvic area and right leg caused by a bulging disc in the lumbar area.  Refer to an orthopedist for investigation and management.  Will use ibuprofen as needed and Flexeril prescribed if ibuprofen not sufficient.  Cariana, it was a pleasure seeing you today!

## 2018-11-15 LAB — URINALYSIS, COMPLETE W/RFL CULTURE
Bilirubin Urine: NEGATIVE
Glucose, UA: NEGATIVE
Hyaline Cast: NONE SEEN /LPF
Ketones, ur: NEGATIVE
Leukocyte Esterase: NEGATIVE
Nitrites, Initial: NEGATIVE
Protein, ur: NEGATIVE
Specific Gravity, Urine: 1.025 (ref 1.001–1.03)
pH: 5 (ref 5.0–8.0)

## 2018-11-15 LAB — URINE CULTURE
MICRO NUMBER:: 625497
Result:: NO GROWTH
SPECIMEN QUALITY:: ADEQUATE

## 2018-11-15 LAB — CULTURE INDICATED

## 2018-11-17 NOTE — Progress Notes (Signed)
Patient ID: Sydney Vaughn, female   DOB: 1976/01/08, 43 y.o.   MRN: [id number]             Reason for Appointment:  Follow-up    History of Present Illness:   WEIGHT management:  Previous history: She thinks her weight had been in the 170s a couple of years ago and has gained weight which she cannot shed Patient said that she has seen a dietitian in 2017 and is trying to modify her diet with taking salads for lunch, avoiding fried food usually She is trying to avoid snacking, trying to bake  foods as well as usually trying to avoid high-fat meats and avoiding sweets as well.  However she is usually trying to prepare food for her family with 4 children.   Her weight was about the same in July 2017  Since she had expressed difficulty with weight loss on her own with diet and exercise she was started on phentermine and Topamax on her initial visit in 07/2016   On her initial follow-up she had lost 18 pounds and had no side effects with the 2 drugs She was able to cut back on portions  RECENT history:  She has not been seen in follow-up for over a year now since she forgot to reschedule her canceled appointment  In 2019 her Topamax was stopped because of her perception that it was causing memory difficulties Also having periodic rapid heartbeat with phentermine low doses  Without the weight loss medications as well as with not paying attention to her diet as well as not exercising she has gained 11 pounds recently and overall 25 pounds since last summer when she was here  Her insurance does not cover any brand-name weight loss medications She was recommended going to the weight loss program but she did not do so  Wt Readings from Last 3 Encounters:  11/19/18 205 lb 6.4 oz (93.2 kg)  06/19/18 194 lb (88 kg)  04/18/18 195 lb (88.5 kg)    Hypothyroidism was first diagnosed in 11/2015  At the time of diagnosis patient was having symptoms of fatigue, weight gain and hair loss. She  apparently has been having symptoms for several years the same way She has not had problems with cold intolerance except sometimes her feet get cold Baseline TSH was 7.7 and she was started on levothyroxine 25 g daily  On her initial consultation she was having symptoms of fatigue, having difficulty losing weight, fatigue on exercising and some hair loss although not as much, this is despite her taking the levothyroxine Also her thyroid functions including free T3 were normal  Recent history:  Overall feels fairly good She has been consistent with taking her thyroid supplement before breakfast She has had weight gain as above Since 05/2017 because of her high normal TSH she is taking 75 mcg levothyroxine alternating with 50 dosage  She was seen by the gynecologist in December and her TSH was high but patient does not remember discussion regarding this No change in dosage was made and she did not come here for follow-up  TSH was repeated by gynecologist about 6 weeks ago and it was back to normal without any change   Thyroid function results have been as follows:  Lab Results  Component Value Date   TSH 1.58 10/12/2018   TSH 6.62 (H) 05/11/2018   TSH 2.72 09/15/2017   FREET4 1.1 05/11/2018   FREET4 0.84 09/15/2017   FREET4 0.75 06/05/2017  Past Medical History:  Diagnosis Date  . Adult acne   . ANA positive   . Anemia    iron infusions  . Asthma   . Chronic sinusitis    CT and MRI.   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 Fibromyalgia   . Foot fracture, left 08/2012  . History of appendicitis 09/2006  . History of blood transfusion    last time 02/2018  . Hypothyroidism   . Intramural uterine fibroid 2019  . Iron deficiency   . Menorrhagia   . Metatarsal fracture 10/2012   Right Proximal fifth metatarsal fracture  . Obesity   . Panic attack    at bedtime, awakes from sleep  . Pelvic pain   . Polyarthralgia    was seeing rheumatology  .  PONV (postoperative nausea and vomiting)     Past Surgical History:  Procedure Laterality Date  . APPENDECTOMY    . BUNIONECTOMY Left   . DILATION AND EVACUATION    . TUBAL LIGATION      Family History  Problem Relation Age of Onset  . Thyroid disease Mother   . Sinusitis Sister   . Sinusitis Brother   . Lupus Daughter        treated at [facility]  . Allergies Daughter     Social History:  reports that she has never smoked. She has never used smokeless tobacco. She reports previous alcohol use. She reports that she does not use drugs.  Allergies:  Allergies  Allergen Reactions  . Oxycodone-Acetaminophen Er     Swollen face, eyes and caused itching     Allergies as of 11/19/2018      Reactions   Oxycodone-acetaminophen Er    Swollen face, eyes and caused itching       Medication List       Accurate as of November 19, 2018 10:18 AM. If you have any questions, ask your nurse or doctor.        albuterol 108 (90 Base) MCG/ACT inhaler Commonly known as: VENTOLIN HFA Inhale 1-2 puffs into the lungs every 4 (four) hours as needed for wheezing or shortness of breath.   cyclobenzaprine 5 MG tablet Commonly known as: FLEXERIL Take 1 tablet (5 mg total) by mouth every 8 (eight) hours.   ibuprofen 800 MG tablet Commonly known as: ADVIL Take 1 tablet (800 mg total) by mouth every 8 (eight) hours as needed.   levothyroxine 50 MCG tablet Commonly known as: SYNTHROID TAKE 1 TABLET ALTERNATING WITH 1-1/2 TABLETS BY MOUTH EVERY OTHER DAY DAILY   venlafaxine XR 75 MG 24 hr capsule Commonly known as: EFFEXOR-XR Take 1 capsule (75 mg total) by mouth daily with breakfast.         Review of Systems               Examination:    BP 128/80 (BP Location: Left Arm, Patient Position: Sitting, Cuff Size: Normal)   Pulse 93   Ht 5' 3.5" (1.613 m)   Wt 205 lb 6.4 oz (93.2 kg)   LMP 04/14/2018 (Exact Date)   SpO2 96%   BMI 35.81 kg/m    Thyroid not palpable  She has  generalized obesity  Assessment: OBESITY:  With using phentermine and Topamax she previously had kept her weight down but cannot take either 1 because of adverse effects As above she has gained 24 pounds but this is also partly related to poor diet and not  doing any exercise  Recently not able to be as active because of low back pain for which she is getting evaluated   HYPOTHYROIDISM, mild with highest TSH 7.7  Her TSH is now again normal with alternating 75 and 50 mcg of levothyroxine This is despite a transient increase in TSH in December and patient does not think this was related to not taking her supplement regularly Overall doing fairly good   PLAN:   Trial of Contrave as she may not be able to afford other weight loss drugs No contraindications to this medication currently  Discussed that she can try to see if this is affordable for her with a co-pay card Given her patient information on this She can start 1 tablet at breakfast for the first week and if no significant side effects go up to 2 tablets in the morning Discussed side effects of headache, nausea, malaise and as usual side effects  Elayne Snare 11/19/2018, 10:18 AM     Note: This office note was prepared with Dragon voice recognition system technology. Any transcriptional errors that result from this process are unintentional.

## 2018-11-19 ENCOUNTER — Other Ambulatory Visit: Payer: Self-pay

## 2018-11-19 ENCOUNTER — Ambulatory Visit (INDEPENDENT_AMBULATORY_CARE_PROVIDER_SITE_OTHER): Payer: Federal, State, Local not specified - PPO | Admitting: Family Medicine

## 2018-11-19 ENCOUNTER — Ambulatory Visit: Payer: Federal, State, Local not specified - PPO | Admitting: Endocrinology

## 2018-11-19 ENCOUNTER — Encounter: Payer: Self-pay | Admitting: Endocrinology

## 2018-11-19 ENCOUNTER — Encounter: Payer: Self-pay | Admitting: Family Medicine

## 2018-11-19 VITALS — BP 128/80 | HR 93 | Ht 63.5 in | Wt 205.4 lb

## 2018-11-19 DIAGNOSIS — M545 Low back pain, unspecified: Secondary | ICD-10-CM

## 2018-11-19 DIAGNOSIS — Z6832 Body mass index (BMI) 32.0-32.9, adult: Secondary | ICD-10-CM

## 2018-11-19 DIAGNOSIS — E669 Obesity, unspecified: Secondary | ICD-10-CM

## 2018-11-19 DIAGNOSIS — E063 Autoimmune thyroiditis: Secondary | ICD-10-CM

## 2018-11-19 MED ORDER — TIZANIDINE HCL 2 MG PO TABS: 2.0000 mg | ORAL_TABLET | Freq: Every evening | ORAL | 1 refills | Status: DC | PRN

## 2018-11-19 MED ORDER — NALTREXONE-BUPROPION HCL ER 8-90 MG PO TB12: ORAL_TABLET | ORAL | 1 refills | Status: DC

## 2018-11-19 MED ORDER — NABUMETONE 750 MG PO TABS: 750.0000 mg | ORAL_TABLET | Freq: Two times a day (BID) | ORAL | 6 refills | Status: DC | PRN

## 2018-11-19 NOTE — Patient Instructions (Signed)
Contrave 1 in am for 1 week then 2 in am

## 2018-11-19 NOTE — Progress Notes (Signed)
Office Visit Note   Patient: Sydney Vaughn           Date of Birth: 1975/10/15           MRN: [id number] Visit Date: 11/19/2018 Requested by: Horald Pollen, MD [place],  [place] [id number] PCP: Horald Pollen, MD  Subjective: Chief Complaint  Patient presents with  . Lower Back - Pain    Pain in lower back x 1 month. Mainly on the right, but occasionally across lower back. Pain in the right groin - constant, but worse when up on feet a lot.    HPI: She is here with right-sided low back pain.  Symptoms started about a month ago, no definite injury.  She has 18-monthold twin grandchildren and has been spending time with them, mostly sitting on a couch and bending to pick them up or put them down.  Around that time about a month ago she started noticing pain but she cannot pinpoint it to any specific moment.  She has had some pain radiating into the right posterior lateral hip but not down the leg.  No bowel or bladder dysfunction.  She underwent some work-up for kidney stone which was negative.  She has had a little relief with ibuprofen.  Overall she seems to be getting better but it is slow.  She is never had problems with her back before.  She is otherwise been in pretty good health although she is gained some weight lately.  She has been diagnosed with thyroid dysfunction.              ROS: No fevers or chills.  All other systems were reviewed and are negative.  Objective: Vital Signs: LMP 04/14/2018 (Exact Date)   Physical Exam:  General:  Alert and oriented, in no acute distress. Pulm:  Breathing unlabored. Psy:  Normal mood, congruent affect. Skin: No rash on her skin. Low back: No midline bony tenderness.  She has pain primarily over the right SI joint.  No pain in sciatic notch, no tenderness over the greater trochanter.  No pain with internal hip rotation.  Moderate pain with FCorky Soxtest.  Lower extremity strength and reflexes are normal.  Imaging:  None today.  Assessment & Plan: 1.  Right-sided low back pain, suspect sacroiliac dysfunction.  Cannot completely rule out disc protrusion although neurologic exam is nonfocal. -Muscle relaxant as needed, Relafen for inflammation.  Referral to Dr. LMolinda Bailifffor chiropractic.  Could try physical therapy if not improved.  X-rays and MRI scan if fails conservative management.     Procedures: No procedures performed  No notes on file     PMFS History: Patient Active Problem List   Diagnosis Date Noted  . Anxiety with depression 04/20/2016  . Vitamin D deficiency 12/14/2015  . Acquired hypothyroidism 12/14/2015  . Anemia, iron deficiency 12/03/2015  . Weight gain 12/03/2015  . Positive ANA (antinuclear antibody) 03/18/2014  . BMI 32.0-32.9,adult 12/11/2013  . Right foot pain 11/04/2012  . Allergic rhinitis 08/15/2011   Past Medical History:  Diagnosis Date  . Adult acne   . ANA positive   . Anemia    iron infusions  . Asthma   . Chronic sinusitis    CT and MRI.   .Marland Kitchen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  . Fibromyalgia   . Foot fracture, left 08/2012  . History of appendicitis 09/2006  . History of blood transfusion    last  time 02/2018  . Hypothyroidism   . Intramural uterine fibroid 2019  . Iron deficiency   . Menorrhagia   . Metatarsal fracture 10/2012   Right Proximal fifth metatarsal fracture  . Obesity   . Panic attack    at bedtime, awakes from sleep  . Pelvic pain   . Polyarthralgia    was seeing rheumatology  . PONV (postoperative nausea and vomiting)     Family History  Problem Relation Age of Onset  . Thyroid disease Mother   . Sinusitis Sister   . Sinusitis Brother   . Lupus Daughter        treated at [facility]  . Allergies Daughter     Past Surgical History:  Procedure Laterality Date  . APPENDECTOMY    . BUNIONECTOMY Left   . DILATION AND EVACUATION    . 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 Smoking status: Never Smoker  . Smokeless tobacco: Never Used  Substance and Sexual Activity  . Alcohol use: Not Currently    Frequency: Never    Comment: occ   . Drug use: No  . Sexual activity: Yes    Partners: Male    Birth control/protection: Surgical    Comment: 1st intercourse- 53, partners- 80, married- 89 yrs

## 2018-11-27 ENCOUNTER — Other Ambulatory Visit: Payer: Self-pay | Admitting: Family Medicine

## 2018-11-27 MED ORDER — FLUTICASONE-SALMETEROL 250-50 MCG/DOSE IN AEPB: 1.0000 | INHALATION_SPRAY | Freq: Two times a day (BID) | RESPIRATORY_TRACT | 0 refills | Status: DC

## 2018-12-11 ENCOUNTER — Other Ambulatory Visit: Payer: Self-pay | Admitting: Family Medicine

## 2018-12-11 MED ORDER — TIZANIDINE HCL 2 MG PO TABS: 2.0000 mg | ORAL_TABLET | Freq: Every evening | ORAL | 1 refills | Status: DC | PRN

## 2018-12-20 ENCOUNTER — Other Ambulatory Visit: Payer: Self-pay | Admitting: Endocrinology

## 2018-12-20 DIAGNOSIS — R7989 Other specified abnormal findings of blood chemistry: Secondary | ICD-10-CM

## 2018-12-21 ENCOUNTER — Telehealth: Payer: Self-pay | Admitting: Endocrinology

## 2018-12-21 NOTE — Telephone Encounter (Signed)
Patient called in regards to the [facility] message, "Caller's levothyroxine was not called in and caller would like to speak with the doctor. Declined nurse triage." Patient called upset in regards to not receiving a call from the doctor, she has major concerns about this medication.

## 2018-12-21 NOTE — Telephone Encounter (Signed)
She clearly had side effects from the medication and not an interaction.  If she cannot even take 1 tablet daily it is not worthwhile taking it since the target dose was 4 tablets daily

## 2018-12-21 NOTE — Telephone Encounter (Signed)
Patient was called and informed that the Rx was sent to the pharmacy.  Pt also had questions regarding her Contrave. Pt stated that she had serious s/e when she took it and stopped taking it after two days. Pt states that she became very week in the knees and was unable to walk without assistance and when she did stand up, she was very dizzy and unstable. Pt said she took this on a full stomach when she was at work, and the next day when she took it, the s/e were not quite as severe, but she was unable to get out of bed most of the day.  Pt stated that she read that the medication can interact with anxiety medications and she wanted to know if it could have been a possible interactions with her Effexor.  Pharmacist told her that the medication could be broken in half, even though it is time released. Pt would like to know if this is a possibility, because if the aforementioned sypmtoms are not interactions, maybe the dose was too strong.  Please advise.

## 2018-12-21 NOTE — Telephone Encounter (Signed)
Called pt and gave her MD message. Pt would like to know if there is an alternative or what her course of tx is from here on out.

## 2018-12-22 NOTE — Telephone Encounter (Signed)
Only other option is Saxenda injections daily, she will need to check with her insurance coverage.  Otherwise needs referral to [facility] weight loss management program

## 2018-12-24 NOTE — Telephone Encounter (Signed)
Called pt and informed of Dr. Ronnie Derby advice. Verbalized acceptance and understanding. States she will follow up with her insurance company to determine coverage. No further action required at this time.

## 2019-01-16 ENCOUNTER — Other Ambulatory Visit (INDEPENDENT_AMBULATORY_CARE_PROVIDER_SITE_OTHER): Payer: Federal, State, Local not specified - PPO

## 2019-01-16 ENCOUNTER — Other Ambulatory Visit: Payer: Self-pay

## 2019-01-16 DIAGNOSIS — E063 Autoimmune thyroiditis: Secondary | ICD-10-CM

## 2019-01-16 LAB — T4, FREE: Free T4: 0.69 ng/dL (ref 0.60–1.60)

## 2019-01-16 LAB — TSH: TSH: 4.07 u[IU]/mL (ref 0.35–4.50)

## 2019-01-23 ENCOUNTER — Other Ambulatory Visit: Payer: Self-pay

## 2019-01-23 ENCOUNTER — Encounter: Payer: Self-pay | Admitting: Endocrinology

## 2019-01-23 ENCOUNTER — Ambulatory Visit (INDEPENDENT_AMBULATORY_CARE_PROVIDER_SITE_OTHER): Payer: Federal, State, Local not specified - PPO | Admitting: Endocrinology

## 2019-01-23 DIAGNOSIS — E063 Autoimmune thyroiditis: Secondary | ICD-10-CM | POA: Diagnosis not present

## 2019-01-23 DIAGNOSIS — E669 Obesity, unspecified: Secondary | ICD-10-CM

## 2019-01-23 DIAGNOSIS — Z6832 Body mass index (BMI) 32.0-32.9, adult: Secondary | ICD-10-CM | POA: Diagnosis not present

## 2019-01-23 NOTE — Progress Notes (Signed)
Patient ID: Sydney Vaughn, female   DOB: [birth year], 43 y.o.   MRN: [id number]             Reason for Appointment:  Follow-up  Today's office visit was provided via telemedicine using a telephone call to the patient since video connection failed Patient has been explained the limitations of evaluation and management by telemedicine and the availability of in person appointments.  The patient understood the limitations and agreed to proceed. Patient also understood that the telehealth visit is billable. . Location of the patient: Home . Location of the provider: Office Only the patient and myself were participating in the encounter   History of Present Illness:   WEIGHT management:  Previous history: She thinks her weight had been in the 170s a couple of years ago and has gained weight which she cannot shed Patient said that she has seen a dietitian in 2017 and is trying to modify her diet with taking salads for lunch, avoiding fried food usually She is trying to avoid snacking, trying to bake  foods as well as usually trying to avoid high-fat meats and avoiding sweets as well.  However she is usually trying to prepare food for her family with 4 children.   Her weight was about the same in July 2017  Since she had expressed difficulty with weight loss on her own with diet and exercise she was started on phentermine and Topamax on her initial visit in 07/2016   On her initial follow-up she had lost 18 pounds and had no side effects with the 2 drugs She was able to cut back on portions  RECENT history:  In 2019 her Topamax was stopped because of her perception that it was causing memory difficulties Also having periodic rapid heartbeat with phentermine low doses She was tried on Contrave and she had nausea and dizziness with the first tablet  She was told to look into Saxenda but she felt that she was afraid of side effects and the injection process Although she is trying to get some  exercise on the weekends and has a little improvement her weight is still significantly higher than before  She was recommended going to the weight loss program previously and is open to doing this now  Wt Readings from Last 3 Encounters:  11/19/18 205 lb 6.4 oz (93.2 kg)  06/19/18 194 lb (88 kg)  04/18/18 195 lb (88.5 kg)    Hypothyroidism was first diagnosed in 11/2015  At the time of diagnosis patient was having symptoms of fatigue, weight gain and hair loss. She apparently has been having symptoms for several years the same way She has not had problems with cold intolerance except sometimes her feet get cold Baseline TSH was 7.7 and she was started on levothyroxine 25 g daily  On her initial consultation she was having symptoms of fatigue, having difficulty losing weight, fatigue on exercising and some hair loss although not as much, this is despite her taking the levothyroxine Also her thyroid functions including free T3 were normal  Recent history:  Does not complain of fatigue She has been consistent with taking her thyroid supplement soon after waking up  Since 05/2017 because of her high normal TSH she is taking 75 mcg levothyroxine alternating with 50 dosage  She was seen by the gynecologist in December and her TSH was high but subsequently back to normal without dosage change  She is now having a high normal TSH   Thyroid function  results have been as follows:  Lab Results  Component Value Date   TSH 4.07 01/16/2019   TSH 1.58 10/12/2018   TSH 6.62 (H) 05/11/2018   FREET4 0.69 01/16/2019   FREET4 1.1 05/11/2018   FREET4 0.84 09/15/2017      Past Medical History:  Diagnosis Date  . Adult acne   . ANA positive   . Anemia    iron infusions  . Asthma   . Chronic sinusitis    CT and MRI.   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 Fibromyalgia   . Foot fracture, left 08/2012  . History of appendicitis 09/2006  . History of blood transfusion     last time 02/2018  . Hypothyroidism   . Intramural uterine fibroid 2019  . Iron deficiency   . Menorrhagia   . Metatarsal fracture 10/2012   Right Proximal fifth metatarsal fracture  . Obesity   . Panic attack    at bedtime, awakes from sleep  . Pelvic pain   . Polyarthralgia    was seeing rheumatology  . PONV (postoperative nausea and vomiting)     Past Surgical History:  Procedure Laterality Date  . APPENDECTOMY    . BUNIONECTOMY Left   . DILATION AND EVACUATION    . TUBAL LIGATION      Family History  Problem Relation Age of Onset  . Thyroid disease Mother   . Sinusitis Sister   . Sinusitis Brother   . Lupus Daughter        treated at [facility]  . Allergies Daughter     Social History:  reports that she has never smoked. She has never used smokeless tobacco. She reports previous alcohol use. She reports that she does not use drugs.  Allergies:  Allergies  Allergen Reactions  . Oxycodone-Acetaminophen Er     Swollen face, eyes and caused itching     Allergies as of 01/23/2019      Reactions   Oxycodone-acetaminophen Er    Swollen face, eyes and caused itching       Medication List       Accurate as of January 23, 2019  8:25 AM. If you have any questions, ask your nurse or doctor.        albuterol 108 (90 Base) MCG/ACT inhaler Commonly known as: VENTOLIN HFA Inhale 1-2 puffs into the lungs every 4 (four) hours as needed for wheezing or shortness of breath.   cyclobenzaprine 5 MG tablet Commonly known as: FLEXERIL Take 1 tablet (5 mg total) by mouth every 8 (eight) hours.   Fluticasone-Salmeterol 250-50 MCG/DOSE Aepb Commonly known as: Advair Diskus Inhale 1 puff into the lungs 2 (two) times daily.   ibuprofen 800 MG tablet Commonly known as: ADVIL Take 1 tablet (800 mg total) by mouth every 8 (eight) hours as needed.   levothyroxine 50 MCG tablet Commonly known as: SYNTHROID TAKE 1 TABLET ALTERNATING WITH 1-1/2 TABLETS BY MOUTH EVERY OTHER  DAY DAILY   nabumetone 750 MG tablet Commonly known as: RELAFEN Take 1 tablet (750 mg total) by mouth 2 (two) times daily as needed.   Naltrexone-buPROPion HCl ER 8-90 MG Tb12 1 tablet with breakfast for 7 days and then 2 tablets in a.m. daily   tiZANidine 2 MG tablet Commonly known as: ZANAFLEX Take 1-2 tablets (2-4 mg total) by mouth at bedtime as needed for muscle spasms.   venlafaxine XR 75 MG 24 hr capsule Commonly known as: EFFEXOR-XR  Take 1 capsule (75 mg total) by mouth daily with breakfast.         Review of Systems               Examination:    LMP 04/14/2018 (Exact Date)     Assessment:  OBESITY:  Previously with phentermine, Contrave and Topamax she had side effects She has not done as much exercise as she can and discussed needing to try and do some more during the way days at lunchtime Her weight has leveled off recently   HYPOTHYROIDISM, mild with highest TSH 7.7  Her TSH is now high normal with alternating 75 and 50 mcg of levothyroxine She is taking her supplement regularly   PLAN:   Trial of Saxenda if it is covered by insurance, may need prior authorization Discussed how this works, safety and where the injection would be done She can be instructed by the nurse educator  On her levothyroxine she will take 1-1/2 tablets 5 days a week and half tablet on weekends  Follow-up in 3 months Quitman Norberto 01/23/2019, 8:25 AM     Note: This office note was prepared with Estate agent. Any transcriptional errors that result from this process are unintentional.

## 2019-03-05 ENCOUNTER — Encounter: Payer: Self-pay | Admitting: Emergency Medicine

## 2019-03-07 ENCOUNTER — Encounter: Payer: Self-pay | Admitting: Endocrinology

## 2019-03-07 ENCOUNTER — Encounter: Payer: Self-pay | Admitting: Emergency Medicine

## 2019-03-08 ENCOUNTER — Other Ambulatory Visit: Payer: Self-pay | Admitting: Endocrinology

## 2019-03-08 ENCOUNTER — Telehealth: Payer: Self-pay | Admitting: Endocrinology

## 2019-03-08 MED ORDER — PHENTERMINE HCL 15 MG PO CAPS: 15.0000 mg | ORAL_CAPSULE | ORAL | 0 refills | Status: DC

## 2019-03-08 MED ORDER — TOPIRAMATE 25 MG PO TABS: 25.0000 mg | ORAL_TABLET | Freq: Two times a day (BID) | ORAL | 0 refills | Status: DC

## 2019-03-08 NOTE — Telephone Encounter (Signed)
Please let her know that her prescriptions have been sent for 30 days as requested but needs to be seen in follow-up in 30 days for follow-up visit in the office including blood pressure check

## 2019-03-12 NOTE — Telephone Encounter (Signed)
LMTCB to schedule appointment

## 2019-03-17 ENCOUNTER — Other Ambulatory Visit: Payer: Self-pay | Admitting: Endocrinology

## 2019-03-17 DIAGNOSIS — R7989 Other specified abnormal findings of blood chemistry: Secondary | ICD-10-CM

## 2019-03-26 NOTE — Telephone Encounter (Signed)
LMTCB x 2 to schedule appointment(s)

## 2019-03-30 ENCOUNTER — Other Ambulatory Visit: Payer: Self-pay | Admitting: Endocrinology

## 2019-04-01 ENCOUNTER — Telehealth: Payer: Self-pay | Admitting: Endocrinology

## 2019-04-01 ENCOUNTER — Other Ambulatory Visit: Payer: Self-pay | Admitting: Endocrinology

## 2019-04-01 DIAGNOSIS — D508 Other iron deficiency anemias: Secondary | ICD-10-CM

## 2019-04-01 NOTE — Telephone Encounter (Signed)
Okay to refill?  And do you want to sig to say 2X daily?

## 2019-04-01 NOTE — Telephone Encounter (Signed)
She needs to make follow-up appointment and then we can send the prescription as twice a day

## 2019-04-01 NOTE — Telephone Encounter (Signed)
Patient has scheduled a lab and f/u appointment.  She is requesting that her iron be checked when she comes in for her labs

## 2019-04-03 NOTE — Telephone Encounter (Signed)
Patient is scheduled for labs 04/08/19 and follow up appointment 04/17/19

## 2019-04-08 ENCOUNTER — Other Ambulatory Visit: Payer: Self-pay

## 2019-04-08 ENCOUNTER — Other Ambulatory Visit (INDEPENDENT_AMBULATORY_CARE_PROVIDER_SITE_OTHER): Payer: Federal, State, Local not specified - PPO

## 2019-04-08 DIAGNOSIS — E063 Autoimmune thyroiditis: Secondary | ICD-10-CM

## 2019-04-08 DIAGNOSIS — D508 Other iron deficiency anemias: Secondary | ICD-10-CM

## 2019-04-08 LAB — IBC PANEL
Iron: 50 ug/dL (ref 42–145)
Saturation Ratios: 16.5 % — ABNORMAL LOW (ref 20.0–50.0)
Transferrin: 216 mg/dL (ref 212.0–360.0)

## 2019-04-08 LAB — CBC
HCT: 42.8 % (ref 36.0–46.0)
Hemoglobin: 14.7 g/dL (ref 12.0–15.0)
MCHC: 34.3 g/dL (ref 30.0–36.0)
MCV: 93.4 fl (ref 78.0–100.0)
Platelets: 308 10*3/uL (ref 150.0–400.0)
RBC: 4.58 Mil/uL (ref 3.87–5.11)
RDW: 13.5 % (ref 11.5–15.5)
WBC: 8.9 10*3/uL (ref 4.0–10.5)

## 2019-04-08 LAB — T4, FREE: Free T4: 0.83 ng/dL (ref 0.60–1.60)

## 2019-04-08 LAB — TSH: TSH: 4.09 u[IU]/mL (ref 0.35–4.50)

## 2019-04-15 ENCOUNTER — Telehealth: Payer: Self-pay

## 2019-04-15 NOTE — Telephone Encounter (Signed)
Okay to refill or does pt need to be seen first?

## 2019-04-15 NOTE — Telephone Encounter (Signed)
MEDICATION: phentermine 15 MG capsule  PHARMACY:  [id number]- [place], [place] - [address]  IS THIS A 90 DAY SUPPLY :   IS PATIENT OUT OF MEDICATION: yes   IF NOT; HOW MUCH IS LEFT:   LAST APPOINTMENT DATE: @11 /16/2020  NEXT APPOINTMENT DATE:@12 /06/2018  DO WE HAVE YOUR PERMISSION TO LEAVE A DETAILED MESSAGE:  OTHER COMMENTS:    **Let patient know to contact pharmacy at the end of the day to make sure medication is ready. **  ** Please notify patient to allow 48-72 hours to process**  **Encourage patient to contact the pharmacy for refills or [facility]

## 2019-04-16 ENCOUNTER — Other Ambulatory Visit: Payer: Self-pay | Admitting: Endocrinology

## 2019-04-16 MED ORDER — PHENTERMINE HCL 15 MG PO CAPS: 15.0000 mg | ORAL_CAPSULE | ORAL | 0 refills | Status: DC

## 2019-04-17 ENCOUNTER — Other Ambulatory Visit: Payer: Self-pay

## 2019-04-17 ENCOUNTER — Ambulatory Visit: Payer: Federal, State, Local not specified - PPO | Admitting: Endocrinology

## 2019-04-17 NOTE — Progress Notes (Signed)
Patient ID: Sydney Vaughn, female   DOB: 23-Nov-1975, 43 y.o.   MRN: [id number]             Reason for Appointment: Endocrinology follow-up    History of Present Illness:   WEIGHT management:  Previous history: She thinks her weight had been in the 170s a couple of years ago and has gained weight which she cannot shed Patient said that she has seen a dietitian in 2017 and is trying to modify her diet with taking salads for lunch, avoiding fried food usually She is trying to avoid snacking, trying to bake  foods as well as usually trying to avoid high-fat meats and avoiding sweets as well.  However she is usually trying to prepare food for her family with 4 children.   Her weight was about the same in July 2017  Since she had expressed difficulty with weight loss on her own with diet and exercise she was started on phentermine and Topamax on her initial visit in 07/2016   On her initial follow-up she had lost 18 pounds and had no side effects with the 2 drugs She was able to cut back on portions  RECENT history:  In 2019 her Topamax was stopped because of her feeling that it was causing memory difficulties Also having periodic rapid heartbeat with phentermine low doses She was tried on Contrave and she had nausea and dizziness with the first tablet  She was told to look into Saxenda but she felt that she was afraid of side effects and the injection was not covered by insurance  She had gained about 11 pounds earlier this year However she had called and wanted to try Topamax and phentermine again which she has started since 02/2019 She initially took 1 tablet daily and then twice a day for the last 3 weeks or so Initially had a little blurring of vision and fatigue but she thinks she is feeling accustomed to it now and also does not think it is affecting her memory Does not complain of any palpitations with phentermine She has been able to watch her diet better with these medications  She now has exercise equipment at home and is trying to exercise almost daily  Her weight appears to be improving significantly now  Wt Readings from Last 3 Encounters:  04/18/19 193 lb 3.2 oz (87.6 kg)  11/19/18 205 lb 6.4 oz (93.2 kg)  06/19/18 194 lb (88 kg)    Hypothyroidism was first diagnosed in 11/2015  At the time of diagnosis patient was having symptoms of fatigue, weight gain and hair loss. She apparently has been having symptoms for several years the same way She has not had problems with cold intolerance except sometimes her feet get cold Baseline TSH was 7.7 and she was started on levothyroxine 25 g daily  On her initial consultation she was having symptoms of fatigue, having difficulty losing weight, fatigue on exercising and some hair loss although not as much, this is despite her taking the levothyroxine Also her thyroid functions including free T3 were normal  Recent history:  Does not complain of fatigue She has been consistent with taking her thyroid supplement soon after waking up  Since 05/2017 her dose has been increased slightly  Since her last visit in 9/20 she is taking 75 mcg, 5 days a week and 50 mcg on weekends only Does not think she is fatigued and is able to exercise Likely feeling better with her weight loss as  above No cold intolerance  She is now still showing a high normal TSH  Thyroid function results have been as follows:  Lab Results  Component Value Date   TSH 4.09 04/08/2019   TSH 4.07 01/16/2019   TSH 1.58 10/12/2018   FREET4 0.83 04/08/2019   FREET4 0.69 01/16/2019   FREET4 1.1 05/11/2018      Past Medical History:  Diagnosis Date  . Adult acne   . ANA positive   . Anemia    iron infusions  . Asthma   . Chronic sinusitis    CT and MRI.   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 Fibromyalgia   . Foot fracture, left 08/2012  . History of appendicitis 09/2006  . History of blood transfusion    last time  02/2018  . Hypothyroidism   . Intramural uterine fibroid 2019  . Iron deficiency   . Menorrhagia   . Metatarsal fracture 10/2012   Right Proximal fifth metatarsal fracture  . Obesity   . Panic attack    at bedtime, awakes from sleep  . Pelvic pain   . Polyarthralgia    was seeing rheumatology  . PONV (postoperative nausea and vomiting)     Past Surgical History:  Procedure Laterality Date  . APPENDECTOMY    . BUNIONECTOMY Left   . DILATION AND EVACUATION    . TUBAL LIGATION      Family History  Problem Relation Age of Onset  . Thyroid disease Mother   . Sinusitis Sister   . Sinusitis Brother   . Lupus Daughter        treated at [facility]  . Allergies Daughter     Social History:  reports that she has never smoked. She has never used smokeless tobacco. She reports previous alcohol use. She reports that she does not use drugs.  Allergies:  Allergies  Allergen Reactions  . Contrave [Naltrexone-Bupropion Hcl Er] Nausea Only    Nausea and dizziness  . Oxycodone-Acetaminophen Er     Swollen face, eyes and caused itching     Allergies as of 04/18/2019      Reactions   Contrave [naltrexone-bupropion Hcl Er] Nausea Only   Nausea and dizziness   Oxycodone-acetaminophen Er    Swollen face, eyes and caused itching       Medication List       Accurate as of April 18, 2019 12:03 PM. If you have any questions, ask your nurse or doctor.        STOP taking these medications   cyclobenzaprine 5 MG tablet Commonly known as: FLEXERIL Stopped by: Elayne Snare, MD   Fluticasone-Salmeterol 250-50 MCG/DOSE Aepb Commonly known as: Advair Diskus Stopped by: Elayne Snare, MD   ibuprofen 800 MG tablet Commonly known as: ADVIL Stopped by: Elayne Snare, MD   nabumetone 750 MG tablet Commonly known as: RELAFEN Stopped by: Elayne Snare, MD   Naltrexone-buPROPion HCl ER 8-90 MG Tb12 Stopped by: Elayne Snare, MD   tiZANidine 2 MG tablet Commonly known as: ZANAFLEX Stopped by:  Elayne Snare, MD     TAKE these medications   albuterol 108 (90 Base) MCG/ACT inhaler Commonly known as: VENTOLIN HFA Inhale 1-2 puffs into the lungs every 4 (four) hours as needed for wheezing or shortness of breath.   levothyroxine 50 MCG tablet Commonly known as: SYNTHROID TAKE 1 TABLET ALTERNATING WITH 1-1/2 TABLETS BY MOUTH EVERY OTHER DAY DAILY   phentermine 15 MG capsule Take  1 capsule (15 mg total) by mouth every morning.   topiramate 25 MG tablet Commonly known as: Topamax Take 1 tablet (25 mg total) by mouth 2 (two) times daily. Start 1 daily in pm for 15 days then 2x daily   venlafaxine XR 75 MG 24 hr capsule Commonly known as: EFFEXOR-XR Take 1 capsule (75 mg total) by mouth daily with breakfast.         Review of Systems      She has been taking venlafaxine for depression and anxiety with relief         Examination:    BP 114/70 (BP Location: Left Arm, Patient Position: Sitting, Cuff Size: Normal)   Pulse 98   Ht 5' 3.5" (1.613 m)   Wt 193 lb 3.2 oz (87.6 kg)   LMP 04/14/2018 (Exact Date)   SpO2 97%   BMI 33.69 kg/m   Repeat pulse rate 87 Thyroid not palpable  Assessment:  OBESITY:  Her highest BMI has been about 36  She does significantly better with weight loss medications along with exercise With starting phentermine and Topamax since late October she has had no side effects Pulses slightly fast but she does not complain of any rapid heart rate Also no decrease in concentration or memory with Topamax 50 mg daily at this time She has lost about 12 pounds since July  HYPOTHYROIDISM, mild with highest TSH 7.7  Her TSH is again high normal No fatigue Appears to have mildly progressive autoimmune thyroid disease She is taking her levothyroxine prescriptions regularly   PLAN:   Continue exercise regimen, phentermine 15 mg and Topamax 25 twice daily  She will increase her levothyroxine to 75 mcg  Follow-up in 3 months  Boy Delamater  04/18/2019, 12:03 PM    This visit occurred during the SARS-CoV-2 public health emergency.  Safety protocols were in place, including screening questions prior to the visit, additional usage of staff PPE, and extensive cleaning of exam room while observing appropriate contact time as indicated for disinfecting solutions.    Note: This office note was prepared with Estate agent. Any transcriptional errors that result from this process are unintentional.

## 2019-04-18 ENCOUNTER — Encounter: Payer: Self-pay | Admitting: Endocrinology

## 2019-04-18 ENCOUNTER — Ambulatory Visit (INDEPENDENT_AMBULATORY_CARE_PROVIDER_SITE_OTHER): Payer: Federal, State, Local not specified - PPO | Admitting: Endocrinology

## 2019-04-18 VITALS — BP 114/70 | HR 98 | Ht 63.5 in | Wt 193.2 lb

## 2019-04-18 DIAGNOSIS — Z6832 Body mass index (BMI) 32.0-32.9, adult: Secondary | ICD-10-CM

## 2019-04-18 DIAGNOSIS — E669 Obesity, unspecified: Secondary | ICD-10-CM | POA: Diagnosis not present

## 2019-04-18 DIAGNOSIS — E063 Autoimmune thyroiditis: Secondary | ICD-10-CM

## 2019-04-18 DIAGNOSIS — Z131 Encounter for screening for diabetes mellitus: Secondary | ICD-10-CM | POA: Diagnosis not present

## 2019-05-03 ENCOUNTER — Other Ambulatory Visit: Payer: Self-pay | Admitting: Endocrinology

## 2019-05-03 DIAGNOSIS — R7989 Other specified abnormal findings of blood chemistry: Secondary | ICD-10-CM

## 2019-05-06 ENCOUNTER — Other Ambulatory Visit: Payer: Self-pay

## 2019-05-06 MED ORDER — LEVOTHYROXINE SODIUM 75 MCG PO TABS: 75.0000 ug | ORAL_TABLET | Freq: Every day | ORAL | 0 refills | Status: DC

## 2019-05-08 ENCOUNTER — Other Ambulatory Visit: Payer: Self-pay | Admitting: Endocrinology

## 2019-05-14 ENCOUNTER — Other Ambulatory Visit: Payer: Self-pay | Admitting: Endocrinology

## 2019-05-14 ENCOUNTER — Ambulatory Visit: Payer: Federal, State, Local not specified - PPO | Attending: Internal Medicine

## 2019-05-14 DIAGNOSIS — Z20822 Contact with and (suspected) exposure to covid-19: Secondary | ICD-10-CM

## 2019-05-14 DIAGNOSIS — Z20828 Contact with and (suspected) exposure to other viral communicable diseases: Secondary | ICD-10-CM | POA: Diagnosis not present

## 2019-05-15 LAB — NOVEL CORONAVIRUS, NAA: SARS-CoV-2, NAA: NOT DETECTED

## 2019-05-16 ENCOUNTER — Other Ambulatory Visit: Payer: Self-pay

## 2019-05-16 ENCOUNTER — Encounter: Payer: Self-pay | Admitting: Emergency Medicine

## 2019-05-16 ENCOUNTER — Ambulatory Visit: Payer: Federal, State, Local not specified - PPO | Admitting: Emergency Medicine

## 2019-05-16 VITALS — BP 117/87 | HR 93 | Temp 98.3°F | Resp 16 | Ht 63.5 in | Wt 190.2 lb

## 2019-05-16 DIAGNOSIS — H9203 Otalgia, bilateral: Secondary | ICD-10-CM

## 2019-05-16 DIAGNOSIS — H6690 Otitis media, unspecified, unspecified ear: Secondary | ICD-10-CM | POA: Diagnosis not present

## 2019-05-16 MED ORDER — AMOXICILLIN-POT CLAVULANATE 875-125 MG PO TABS: 1.0000 | ORAL_TABLET | Freq: Two times a day (BID) | ORAL | 0 refills | Status: AC

## 2019-05-16 NOTE — Patient Instructions (Addendum)
     If you have lab work done today you will be contacted with your lab results within the next 2 weeks.  If you have not heard from [place] then please contact us. The fastest way to get your results is to register for My Chart.   IF you received an x-ray today, you will receive an invoice from [facility]. Please contact [facility] at [phone] with questions or concerns regarding your invoice.   IF you received labwork today, you will receive an invoice from [place]. Please contact [facility] at [phone] with questions or concerns regarding your invoice.   Our billing staff will not be able to assist you with questions regarding bills from these companies.  You will be contacted with the lab results as soon as they are available. The fastest way to get your results is to activate your My Chart account. Instructions are located on the last page of this paperwork. If you have not heard from [place] regarding the results in 2 weeks, please contact this office.     Otitis Media, Adult  Otitis media means that the middle ear is red and swollen (inflamed) and full of fluid. The condition usually goes away on its own. Follow these instructions at home:  Take over-the-counter and prescription medicines only as told by your doctor.  If you were prescribed an antibiotic medicine, take it as told by your doctor. Do not stop taking the antibiotic even if you start to feel better.  Keep all follow-up visits as told by your doctor. This is important. Contact a doctor if:  You have bleeding from your nose.  There is a lump on your neck.  You are not getting better in 5 days.  You feel worse instead of better. Get help right away if:  You have pain that is not helped with medicine.  You have swelling, redness, or pain around your ear.  You get a stiff neck.  You cannot move part of your face (paralyzed).  You notice that the bone behind your ear hurts when you touch  it.  You get a very bad headache. Summary  Otitis media means that the middle ear is red, swollen, and full of fluid.  This condition usually goes away on its own. In some cases, treatment may be needed.  If you were prescribed an antibiotic medicine, take it as told by your doctor. This information is not intended to replace advice given to you by your health care provider. Make sure you discuss any questions you have with your health care provider. Document Revised: 04/14/2017 Document Reviewed: 05/23/2016 Elsevier Patient Education  2020 [employer].

## 2019-05-16 NOTE — Progress Notes (Signed)
Sydney Vaughn 43 y.o.   Chief Complaint  Patient presents with  . Ear Pain    no dranage, Sharp shooting pain down pt's R jar. pain is mainly on her R side but hteir is some pain in her L ear as well.problem has been going on since 05/09/2019  . Headache    headache is constant and pt has sencitivity to loaud noises and bright light. pain is location in optical area and back or the head.. problem has been going on since a couple days before christmas.    HISTORY OF PRESENT ILLNESS: This is a 43 y.o. female complaining of bilateral ear pain for the past week but sometimes leads to headaches.  Sharp pain mostly on right ear.  No other associated symptoms.  HPI   Prior to Admission medications   Medication Sig Start Date End Date Taking? Authorizing Provider  albuterol (PROVENTIL HFA;VENTOLIN HFA) 108 (90 Base) MCG/ACT inhaler Inhale 1-2 puffs into the lungs every 4 (four) hours as needed for wheezing or shortness of breath. 08/29/18  Yes [place], Rex Kras, MD  levothyroxine (SYNTHROID) 75 MCG tablet Take 1 tablet (75 mcg total) by mouth daily before breakfast. 05/06/19  Yes Elayne Snare, MD  phentermine 15 MG capsule Take 1 capsule (15 mg total) by mouth every morning. 04/16/19  Yes Elayne Snare, MD  topiramate (TOPAMAX) 25 MG tablet TAKE 1 TABLET BY MOUTH 2 (TWO) TIMES DAILY. START 1 DAILY IN PM FOR 15 DAYS THEN 2X DAILY 05/09/19  Yes Elayne Snare, MD  venlafaxine XR (EFFEXOR-XR) 75 MG 24 hr capsule Take 1 capsule (75 mg total) by mouth daily with breakfast. 08/10/18  Yes [place], Ines Bloomer, MD    Allergies  Allergen Reactions  . Contrave [Naltrexone-Bupropion Hcl Er] Nausea Only    Nausea and dizziness  . Oxycodone-Acetaminophen Er     Swollen face, eyes and caused itching     Patient Active Problem List   Diagnosis Date Noted  . Anxiety with depression 04/20/2016  . Vitamin D deficiency 12/14/2015  . Acquired hypothyroidism 12/14/2015  . Anemia, iron deficiency 12/03/2015  . Weight  gain 12/03/2015  . Positive ANA (antinuclear antibody) 03/18/2014  . BMI 32.0-32.9,adult 12/11/2013  . Right foot pain 11/04/2012  . Allergic rhinitis 08/15/2011    Past Medical History:  Diagnosis Date  . Adult acne   . ANA positive   . Anemia    iron infusions  . Asthma   . Chronic sinusitis    CT and MRI.   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 Fibromyalgia   . Foot fracture, left 08/2012  . History of appendicitis 09/2006  . History of blood transfusion    last time 02/2018  . Hypothyroidism   . Intramural uterine fibroid 2019  . Iron deficiency   . Menorrhagia   . Metatarsal fracture 10/2012   Right Proximal fifth metatarsal fracture  . Obesity   . Panic attack    at bedtime, awakes from sleep  . Pelvic pain   . Polyarthralgia    was seeing rheumatology  . PONV (postoperative nausea and vomiting)     Past Surgical History:  Procedure Laterality Date  . APPENDECTOMY    . BUNIONECTOMY Left   . DILATION AND EVACUATION    . TUBAL LIGATION      Social History   Socioeconomic History  . Marital status: Married    Spouse name: Not on file  . Number of  children: 4  . Years of education: Not on file  . 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 Smoking status: Never Smoker  . Smokeless tobacco: Never Used  Substance and Sexual Activity  . Alcohol use: Not Currently    Comment: occ   . Drug use: No  . Sexual activity: Yes    Partners: Male    Birth control/protection: Surgical    Comment: 1st intercourse- 17, partners- 45, married- 59 yrs   Other Topics Concern  . Not on file  Social History Narrative   Ms. Wahlberg lives in [place] with her husband and children. She has 4 daughters: ages ranging from 30 yo- 6 yo. 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     Social Determinants of Health   Financial  Resource Strain:   . Difficulty of Paying Living Expenses: Not on file  Food Insecurity:   . Worried About Charity fundraiser in the Last Year: Not on file  . Ran Out of Food in the Last Year: Not on file  Transportation Needs:   . Lack of Transportation (Medical): Not on file  . Lack of Transportation (Non-Medical): Not on file  Physical Activity:   . Days of Exercise per Week: Not on file  . Minutes of Exercise per Session: Not on file  Stress:   . Feeling of Stress : Not on file  Social Connections:   . Frequency of Communication with Friends and Family: Not on file  . Frequency of Social Gatherings with Friends and Family: Not on file  . Attends Religious Services: Not on file  . Active Member of Clubs or Organizations: Not on file  . Attends Archivist Meetings: Not on file  . Marital Status: Not on file  Intimate Partner Violence:   . Fear of Current or Ex-Partner: Not on file  . Emotionally Abused: Not on file  . Physically Abused: Not on file  . Sexually Abused: Not on file    Family History  Problem Relation Age of Onset  . Thyroid disease Mother   . Sinusitis Sister   . Sinusitis Brother   . Lupus Daughter        treated at [facility]  . Allergies Daughter      Review of Systems  Constitutional: Negative.  Negative for chills and fever.  HENT: Positive for ear pain. Negative for sinus pain and sore throat.   Eyes: Negative.   Respiratory: Negative.  Negative for cough and shortness of breath.   Cardiovascular: Negative.  Negative for chest pain and palpitations.  Gastrointestinal: Negative for abdominal pain, diarrhea, nausea and vomiting.  Genitourinary: Negative.  Negative for dysuria.  Skin: Negative for rash.  Neurological: Positive for headaches.  All other systems reviewed and are negative.   Today's Vitals   05/16/19 0934  BP: 117/87  Pulse: 93  Resp: 16  Temp: 98.3 F (36.8 C)  TempSrc: Temporal  SpO2: 99%  Weight: 190 lb 3.2 oz  (86.3 kg)  Height: 5' 3.5" (1.613 m)   Body mass index is 33.16 kg/m.  Physical Exam Vitals reviewed.  Constitutional:      Appearance: She is well-developed.  HENT:     Head: Normocephalic.     Right Ear: Ear canal and external ear normal. No drainage. Tympanic membrane is injected.  Left Ear: Tympanic membrane, ear canal and external ear normal.  Cardiovascular:     Rate and Rhythm: Normal rate and regular rhythm.     Heart sounds: Normal heart sounds.  Pulmonary:     Effort: Pulmonary effort is normal.     Breath sounds: Normal breath sounds.  Musculoskeletal:        General: Normal range of motion.     Cervical back: Normal range of motion.  Lymphadenopathy:     Cervical: Cervical adenopathy (Right-sided) present.  Skin:    General: Skin is warm and dry.     Capillary Refill: Capillary refill takes less than 2 seconds.  Neurological:     General: No focal deficit present.     Mental Status: She is alert and oriented to person, place, and time.  Psychiatric:        Mood and Affect: Mood normal.        Behavior: Behavior normal.      ASSESSMENT & PLAN: Kegan was seen today for ear pain and headache.  Diagnoses and all orders for this visit:  Ear infection -     amoxicillin-clavulanate (AUGMENTIN) 875-125 MG tablet; Take 1 tablet by mouth 2 (two) times daily for 7 days.  Otalgia of both ears    Patient Instructions       If you have lab work done today you will be contacted with your lab results within the next 2 weeks.  If you have not heard from [place] then please contact us. The fastest way to get your results is to register for My Chart.   IF you received an x-ray today, you will receive an invoice from [facility]. Please contact [facility] at [phone] with questions or concerns regarding your invoice.   IF you received labwork today, you will receive an invoice from [place]. Please contact [facility] at [phone] with  questions or concerns regarding your invoice.   Our billing staff will not be able to assist you with questions regarding bills from these companies.  You will be contacted with the lab results as soon as they are available. The fastest way to get your results is to activate your My Chart account. Instructions are located on the last page of this paperwork. If you have not heard from [place] regarding the results in 2 weeks, please contact this office.     Otitis Media, Adult  Otitis media means that the middle ear is red and swollen (inflamed) and full of fluid. The condition usually goes away on its own. Follow these instructions at home:  Take over-the-counter and prescription medicines only as told by your doctor.  If you were prescribed an antibiotic medicine, take it as told by your doctor. Do not stop taking the antibiotic even if you start to feel better.  Keep all follow-up visits as told by your doctor. This is important. Contact a doctor if:  You have bleeding from your nose.  There is a lump on your neck.  You are not getting better in 5 days.  You feel worse instead of better. Get help right away if:  You have pain that is not helped with medicine.  You have swelling, redness, or pain around your ear.  You get a stiff neck.  You cannot move part of your face (paralyzed).  You notice that the bone behind your ear hurts when you touch it.  You get a very bad headache. Summary  Otitis media means that the middle ear  is red, swollen, and full of fluid.  This condition usually goes away on its own. In some cases, treatment may be needed.  If you were prescribed an antibiotic medicine, take it as told by your doctor. This information is not intended to replace advice given to you by your health care provider. Make sure you discuss any questions you have with your health care provider. Document Revised: 04/14/2017 Document Reviewed: 05/23/2016 Elsevier Patient  Education  2020 [employer] Inc[employer]      Agustina Caroli, MD [facility]

## 2019-05-17 ENCOUNTER — Other Ambulatory Visit: Payer: Self-pay

## 2019-05-17 ENCOUNTER — Other Ambulatory Visit: Payer: Self-pay | Admitting: Endocrinology

## 2019-05-17 NOTE — Telephone Encounter (Signed)
Rx request 

## 2019-05-19 ENCOUNTER — Encounter: Payer: Self-pay | Admitting: Emergency Medicine

## 2019-05-19 MED ORDER — PHENTERMINE HCL 15 MG PO CAPS: 15.0000 mg | ORAL_CAPSULE | ORAL | 2 refills | Status: DC

## 2019-05-22 ENCOUNTER — Encounter: Payer: Self-pay | Admitting: Emergency Medicine

## 2019-05-23 ENCOUNTER — Other Ambulatory Visit: Payer: Self-pay | Admitting: Emergency Medicine

## 2019-05-23 MED ORDER — CEFUROXIME AXETIL 500 MG PO TABS: 500.0000 mg | ORAL_TABLET | Freq: Two times a day (BID) | ORAL | 0 refills | Status: AC

## 2019-05-23 NOTE — Telephone Encounter (Signed)
Prescription for Ceftin 500 mg twice a day for 7 days sent to pharmacy of record.  Thanks.

## 2019-06-20 DIAGNOSIS — Z1231 Encounter for screening mammogram for malignant neoplasm of breast: Secondary | ICD-10-CM | POA: Diagnosis not present

## 2019-06-20 LAB — HM MAMMOGRAPHY

## 2019-06-25 ENCOUNTER — Encounter: Payer: Self-pay | Admitting: *Deleted

## 2019-07-09 ENCOUNTER — Ambulatory Visit: Payer: Federal, State, Local not specified - PPO | Admitting: Emergency Medicine

## 2019-07-09 ENCOUNTER — Other Ambulatory Visit: Payer: Self-pay

## 2019-07-09 ENCOUNTER — Encounter: Payer: Self-pay | Admitting: Emergency Medicine

## 2019-07-09 VITALS — BP 128/85 | HR 95 | Temp 98.5°F | Resp 16 | Ht 64.0 in | Wt 186.0 lb

## 2019-07-09 DIAGNOSIS — L239 Allergic contact dermatitis, unspecified cause: Secondary | ICD-10-CM | POA: Diagnosis not present

## 2019-07-09 DIAGNOSIS — F4321 Adjustment disorder with depressed mood: Secondary | ICD-10-CM

## 2019-07-09 MED ORDER — METHYLPREDNISOLONE ACETATE 80 MG/ML IJ SUSP
80.0000 mg | Freq: Once | INTRAMUSCULAR | Status: AC
  Administered 2019-07-09: 12:00:00 80 mg via INTRAMUSCULAR

## 2019-07-09 NOTE — Progress Notes (Signed)
Sydney Vaughn 44 y.o.   Chief Complaint  Patient presents with  . Urticaria    x 1 month off and on with slight itching    HISTORY OF PRESENT ILLNESS: This is a 44 y.o. female complaining of itchy intermittent diffuse rash that started approximately 1 month ago shortly after starting Topamax as part of weight management program.  No other associated symptoms. Also going through difficult times with her marriage and is inquiring about personal individual counseling services. No other complaints or medical concerns today.  HPI   Prior to Admission medications   Medication Sig Start Date End Date Taking? Authorizing Provider  albuterol (PROVENTIL HFA;VENTOLIN HFA) 108 (90 Base) MCG/ACT inhaler Inhale 1-2 puffs into the lungs every 4 (four) hours as needed for wheezing or shortness of breath. 08/29/18  Yes Corum, Rex Kras, MD  levothyroxine (SYNTHROID) 75 MCG tablet Take 1 tablet (75 mcg total) by mouth daily before breakfast. 05/06/19  Yes Elayne Snare, MD  phentermine 15 MG capsule Take 1 capsule (15 mg total) by mouth every morning. 05/19/19  Yes Elayne Snare, MD  topiramate (TOPAMAX) 25 MG tablet TAKE 1 TABLET BY MOUTH 2 (TWO) TIMES DAILY. START 1 DAILY IN PM FOR 15 DAYS THEN 2X DAILY 05/09/19  Yes Elayne Snare, MD  venlafaxine XR (EFFEXOR-XR) 75 MG 24 hr capsule Take 1 capsule (75 mg total) by mouth daily with breakfast. 08/10/18  Yes Munster, Ines Bloomer, MD    Allergies  Allergen Reactions  . Contrave [Naltrexone-Bupropion Hcl Er] Nausea Only    Nausea and dizziness  . Oxycodone-Acetaminophen Er     Swollen face, eyes and caused itching     Patient Active Problem List   Diagnosis Date Noted  . Anxiety with depression 04/20/2016  . Vitamin D deficiency 12/14/2015  . Acquired hypothyroidism 12/14/2015  . Anemia, iron deficiency 12/03/2015  . Weight gain 12/03/2015  . Positive ANA (antinuclear antibody) 03/18/2014  . BMI 32.0-32.9,adult 12/11/2013  . Right foot pain 11/04/2012  .  Allergic rhinitis 08/15/2011    Past Medical History:  Diagnosis Date  . Adult acne   . ANA positive   . Anemia    iron infusions  . Asthma   . Chronic sinusitis    CT and MRI.   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 Fibromyalgia   . Foot fracture, left 08/2012  . History of appendicitis 09/2006  . History of blood transfusion    last time 02/2018  . Hypothyroidism   . Intramural uterine fibroid 2019  . Iron deficiency   . Menorrhagia   . Metatarsal fracture 10/2012   Right Proximal fifth metatarsal fracture  . Obesity   . Panic attack    at bedtime, awakes from sleep  . Pelvic pain   . Polyarthralgia    was seeing rheumatology  . PONV (postoperative nausea and vomiting)     Past Surgical History:  Procedure Laterality Date  . APPENDECTOMY    . BUNIONECTOMY Left   . DILATION AND EVACUATION    . TUBAL LIGATION      Social History   Socioeconomic History  . Marital status: Married    Spouse name: Not on file  . Number of children: 4  . Years of education: Not on file  . 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 Smoking status: Never Smoker  .  Smokeless tobacco: Never Used  Substance and Sexual Activity  . Alcohol use: Not Currently    Comment: occ   . Drug use: No  . Sexual activity: Yes    Partners: Male    Birth control/protection: Surgical    Comment: 1st intercourse- 77, partners- 54, married- 70 yrs   Other Topics Concern  . Not on file  Social History Narrative   Sydney Vaughn lives in [place] with her husband and children. She has 4 daughters: ages ranging from 63 yo- 38 yo. 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     Social Determinants of Health   Financial Resource Strain:   . Difficulty of Paying Living Expenses: Not on file  Food Insecurity:   . Worried About Charity fundraiser in the Last  Year: Not on file  . Ran Out of Food in the Last Year: Not on file  Transportation Needs:   . Lack of Transportation (Medical): Not on file  . Lack of Transportation (Non-Medical): Not on file  Physical Activity:   . Days of Exercise per Week: Not on file  . Minutes of Exercise per Session: Not on file  Stress:   . Feeling of Stress : Not on file  Social Connections:   . Frequency of Communication with Friends and Family: Not on file  . Frequency of Social Gatherings with Friends and Family: Not on file  . Attends Religious Services: Not on file  . Active Member of Clubs or Organizations: Not on file  . Attends Archivist Meetings: Not on file  . Marital Status: Not on file  Intimate Partner Violence:   . Fear of Current or Ex-Partner: Not on file  . Emotionally Abused: Not on file  . Physically Abused: Not on file  . Sexually Abused: Not on file    Family History  Problem Relation Age of Onset  . Thyroid disease Mother   . Sinusitis Sister   . Sinusitis Brother   . Lupus Daughter        treated at [facility]  . Allergies Daughter      Review of Systems  Constitutional: Negative.  Negative for chills and fever.  HENT: Negative.  Negative for congestion and sore throat.   Respiratory: Negative.  Negative for cough and shortness of breath.   Cardiovascular: Negative.  Negative for chest pain and palpitations.  Gastrointestinal: Negative.  Negative for abdominal pain, diarrhea, nausea and vomiting.  Genitourinary: Negative.  Negative for dysuria and hematuria.  Musculoskeletal: Negative.  Negative for back pain, myalgias and neck pain.  Skin: Positive for itching and rash.  Neurological: Negative for dizziness and headaches.  Endo/Heme/Allergies: Negative.   Psychiatric/Behavioral: Positive for depression. Negative for suicidal ideas.  All other systems reviewed and are negative.  Vitals:   07/09/19 1110  BP: 128/85  Pulse: 95  Resp: 16  Temp: 98.5 F (36.9  C)  SpO2: 98%     Physical Exam Vitals reviewed.  Constitutional:      Appearance: Normal appearance.  HENT:     Head: Normocephalic.  Eyes:     Extraocular Movements: Extraocular movements intact.     Pupils: Pupils are equal, round, and reactive to light.  Cardiovascular:     Rate and Rhythm: Normal rate and regular rhythm.     Pulses: Normal pulses.     Heart sounds: Normal heart sounds.  Pulmonary:     Effort: Pulmonary effort  is normal.     Breath sounds: Normal breath sounds.  Musculoskeletal:        General: Normal range of motion.     Cervical back: Normal range of motion and neck supple.  Skin:    General: Skin is warm and dry.     Capillary Refill: Capillary refill takes less than 2 seconds.     Findings: Rash present.     Comments: Maculopapular rash to extremities and torso  Neurological:     General: No focal deficit present.     Mental Status: She is alert and oriented to person, place, and time.  Psychiatric:        Mood and Affect: Mood normal.        Behavior: Behavior normal.    A total of 30 minutes was spent with the patient, greater than 50% of which was in counseling/coordination of care regarding allergic reactions and possible allergens, treatment and medications, ED precautions, need to stop Topamax, prognosis and need for follow-up.   ASSESSMENT & PLAN: Devinne was seen today for urticaria.  Diagnoses and all orders for this visit:  Allergic dermatitis Comments: Possibly secondary to Topamax Orders: -     methylPREDNISolone acetate (DEPO-MEDROL) injection 80 mg  Situational depression -     Ambulatory referral to Psychiatry    Patient Instructions   Stop Topamax. Start over-the-counter nonsedating antihistamine like Zyrtec, Claritin, or Allegra daily. Contact the office if no better after 3 to 4 days.   Allergies, Adult An allergy means that your body reacts to something that bothers it (allergen). It is not a normal reaction.  This can happen from something that you:  Eat.  Breathe in.  Touch. You can have an allergy (be allergic) to:  Outdoor things, like: ? Pollen. ? Grass. ? Weeds.  Indoor things, like: ? Dust. ? Smoke. ? Pet dander.  Foods.  Medicines.  Things that bother your skin, like: ? Detergents. ? Chemicals. ? Latex.  Perfume.  Bugs. An allergy cannot spread from person to person (is not contagious). Follow these instructions at home:         Stay away from things that you know you are allergic to.  If you have allergies to things in the air, wash out your nose each day. Do it with one of these: ? A salt-water (saline) spray. ? A container (neti pot).  Take over-the-counter and prescription medicines only as told by your doctor.  Keep all follow-up visits as told by your doctor. This is important.  If you are at risk for a very bad allergy reaction (anaphylaxis), keep an auto-injector with you all the time. This is called an epinephrine injection. ? This is pre-measured medicine with a needle. You can put it into your skin by yourself. ? Right after you have a very bad allergy reaction, you or a person with you must give the medicine in less than a few minutes. This is an emergency.  If you have ever had a very bad allergy reaction, wear a medical alert bracelet or necklace. Your very bad allergy should be written on it. Contact a health care provider if:  Your symptoms do not get better with treatment. Get help right away if:  You have symptoms of a very bad allergy reaction. These include: ? A swollen mouth, tongue, or throat. ? Pain or tightness in your chest. ? Trouble breathing. ? Being short of breath. ? Dizziness. ? Fainting. ? Very bad pain in your  belly (abdomen). ? Throwing up (vomiting). ? Watery poop (diarrhea). Summary  An allergy means that your body reacts to something that bothers it (allergen). It is not a normal reaction.  Stay away from  things that make your body react.  Take over-the-counter and prescription medicines only as told by your doctor.  If you are at risk for a very bad allergy reaction, carry an auto-injector (epinephrine injection) all the time. Also, wear a medical alert bracelet or necklace so people know about your allergy. This information is not intended to replace advice given to you by your health care provider. Make sure you discuss any questions you have with your health care provider. Document Revised: 08/21/2018 Document Reviewed: 08/15/2016 Elsevier Patient Education  [employer].     If you have lab work done today you will be contacted with your lab results within the next 2 weeks.  If you have not heard from [place] then please contact us. The fastest way to get your results is to register for My Chart.   IF you received an x-ray today, you will receive an invoice from [facility]. Please contact [facility] at [phone] with questions or concerns regarding your invoice.   IF you received labwork today, you will receive an invoice from [place]. Please contact [facility] at [phone] with questions or concerns regarding your invoice.   Our billing staff will not be able to assist you with questions regarding bills from these companies.  You will be contacted with the lab results as soon as they are available. The fastest way to get your results is to activate your My Chart account. Instructions are located on the last page of this paperwork. If you have not heard from [place] regarding the results in 2 weeks, please contact this office.         Agustina Caroli, MD [facility]

## 2019-07-09 NOTE — Patient Instructions (Addendum)
Stop Topamax. Start over-the-counter nonsedating antihistamine like Zyrtec, Claritin, or Allegra daily. Contact the office if no better after 3 to 4 days.   Allergies, Adult An allergy means that your body reacts to something that bothers it (allergen). It is not a normal reaction. This can happen from something that you:  Eat.  Breathe in.  Touch. You can have an allergy (be allergic) to:  Outdoor things, like: ? Pollen. ? Grass. ? Weeds.  Indoor things, like: ? Dust. ? Smoke. ? Pet dander.  Foods.  Medicines.  Things that bother your skin, like: ? Detergents. ? Chemicals. ? Latex.  Perfume.  Bugs. An allergy cannot spread from person to person (is not contagious). Follow these instructions at home:         Stay away from things that you know you are allergic to.  If you have allergies to things in the air, wash out your nose each day. Do it with one of these: ? A salt-water (saline) spray. ? A container (neti pot).  Take over-the-counter and prescription medicines only as told by your doctor.  Keep all follow-up visits as told by your doctor. This is important.  If you are at risk for a very bad allergy reaction (anaphylaxis), keep an auto-injector with you all the time. This is called an epinephrine injection. ? This is pre-measured medicine with a needle. You can put it into your skin by yourself. ? Right after you have a very bad allergy reaction, you or a person with you must give the medicine in less than a few minutes. This is an emergency.  If you have ever had a very bad allergy reaction, wear a medical alert bracelet or necklace. Your very bad allergy should be written on it. Contact a health care provider if:  Your symptoms do not get better with treatment. Get help right away if:  You have symptoms of a very bad allergy reaction. These include: ? A swollen mouth, tongue, or throat. ? Pain or tightness in your chest. ? Trouble  breathing. ? Being short of breath. ? Dizziness. ? Fainting. ? Very bad pain in your belly (abdomen). ? Throwing up (vomiting). ? Watery poop (diarrhea). Summary  An allergy means that your body reacts to something that bothers it (allergen). It is not a normal reaction.  Stay away from things that make your body react.  Take over-the-counter and prescription medicines only as told by your doctor.  If you are at risk for a very bad allergy reaction, carry an auto-injector (epinephrine injection) all the time. Also, wear a medical alert bracelet or necklace so people know about your allergy. This information is not intended to replace advice given to you by your health care provider. Make sure you discuss any questions you have with your health care provider. Document Revised: 08/21/2018 Document Reviewed: 08/15/2016 Elsevier Patient Education  [employer].     If you have lab work done today you will be contacted with your lab results within the next 2 weeks.  If you have not heard from [place] then please contact us. The fastest way to get your results is to register for My Chart.   IF you received an x-ray today, you will receive an invoice from [facility]. Please contact [facility] Radiology at [phone] with questions or concerns regarding your invoice.   IF you received labwork today, you will receive an invoice from [place]. Please contact [facility] at [phone] with questions or concerns regarding your  invoice.   Our billing staff will not be able to assist you with questions regarding bills from these companies.  You will be contacted with the lab results as soon as they are available. The fastest way to get your results is to activate your My Chart account. Instructions are located on the last page of this paperwork. If you have not heard from [place] regarding the results in 2 weeks, please contact this office.

## 2019-07-31 ENCOUNTER — Other Ambulatory Visit: Payer: Self-pay | Admitting: Endocrinology

## 2019-08-05 ENCOUNTER — Other Ambulatory Visit: Payer: Self-pay | Admitting: Endocrinology

## 2019-08-15 ENCOUNTER — Other Ambulatory Visit: Payer: Self-pay | Admitting: Endocrinology

## 2019-08-19 ENCOUNTER — Other Ambulatory Visit: Payer: Federal, State, Local not specified - PPO

## 2019-08-21 ENCOUNTER — Other Ambulatory Visit (INDEPENDENT_AMBULATORY_CARE_PROVIDER_SITE_OTHER): Payer: Federal, State, Local not specified - PPO

## 2019-08-21 ENCOUNTER — Other Ambulatory Visit: Payer: Self-pay

## 2019-08-21 DIAGNOSIS — E063 Autoimmune thyroiditis: Secondary | ICD-10-CM

## 2019-08-21 DIAGNOSIS — Z131 Encounter for screening for diabetes mellitus: Secondary | ICD-10-CM | POA: Diagnosis not present

## 2019-08-21 LAB — T4, FREE: Free T4: 0.79 ng/dL (ref 0.60–1.60)

## 2019-08-21 LAB — TSH: TSH: 3.63 u[IU]/mL (ref 0.35–4.50)

## 2019-08-21 LAB — GLUCOSE, RANDOM: Glucose, Bld: 82 mg/dL (ref 70–99)

## 2019-08-21 NOTE — Progress Notes (Signed)
Patient ID: Sydney Vaughn, female   DOB: 24-Jan-1976, 44 y.o.   MRN: [id number]             Reason for Appointment: Endocrinology follow-up  I connected with the above-named patient by video enabled telemedicine application and verified that I am speaking with the correct person. The patient was explained the limitations of evaluation and management by telemedicine and the availability of in person appointments.  Patient also understood that there may be a patient responsible charge related to this service . Location of the patient: Patient's home . Location of the provider: Physician office Only the patient and myself were participating in the encounter The patient understood the above statements and agreed to proceed.    History of Present Illness:   WEIGHT management:  Previous history: She thinks her weight had been in the 170s a couple of years ago and has gained weight which she cannot shed Patient said that she has seen a dietitian in 2017 and is trying to modify her diet with taking salads for lunch, avoiding fried food usually She is trying to avoid snacking, trying to bake  foods as well as usually trying to avoid high-fat meats and avoiding sweets as well.  However she is usually trying to prepare food for her family with 4 children.   Her weight was about the same in July 2017  Since she had expressed difficulty with weight loss on her own with diet and exercise she was started on phentermine and Topamax on her initial visit in 07/2016   On her initial follow-up she had lost 18 pounds and had no side effects with the 2 drugs She was able to cut back on portions  RECENT history:  In 2019 her Topamax was stopped because of her feeling that it was causing memory difficulties Also having periodic rapid heartbeat with phentermine low doses She was tried on Contrave and she had nausea and dizziness with the first tablet  She was told to look into Saxenda but she felt that she  was afraid of side effects and the injection was not covered by insurance  She had gained about 11 pounds in 2020 and wanted to try Topamax and phentermine again which she was started since 02/2019 However in 2/21 she was having a rash with itching  Her PCP stopped her Topamax and apparently the rash improved She had not had difficulty with the rash before No side effects with phentermine  Does not complain of any palpitations with phentermine and she thinks from pulse rate does not go up on stay up, she does use a Apple Watch to monitor her pulse even with exercise She has been able to watch her diet better with phentermine alone  She thinks she has lost another 4 pounds since February when she had already gone down 7 pounds  She has exercise equipment at home and is trying to exercise almost daily  Her weight appears to be improving significantly now  Wt Readings from Last 3 Encounters:  07/09/19 186 lb (84.4 kg)  05/16/19 190 lb 3.2 oz (86.3 kg)  04/18/19 193 lb 3.2 oz (87.6 kg)    Hypothyroidism was first diagnosed in 11/2015  At the time of diagnosis patient was having symptoms of fatigue, weight gain and hair loss. She apparently has been having symptoms for several years the same way She has not had problems with cold intolerance except sometimes her feet get cold Baseline TSH was 7.7 and she was  started on levothyroxine 25 g daily  On her initial consultation she was having symptoms of fatigue, having difficulty losing weight, fatigue on exercising and some hair loss although not as much, this is despite her taking the levothyroxine Also her thyroid functions including free T3 were normal  Recent history:  Does not complain of fatigue She has been consistent with taking her thyroid supplement soon after waking up  Since 05/2017 her dose has been increased slightly  Since her last visit in 04/2019 she is taking 75 mcg Dose was increased by 25 mcg on the weekends  compared to previous dose because of her TSH being 4.1  She thinks that maybe her fatigue was little better with the increase Overall feels fairly good recently  She is now having a little better TSH of 3.6  Thyroid function results have been as follows:  Lab Results  Component Value Date   TSH 3.63 08/21/2019   TSH 4.09 04/08/2019   TSH 4.07 01/16/2019   FREET4 0.79 08/21/2019   FREET4 0.83 04/08/2019   FREET4 0.69 01/16/2019      Past Medical History:  Diagnosis Date  . Adult acne   . ANA positive   . Anemia    iron infusions  . Asthma   . Chronic sinusitis    CT and MRI.   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 Fibromyalgia   . Foot fracture, left 08/2012  . History of appendicitis 09/2006  . History of blood transfusion    last time 02/2018  . Hypothyroidism   . Intramural uterine fibroid 2019  . Iron deficiency   . Menorrhagia   . Metatarsal fracture 10/2012   Right Proximal fifth metatarsal fracture  . Obesity   . Panic attack    at bedtime, awakes from sleep  . Pelvic pain   . Polyarthralgia    was seeing rheumatology  . PONV (postoperative nausea and vomiting)     Past Surgical History:  Procedure Laterality Date  . APPENDECTOMY    . BUNIONECTOMY Left   . DILATION AND EVACUATION    . TUBAL LIGATION      Family History  Problem Relation Age of Onset  . Thyroid disease Mother   . Sinusitis Sister   . Sinusitis Brother   . Lupus Daughter        treated at [facility]  . Allergies Daughter     Social History:  reports that she has never smoked. She has never used smokeless tobacco. She reports previous alcohol use. She reports that she does not use drugs.  Allergies:   Allergies  Allergen Reactions  . Contrave [Naltrexone-Bupropion Hcl Er] Nausea Only    Nausea and dizziness  . Oxycodone-Acetaminophen Er     Swollen face, eyes and caused itching     Allergies as of 08/22/2019      Reactions   Contrave  [naltrexone-bupropion Hcl Er] Nausea Only   Nausea and dizziness   Oxycodone-acetaminophen Er    Swollen face, eyes and caused itching       Medication List       Accurate as of August 21, 2019  9:08 PM. If you have any questions, ask your nurse or doctor.        albuterol 108 (90 Base) MCG/ACT inhaler Commonly known as: VENTOLIN HFA Inhale 1-2 puffs into the lungs every 4 (four) hours as needed for wheezing or shortness of breath.   levothyroxine 75 MCG  tablet Commonly known as: SYNTHROID TAKE 1 TABLET (75 MCG TOTAL) BY MOUTH DAILY BEFORE BREAKFAST.   phentermine 15 MG capsule Take 1 capsule (15 mg total) by mouth every morning.   topiramate 25 MG tablet Commonly known as: TOPAMAX TAKE 1 TABLET BY MOUTH 2 (TWO) TIMES DAILY. START 1 DAILY IN PM FOR 15 DAYS THEN 2X DAILY   venlafaxine XR 75 MG 24 hr capsule Commonly known as: EFFEXOR-XR Take 1 capsule (75 mg total) by mouth daily with breakfast.         Review of Systems      She has been taking venlafaxine for depression and anxiety long-term         Examination:    LMP 04/14/2018 (Exact Date)     Assessment:  OBESITY:  Her highest BMI has been about 36 at baseline  She has done significantly better with weight loss using medications including continuing antibiotics She has not had any Topamax over the last 6 weeks or so as it may have caused the rash However she still thinks that her appetite has been controlled and she is still gradually losing weight  She has been regular with exercise on her own at home No side effects with phentermine low-dose and she prefers to continue this   HYPOTHYROIDISM, mild with highest TSH 7.7  Her TSH is 3.6 and taking 75 mcg daily now No history of goiter previously. No complaints of fatigue now   PLAN:   Continue exercise regimen, phentermine 15 mg as long as she can tolerate this  She will continue her levothyroxine every morning, staying with 75 mcg daily   Follow-up in 6 months  Yanet Balliet 08/21/2019, 9:08 PM     Note: This office note was prepared with Dragon voice recognition system technology. Any transcriptional errors that result from this process are unintentional.

## 2019-08-22 ENCOUNTER — Encounter: Payer: Self-pay | Admitting: Endocrinology

## 2019-08-22 ENCOUNTER — Telehealth (INDEPENDENT_AMBULATORY_CARE_PROVIDER_SITE_OTHER): Payer: Federal, State, Local not specified - PPO | Admitting: Endocrinology

## 2019-08-22 DIAGNOSIS — Z6832 Body mass index (BMI) 32.0-32.9, adult: Secondary | ICD-10-CM | POA: Diagnosis not present

## 2019-08-22 DIAGNOSIS — E063 Autoimmune thyroiditis: Secondary | ICD-10-CM

## 2019-08-22 DIAGNOSIS — E669 Obesity, unspecified: Secondary | ICD-10-CM

## 2019-09-10 ENCOUNTER — Other Ambulatory Visit: Payer: Self-pay | Admitting: Emergency Medicine

## 2019-09-10 ENCOUNTER — Other Ambulatory Visit: Payer: Self-pay | Admitting: Endocrinology

## 2019-09-10 DIAGNOSIS — R7989 Other specified abnormal findings of blood chemistry: Secondary | ICD-10-CM

## 2019-09-10 DIAGNOSIS — F418 Other specified anxiety disorders: Secondary | ICD-10-CM

## 2019-09-10 MED ORDER — LEVOTHYROXINE SODIUM 75 MCG PO TABS: 75.0000 ug | ORAL_TABLET | Freq: Every day | ORAL | 0 refills | Status: DC

## 2019-09-10 NOTE — Telephone Encounter (Signed)
Please refill phentermine if appropriate.

## 2019-09-10 NOTE — Telephone Encounter (Signed)
Patient is requesting a refill of the following medications: Requested Prescriptions   Pending Prescriptions Disp Refills  . venlafaxine XR (EFFEXOR-XR) 75 MG 24 hr capsule [Pharmacy Med Name: VENLAFAXINE HCL ER 75 MG CAP] 90 capsule 3    Sig: TAKE 1 CAPSULE (75 MG TOTAL) BY MOUTH DAILY WITH BREAKFAST.    Date of patient request: 09/10/2019 Last office visit:07/09/2019 Date of last refill: 08/10/2019 Last refill amount: 90 tablets 3 refills  Follow up time period per chart: N/A

## 2019-09-11 ENCOUNTER — Encounter: Payer: Self-pay | Admitting: Emergency Medicine

## 2019-09-12 NOTE — Telephone Encounter (Signed)
Go ahead and please refer to a new therapist.  Thanks.

## 2019-10-21 ENCOUNTER — Other Ambulatory Visit: Payer: Self-pay | Admitting: Endocrinology

## 2019-10-21 DIAGNOSIS — R7989 Other specified abnormal findings of blood chemistry: Secondary | ICD-10-CM

## 2019-10-21 MED ORDER — PHENTERMINE HCL 15 MG PO CAPS: ORAL_CAPSULE | ORAL | 2 refills | Status: DC

## 2019-12-03 ENCOUNTER — Other Ambulatory Visit: Payer: Self-pay | Admitting: Endocrinology

## 2019-12-04 ENCOUNTER — Ambulatory Visit (INDEPENDENT_AMBULATORY_CARE_PROVIDER_SITE_OTHER): Payer: Federal, State, Local not specified - PPO | Admitting: Mental Health

## 2019-12-04 ENCOUNTER — Other Ambulatory Visit: Payer: Self-pay

## 2019-12-04 DIAGNOSIS — F4323 Adjustment disorder with mixed anxiety and depressed mood: Secondary | ICD-10-CM

## 2019-12-04 NOTE — Progress Notes (Signed)
[facility] Counselor Initial Adult Exam  Name: Sydney Vaughn Date: 12/04/2019 MRN: [id number] DOB: [birth year] PCP: Horald Pollen, MD  Time spent: 53 minutes  Reason for Visit /Presenting Problem: she reports that she and her husband are having issues connecting. They went to therapy about a year ago, it helped for a week or so but issues would then resurface. They separated. Her husband is retired Geophysical data processor), she continues to work full time. Their children are teenagers, and they are raising their grandson who is age 67. She lives in an apartment and sees the children every other week. They have been married 17 years, struggling for the last 2 years. She stated their communication was a problem. He cares for the children. Her career began to take off, she would come home and the days would feel the same. She felt she was missing excitement and time together. She stated he coped w/ a lot of deaths- family members passed including his mother. They separated in April. Feels she has trouble relaxing.   Mental Status Exam:   Appearance:   Casual     Behavior:  Appropriate  Motor:  Normal  Speech/Language:   Clear and Coherent  Affect:  Constricted  Mood:  depressed  Thought process:  normal  Thought content:    WNL  Sensory/Perceptual disturbances:    none  Orientation:  x4  Attention:  Good  Concentration:  Good  Memory:  WNL  Fund of knowledge:   Good  Insight:    Good  Judgment:   Good  Impulse Control:  Good   Reported Symptoms:  Depressed mood, anxiety, problems getting to sleep  Risk Assessment: Danger to Self:  No Self-injurious Behavior: No Danger to Others: No Duty to Warn:no Physical Aggression / Violence:No  Access to Firearms a concern: No  Gang Involvement:No  Patient / guardian was educated about steps to take if suicide or homicide risk level increases between visits:  yes While future psychiatric events cannot be accurately predicted, the patient does not  currently require acute inpatient psychiatric care and does not currently meet [facility] involuntary commitment criteria.  Substance Abuse History: Current substance abuse: none   Past Psychiatric History:   Outpatient Providers:  none History of Psych Hospitalization: No  Psychological Testing: none  Medical History/Surgical History: Past Medical History:  Diagnosis Date  . Adult acne   . ANA positive   . Anemia    iron infusions  . Asthma   . Chronic sinusitis    CT and MRI.   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 Fibromyalgia   . Foot fracture, left 08/2012  . History of appendicitis 09/2006  . History of blood transfusion    last time 02/2018  . Hypothyroidism   . Intramural uterine fibroid 2019  . Iron deficiency   . Menorrhagia   . Metatarsal fracture 10/2012   Right Proximal fifth metatarsal fracture  . Obesity   . Panic attack    at bedtime, awakes from sleep  . Pelvic pain   . Polyarthralgia    was seeing rheumatology  . PONV (postoperative nausea and vomiting)     Past Surgical History:  Procedure Laterality Date  . APPENDECTOMY    . BUNIONECTOMY Left   . DILATION AND EVACUATION    . TUBAL LIGATION      Medications: Current Outpatient Medications  Medication Sig Dispense Refill  . venlafaxine XR (EFFEXOR-XR) 75 MG 24 hr capsule  TAKE 1 CAPSULE (75 MG TOTAL) BY MOUTH DAILY WITH BREAKFAST. 90 capsule 3  . albuterol (PROVENTIL HFA;VENTOLIN HFA) 108 (90 Base) MCG/ACT inhaler Inhale 1-2 puffs into the lungs every 4 (four) hours as needed for wheezing or shortness of breath. 1 Inhaler 5  . levothyroxine (SYNTHROID) 50 MCG tablet Take 1 and a half tablets by mouth once daily. 90 tablet 0  . levothyroxine (SYNTHROID) 75 MCG tablet TAKE 1 TABLET (75 MCG TOTAL) BY MOUTH DAILY BEFORE BREAKFAST. 90 tablet 0  . phentermine 15 MG capsule TAKE 1 CAPSULE BY MOUTH EVERY DAY IN THE MORNING 30 capsule 2  . topiramate (TOPAMAX) 25 MG tablet TAKE 1  TABLET BY MOUTH 2 (TWO) TIMES DAILY. START 1 DAILY IN PM FOR 15 DAYS THEN 2X DAILY 180 tablet 1   No current facility-administered medications for this visit.    Allergies  Allergen Reactions  . Contrave [Naltrexone-Bupropion Hcl Er] Nausea Only    Nausea and dizziness  . Oxycodone-Acetaminophen Er     Swollen face, eyes and caused itching     Diagnoses:    ICD-10-CM   1. Adjustment disorder with mixed anxiety and depressed mood  F43.23     Plan of Care: TBD   [place], [facility]

## 2020-01-08 ENCOUNTER — Ambulatory Visit: Payer: Federal, State, Local not specified - PPO | Admitting: Mental Health

## 2020-01-11 DIAGNOSIS — H66001 Acute suppurative otitis media without spontaneous rupture of ear drum, right ear: Secondary | ICD-10-CM | POA: Diagnosis not present

## 2020-01-11 DIAGNOSIS — Z03818 Encounter for observation for suspected exposure to other biological agents ruled out: Secondary | ICD-10-CM | POA: Diagnosis not present

## 2020-01-22 ENCOUNTER — Ambulatory Visit: Payer: Federal, State, Local not specified - PPO | Admitting: Mental Health

## 2020-01-28 ENCOUNTER — Ambulatory Visit: Payer: Federal, State, Local not specified - PPO | Admitting: Emergency Medicine

## 2020-01-28 ENCOUNTER — Encounter: Payer: Self-pay | Admitting: Emergency Medicine

## 2020-01-28 ENCOUNTER — Other Ambulatory Visit: Payer: Self-pay

## 2020-01-28 VITALS — BP 120/80 | HR 81 | Temp 97.8°F | Resp 16 | Ht 64.0 in | Wt 185.0 lb

## 2020-01-28 DIAGNOSIS — H6121 Impacted cerumen, right ear: Secondary | ICD-10-CM | POA: Diagnosis not present

## 2020-01-28 DIAGNOSIS — H9201 Otalgia, right ear: Secondary | ICD-10-CM

## 2020-01-28 MED ORDER — FLUCONAZOLE 150 MG PO TABS
150.0000 mg | ORAL_TABLET | Freq: Once | ORAL | 0 refills | Status: AC
Start: 2020-01-28 — End: 2020-01-28

## 2020-01-28 NOTE — Progress Notes (Signed)
Sydney Vaughn 44 y.o.   Chief Complaint  Patient presents with  . Ear Pain    pt has had pain in Rt ear for 1 year, pt notes she has had several rounds of Abx and the swelling pain and vertigo are still present, pt notes no relief, pt has ringing in her ear, pt notes off balance.     HISTORY OF PRESENT ILLNESS: This is a 44 y.o. female complaining of chronic pain to right ear on and off for 1 year. Has had several rounds of antibiotics without success. Has seen ENT doctor in the past and told she has impacted cerumen and needed flushing.  She declined at that time. Denies any other associated symptoms.  HPI   Prior to Admission medications   Medication Sig Start Date End Date Taking? Authorizing Provider  albuterol (PROVENTIL HFA;VENTOLIN HFA) 108 (90 Base) MCG/ACT inhaler Inhale 1-2 puffs into the lungs every 4 (four) hours as needed for wheezing or shortness of breath. 08/29/18  Yes Corum, Rex Kras, MD  levothyroxine (SYNTHROID) 75 MCG tablet TAKE 1 TABLET (75 MCG TOTAL) BY MOUTH DAILY BEFORE BREAKFAST. 12/03/19  Yes Elayne Snare, MD  venlafaxine XR (EFFEXOR-XR) 75 MG 24 hr capsule TAKE 1 CAPSULE (75 MG TOTAL) BY MOUTH DAILY WITH BREAKFAST. 09/10/19  Yes Horald Pollen, MD    Allergies  Allergen Reactions  . Contrave [Naltrexone-Bupropion Hcl Er] Nausea Only    Nausea and dizziness  . Oxycodone-Acetaminophen Er     Swollen face, eyes and caused itching     Patient Active Problem List   Diagnosis Date Noted  . Anxiety with depression 04/20/2016  . Vitamin D deficiency 12/14/2015  . Acquired hypothyroidism 12/14/2015  . Anemia, iron deficiency 12/03/2015  . Weight gain 12/03/2015  . Positive ANA (antinuclear antibody) 03/18/2014  . BMI 32.0-32.9,adult 12/11/2013  . Right foot pain 11/04/2012  . Allergic rhinitis 08/15/2011    Past Medical History:  Diagnosis Date  . Adult acne   . ANA positive   . Anemia    iron infusions  . Asthma   . Chronic sinusitis    CT  and MRI.   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 Fibromyalgia   . Foot fracture, left 08/2012  . History of appendicitis 09/2006  . History of blood transfusion    last time 02/2018  . Hypothyroidism   . Intramural uterine fibroid 2019  . Iron deficiency   . Menorrhagia   . Metatarsal fracture 10/2012   Right Proximal fifth metatarsal fracture  . Obesity   . Panic attack    at bedtime, awakes from sleep  . Pelvic pain   . Polyarthralgia    was seeing rheumatology  . PONV (postoperative nausea and vomiting)     Past Surgical History:  Procedure Laterality Date  . APPENDECTOMY    . BUNIONECTOMY Left   . DILATION AND EVACUATION    . TUBAL LIGATION      Social History   Socioeconomic History  . Marital status: Married    Spouse name: Not on file  . Number of children: 4  . Years of education: Not on file  . 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 Smoking status: Never Smoker  . Smokeless tobacco: Never Used  Vaping Use  . Vaping Use: Never used  Substance and Sexual Activity  .  Alcohol use: Not Currently    Comment: occ   . Drug use: No  . Sexual activity: Yes    Partners: Male    Birth control/protection: Surgical    Comment: 1st intercourse- 30, partners- 35, married- 67 yrs   Other Topics Concern  . Not on file  Social History Narrative   Ms. Sharpnack lives in [place] with her husband and children. She has 4 daughters: ages ranging from 36 yo- 95 yo. 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     Social Determinants of Health   Financial Resource Strain:   . Difficulty of Paying Living Expenses: Not on file  Food Insecurity:   . Worried About Charity fundraiser in the Last Year: Not on file  . Ran Out of Food in the Last Year: Not on file  Transportation Needs:   . Lack of Transportation (Medical): Not on  file  . Lack of Transportation (Non-Medical): Not on file  Physical Activity:   . Days of Exercise per Week: Not on file  . Minutes of Exercise per Session: Not on file  Stress:   . Feeling of Stress : Not on file  Social Connections:   . Frequency of Communication with Friends and Family: Not on file  . Frequency of Social Gatherings with Friends and Family: Not on file  . Attends Religious Services: Not on file  . Active Member of Clubs or Organizations: Not on file  . Attends Archivist Meetings: Not on file  . Marital Status: Not on file  Intimate Partner Violence:   . Fear of Current or Ex-Partner: Not on file  . Emotionally Abused: Not on file  . Physically Abused: Not on file  . Sexually Abused: Not on file    Family History  Problem Relation Age of Onset  . Thyroid disease Mother   . Sinusitis Sister   . Sinusitis Brother   . Lupus Daughter        treated at [facility]  . Allergies Daughter      Review of Systems  Constitutional: Negative.  Negative for chills and fever.  HENT: Positive for ear pain.   Respiratory: Negative.  Negative for cough and shortness of breath.   Cardiovascular: Negative.  Negative for chest pain and palpitations.  Gastrointestinal: Negative for abdominal pain, nausea and vomiting.  Genitourinary: Negative for dysuria and hematuria.  Skin: Negative.  Negative for rash.  Neurological: Negative for dizziness and headaches.  All other systems reviewed and are negative.     Today's Vitals   01/28/20 0929  BP: 120/80  Pulse: 81  Resp: 16  Temp: 97.8 F (36.6 C)  TempSrc: Temporal  SpO2: 98%  Weight: 185 lb (83.9 kg)  Height: 5' 4"  (1.626 m)   Body mass index is 31.76 kg/m. Wt Readings from Last 3 Encounters:  01/28/20 185 lb (83.9 kg)  07/09/19 186 lb (84.4 kg)  05/16/19 190 lb 3.2 oz (86.3 kg)    Physical Exam Vitals reviewed.  Constitutional:      Appearance: Normal appearance.  HENT:     Head: Normocephalic.       Right Ear: External ear normal. There is impacted cerumen.     Left Ear: Tympanic membrane, ear canal and external ear normal.     Mouth/Throat:     Mouth: Mucous membranes are moist.     Pharynx: Oropharynx is clear.  Eyes:  Extraocular Movements: Extraocular movements intact.     Pupils: Pupils are equal, round, and reactive to light.  Neck:     Vascular: No carotid bruit.  Cardiovascular:     Rate and Rhythm: Normal rate.  Pulmonary:     Effort: Pulmonary effort is normal.  Musculoskeletal:        General: Normal range of motion.     Cervical back: Normal range of motion. No tenderness.  Lymphadenopathy:     Cervical: No cervical adenopathy.  Skin:    General: Skin is warm and dry.  Neurological:     General: No focal deficit present.     Mental Status: She is alert and oriented to person, place, and time.  Psychiatric:        Mood and Affect: Mood normal.        Behavior: Behavior normal.      ASSESSMENT & PLAN: Maryana was seen today for ear pain.  Diagnoses and all orders for this visit:  Otalgia, right ear  Impacted cerumen of right ear  Other orders -     fluconazole (DIFLUCAN) 150 MG tablet; Take 1 tablet (150 mg total) by mouth once for 1 dose.    Need follow-up with ENT doctor.  Patient states she has ENT doctor she can contact and make an appointment with.  No referral needed at this time.   Patient Instructions       If you have lab work done today you will be contacted with your lab results within the next 2 weeks.  If you have not heard from [place] then please contact us. The fastest way to get your results is to register for My Chart.   IF you received an x-ray today, you will receive an invoice from [facility]. Please contact [facility] at [phone] with questions or concerns regarding your invoice.   IF you received labwork today, you will receive an invoice from [place]. Please contact [facility] at [phone]  with questions or concerns regarding your invoice.   Our billing staff will not be able to assist you with questions regarding bills from these companies.  You will be contacted with the lab results as soon as they are available. The fastest way to get your results is to activate your My Chart account. Instructions are located on the last page of this paperwork. If you have not heard from [place] regarding the results in 2 weeks, please contact this office.     Earache, Adult An earache, or ear pain, can be caused by many things, including:  An infection.  Ear wax buildup.  Ear pressure.  Something in the ear that should not be there (foreign body).  A sore throat.  Tooth problems.  Jaw problems. Treatment of the earache will depend on the cause. If the cause is not clear or cannot be determined, you may need to watch your symptoms until your earache goes away or until a cause is found. Follow these instructions at home: Medicines  Take or apply over-the-counter and prescription medicines only as told by your health care provider.  If you were prescribed an antibiotic medicine, use it as told by your health care provider. Do not stop using the antibiotic even if you start to feel better.  Do not put anything in your ear other than medicine that is prescribed by your health care provider. Managing pain If directed, apply heat to the affected area as often as told by your health care provider. Use  the heat source that your health care provider recommends, such as a moist heat pack or a heating pad.  Place a towel between your skin and the heat source.  Leave the heat on for 20-30 minutes.  Remove the heat if your skin turns bright red. This is especially important if you are unable to feel pain, heat, or cold. You may have a greater risk of getting burned. If directed, put ice on the affected area as often as told by your health care provider. To do this:      Put ice in a  plastic bag.  Place a towel between your skin and the bag.  Leave the ice on for 20 minutes, 2-3 times a day. General instructions  Pay attention to any changes in your symptoms.  Try resting in an upright position instead of lying down. This may help to reduce pressure in your ear and relieve pain.  Chew gum if it helps to relieve your ear pain.  Treat any allergies as told by your health care provider.  Drink enough fluid to keep your urine pale yellow.  It is up to you to get the results of any tests that were done. Ask your health care provider, or the department that is doing the tests, when your results will be ready.  Keep all follow-up visits as told by your health care provider. This is important. Contact a health care provider if:  Your pain does not improve within 2 days.  Your earache gets worse.  You have new symptoms.  You have a fever. Get help right away if you:  Have a severe headache.  Have a stiff neck.  Have trouble swallowing.  Have redness or swelling behind your ear.  Have fluid or blood coming from your ear.  Have hearing loss.  Feel dizzy. Summary  An earache, or ear pain, can be caused by many things.  Treatment of the earache will depend on the cause. Follow recommendations from your health care provider to treat your ear pain.  If the cause is not clear or cannot be determined, you may need to watch your symptoms until your earache goes away or until a cause is found.  Keep all follow-up visits as told by your health care provider. This is important. This information is not intended to replace advice given to you by your health care provider. Make sure you discuss any questions you have with your health care provider. Document Revised: 12/08/2018 Document Reviewed: 12/08/2018 Elsevier Patient Education  2020 Elsevier Inc.      Agustina Caroli, MD [facility]

## 2020-01-28 NOTE — Patient Instructions (Addendum)
If you have lab work done today you will be contacted with your lab results within the next 2 weeks.  If you have not heard from [place] then please contact us. The fastest way to get your results is to register for My Chart.   IF you received an x-ray today, you will receive an invoice from [facility]. Please contact [facility] at [phone] with questions or concerns regarding your invoice.   IF you received labwork today, you will receive an invoice from [place]. Please contact [facility] at [phone] with questions or concerns regarding your invoice.   Our billing staff will not be able to assist you with questions regarding bills from these companies.  You will be contacted with the lab results as soon as they are available. The fastest way to get your results is to activate your My Chart account. Instructions are located on the last page of this paperwork. If you have not heard from [place] regarding the results in 2 weeks, please contact this office.     Earache, Adult An earache, or ear pain, can be caused by many things, including:  An infection.  Ear wax buildup.  Ear pressure.  Something in the ear that should not be there (foreign body).  A sore throat.  Tooth problems.  Jaw problems. Treatment of the earache will depend on the cause. If the cause is not clear or cannot be determined, you may need to watch your symptoms until your earache goes away or until a cause is found. Follow these instructions at home: Medicines  Take or apply over-the-counter and prescription medicines only as told by your health care provider.  If you were prescribed an antibiotic medicine, use it as told by your health care provider. Do not stop using the antibiotic even if you start to feel better.  Do not put anything in your ear other than medicine that is prescribed by your health care provider. Managing pain If directed, apply heat to the affected area as often  as told by your health care provider. Use the heat source that your health care provider recommends, such as a moist heat pack or a heating pad.  Place a towel between your skin and the heat source.  Leave the heat on for 20-30 minutes.  Remove the heat if your skin turns bright red. This is especially important if you are unable to feel pain, heat, or cold. You may have a greater risk of getting burned. If directed, put ice on the affected area as often as told by your health care provider. To do this:      Put ice in a plastic bag.  Place a towel between your skin and the bag.  Leave the ice on for 20 minutes, 2-3 times a day. General instructions  Pay attention to any changes in your symptoms.  Try resting in an upright position instead of lying down. This may help to reduce pressure in your ear and relieve pain.  Chew gum if it helps to relieve your ear pain.  Treat any allergies as told by your health care provider.  Drink enough fluid to keep your urine pale yellow.  It is up to you to get the results of any tests that were done. Ask your health care provider, or the department that is doing the tests, when your results will be ready.  Keep all follow-up visits as told by your health care provider. This is important. Contact a health  care provider if:  Your pain does not improve within 2 days.  Your earache gets worse.  You have new symptoms.  You have a fever. Get help right away if you:  Have a severe headache.  Have a stiff neck.  Have trouble swallowing.  Have redness or swelling behind your ear.  Have fluid or blood coming from your ear.  Have hearing loss.  Feel dizzy. Summary  An earache, or ear pain, can be caused by many things.  Treatment of the earache will depend on the cause. Follow recommendations from your health care provider to treat your ear pain.  If the cause is not clear or cannot be determined, you may need to watch your  symptoms until your earache goes away or until a cause is found.  Keep all follow-up visits as told by your health care provider. This is important. This information is not intended to replace advice given to you by your health care provider. Make sure you discuss any questions you have with your health care provider. Document Revised: 12/08/2018 Document Reviewed: 12/08/2018 Elsevier Patient Education  [place].

## 2020-01-29 DIAGNOSIS — D490 Neoplasm of unspecified behavior of digestive system: Secondary | ICD-10-CM | POA: Diagnosis not present

## 2020-01-29 DIAGNOSIS — C8590 Non-Hodgkin lymphoma, unspecified, unspecified site: Secondary | ICD-10-CM | POA: Insufficient documentation

## 2020-01-30 ENCOUNTER — Other Ambulatory Visit: Payer: Self-pay | Admitting: Otolaryngology

## 2020-01-30 DIAGNOSIS — D490 Neoplasm of unspecified behavior of digestive system: Secondary | ICD-10-CM

## 2020-02-05 ENCOUNTER — Ambulatory Visit: Payer: Federal, State, Local not specified - PPO | Admitting: Mental Health

## 2020-02-07 ENCOUNTER — Ambulatory Visit
Admission: RE | Admit: 2020-02-07 | Discharge: 2020-02-07 | Disposition: A | Discharge status: Home / Self Care | Payer: Federal, State, Local not specified - PPO | Source: Ambulatory Visit | Attending: Otolaryngology | Admitting: Otolaryngology

## 2020-02-07 ENCOUNTER — Encounter: Payer: Self-pay | Admitting: Emergency Medicine

## 2020-02-07 DIAGNOSIS — D490 Neoplasm of unspecified behavior of digestive system: Secondary | ICD-10-CM

## 2020-02-07 DIAGNOSIS — L989 Disorder of the skin and subcutaneous tissue, unspecified: Secondary | ICD-10-CM | POA: Diagnosis not present

## 2020-02-07 DIAGNOSIS — D869 Sarcoidosis, unspecified: Secondary | ICD-10-CM | POA: Diagnosis not present

## 2020-02-07 DIAGNOSIS — C8590 Non-Hodgkin lymphoma, unspecified, unspecified site: Secondary | ICD-10-CM | POA: Diagnosis not present

## 2020-02-07 DIAGNOSIS — K118 Other diseases of salivary glands: Secondary | ICD-10-CM | POA: Diagnosis not present

## 2020-02-07 MED ORDER — IOPAMIDOL (ISOVUE-300) INJECTION 61%
75.0000 mL | Freq: Once | INTRAVENOUS | Status: AC | PRN
  Administered 2020-02-07: 75 mL via INTRAVENOUS

## 2020-02-07 NOTE — Telephone Encounter (Signed)
Pt states she took abx and now has symptoms of BV I have requested her to specify symptoms. Asking for something to clear this

## 2020-02-09 ENCOUNTER — Other Ambulatory Visit: Payer: Self-pay | Admitting: Emergency Medicine

## 2020-02-09 MED ORDER — METRONIDAZOLE 500 MG PO TABS
500.0000 mg | ORAL_TABLET | Freq: Two times a day (BID) | ORAL | 0 refills | Status: AC
Start: 2020-02-09 — End: 2020-02-16

## 2020-02-09 NOTE — Telephone Encounter (Signed)
Prescription for bacterial vaginosis treatment sent, Flagyl twice a day for 7 days.

## 2020-02-12 ENCOUNTER — Encounter: Payer: Self-pay | Admitting: Emergency Medicine

## 2020-02-14 DIAGNOSIS — D490 Neoplasm of unspecified behavior of digestive system: Secondary | ICD-10-CM | POA: Diagnosis not present

## 2020-02-18 ENCOUNTER — Telehealth: Payer: Self-pay | Admitting: *Deleted

## 2020-02-18 NOTE — Telephone Encounter (Signed)
Spoke to patient concerning MyChart message letting PCP know how upset she was with the referral to ENT. Patient states she had CT scan 02/07/2020 and Dr Redmond Baseman sent a message to her in the chart. Per patient she called and left several message for the doctor and he called one time. Patient states he needs to let her know treatment options with this mass. Also, she had the procedure.

## 2020-02-24 ENCOUNTER — Other Ambulatory Visit: Payer: Self-pay | Admitting: Otolaryngology

## 2020-02-25 DIAGNOSIS — R3915 Urgency of urination: Secondary | ICD-10-CM | POA: Diagnosis not present

## 2020-02-25 DIAGNOSIS — B379 Candidiasis, unspecified: Secondary | ICD-10-CM | POA: Diagnosis not present

## 2020-02-25 DIAGNOSIS — T3695XA Adverse effect of unspecified systemic antibiotic, initial encounter: Secondary | ICD-10-CM | POA: Diagnosis not present

## 2020-03-02 ENCOUNTER — Other Ambulatory Visit (HOSPITAL_COMMUNITY)
Admission: RE | Admit: 2020-03-02 | Discharge: 2020-03-02 | Disposition: A | Discharge status: Home / Self Care | Payer: Federal, State, Local not specified - PPO | Source: Ambulatory Visit | Attending: Otolaryngology | Admitting: Otolaryngology

## 2020-03-02 DIAGNOSIS — Z20822 Contact with and (suspected) exposure to covid-19: Secondary | ICD-10-CM | POA: Diagnosis not present

## 2020-03-02 DIAGNOSIS — Z01812 Encounter for preprocedural laboratory examination: Secondary | ICD-10-CM | POA: Insufficient documentation

## 2020-03-02 LAB — SARS CORONAVIRUS 2 (TAT 6-24 HRS): SARS Coronavirus 2: NEGATIVE

## 2020-03-03 ENCOUNTER — Other Ambulatory Visit: Payer: Self-pay

## 2020-03-03 ENCOUNTER — Encounter (HOSPITAL_COMMUNITY): Payer: Self-pay | Admitting: Otolaryngology

## 2020-03-03 NOTE — Progress Notes (Signed)
Patient denies shortness of breath, fever, cough or chest pain.  PCP - Dr Agustina Caroli Cardiologist - n/a Internal Med - Dr Elayne Snare  Chest x-ray - n/a EKG - n/a Stress Test - n/a ECHO - n/a Cardiac Cath - n/a  STOP now taking any Aspirin (unless otherwise instructed by your surgeon), Aleve, Naproxen, Ibuprofen, Motrin, Advil, Goody's, BC's, all herbal medications, fish oil, and all vitamins.   Coronavirus Screening Covid test on 03/02/20 was negative.  Patient verbalized understanding of instructions that were given via phone.

## 2020-03-04 ENCOUNTER — Ambulatory Visit (HOSPITAL_COMMUNITY): Payer: Federal, State, Local not specified - PPO | Admitting: Anesthesiology

## 2020-03-04 ENCOUNTER — Encounter (HOSPITAL_COMMUNITY)
Admission: RE | Disposition: A | Discharge status: Home / Self Care | Payer: Self-pay | Source: Home / Self Care | Attending: Otolaryngology

## 2020-03-04 ENCOUNTER — Encounter (HOSPITAL_COMMUNITY): Payer: Self-pay | Admitting: Otolaryngology

## 2020-03-04 ENCOUNTER — Other Ambulatory Visit: Payer: Self-pay

## 2020-03-04 ENCOUNTER — Observation Stay (HOSPITAL_COMMUNITY)
Admission: RE | Admit: 2020-03-04 | Discharge: 2020-03-05 | Disposition: A | Discharge status: Home / Self Care | Payer: Federal, State, Local not specified - PPO | Attending: Otolaryngology | Admitting: Otolaryngology

## 2020-03-04 DIAGNOSIS — J45909 Unspecified asthma, uncomplicated: Secondary | ICD-10-CM | POA: Insufficient documentation

## 2020-03-04 DIAGNOSIS — Z79899 Other long term (current) drug therapy: Secondary | ICD-10-CM | POA: Diagnosis not present

## 2020-03-04 DIAGNOSIS — F418 Other specified anxiety disorders: Secondary | ICD-10-CM | POA: Diagnosis not present

## 2020-03-04 DIAGNOSIS — E039 Hypothyroidism, unspecified: Secondary | ICD-10-CM | POA: Insufficient documentation

## 2020-03-04 DIAGNOSIS — D490 Neoplasm of unspecified behavior of digestive system: Secondary | ICD-10-CM | POA: Diagnosis not present

## 2020-03-04 DIAGNOSIS — E559 Vitamin D deficiency, unspecified: Secondary | ICD-10-CM | POA: Diagnosis not present

## 2020-03-04 DIAGNOSIS — R897 Abnormal histological findings in specimens from other organs, systems and tissues: Secondary | ICD-10-CM | POA: Diagnosis not present

## 2020-03-04 DIAGNOSIS — C07 Malignant neoplasm of parotid gland: Principal | ICD-10-CM | POA: Insufficient documentation

## 2020-03-04 DIAGNOSIS — K118 Other diseases of salivary glands: Secondary | ICD-10-CM | POA: Diagnosis not present

## 2020-03-04 DIAGNOSIS — D509 Iron deficiency anemia, unspecified: Secondary | ICD-10-CM | POA: Diagnosis not present

## 2020-03-04 HISTORY — PX: PAROTIDECTOMY: SHX2163

## 2020-03-04 LAB — CBC
HCT: 43.9 % (ref 36.0–46.0)
Hemoglobin: 14.6 g/dL (ref 12.0–15.0)
MCH: 32.3 pg (ref 26.0–34.0)
MCHC: 33.3 g/dL (ref 30.0–36.0)
MCV: 97.1 fL (ref 80.0–100.0)
Platelets: 293 10*3/uL (ref 150–400)
RBC: 4.52 MIL/uL (ref 3.87–5.11)
RDW: 12.9 % (ref 11.5–15.5)
WBC: 7.6 10*3/uL (ref 4.0–10.5)
nRBC: 0 % (ref 0.0–0.2)

## 2020-03-04 LAB — BASIC METABOLIC PANEL
Anion gap: 11 (ref 5–15)
BUN: 9 mg/dL (ref 6–20)
CO2: 22 mmol/L (ref 22–32)
Calcium: 8.8 mg/dL — ABNORMAL LOW (ref 8.9–10.3)
Chloride: 105 mmol/L (ref 98–111)
Creatinine, Ser: 0.68 mg/dL (ref 0.44–1.00)
GFR, Estimated: >60 mL/min (ref >60)
Glucose, Bld: 87 mg/dL (ref 70–99)
Potassium: 3.7 mmol/L (ref 3.5–5.1)
Sodium: 138 mmol/L (ref 135–145)

## 2020-03-04 SURGERY — EXCISION, PAROTID GLAND
Anesthesia: General | Site: Face | Laterality: Right

## 2020-03-04 MED ORDER — HYDROMORPHONE HCL 1 MG/ML IJ SOLN
INTRAMUSCULAR | Status: AC
  Administered 2020-03-04: 0.5 mg via INTRAVENOUS
  Filled 2020-03-04: qty 1

## 2020-03-04 MED ORDER — LIDOCAINE 2% (20 MG/ML) 5 ML SYRINGE
INTRAMUSCULAR | Status: DC | PRN
  Administered 2020-03-04: 80 mg via INTRAVENOUS

## 2020-03-04 MED ORDER — ROCURONIUM BROMIDE 10 MG/ML (PF) SYRINGE
PREFILLED_SYRINGE | INTRAVENOUS | Status: AC
  Filled 2020-03-04: qty 10

## 2020-03-04 MED ORDER — CEFAZOLIN SODIUM-DEXTROSE 1-4 GM/50ML-% IV SOLN
1.0000 g | Freq: Three times a day (TID) | INTRAVENOUS | Status: DC
  Administered 2020-03-04 – 2020-03-05 (×2): 1 g via INTRAVENOUS
  Filled 2020-03-04 (×3): qty 50

## 2020-03-04 MED ORDER — CEFAZOLIN SODIUM-DEXTROSE 2-4 GM/100ML-% IV SOLN
2.0000 g | INTRAVENOUS | Status: AC
  Administered 2020-03-04: 2 g via INTRAVENOUS
  Filled 2020-03-04: qty 100

## 2020-03-04 MED ORDER — LIDOCAINE-EPINEPHRINE 1 %-1:100000 IJ SOLN
INTRAMUSCULAR | Status: AC
  Filled 2020-03-04: qty 1

## 2020-03-04 MED ORDER — VENLAFAXINE HCL ER 75 MG PO CP24
75.0000 mg | ORAL_CAPSULE | Freq: Every day | ORAL | Status: DC
  Administered 2020-03-05: 75 mg via ORAL
  Filled 2020-03-04: qty 1

## 2020-03-04 MED ORDER — PROPOFOL 10 MG/ML IV BOLUS
INTRAVENOUS | Status: AC
  Filled 2020-03-04: qty 20

## 2020-03-04 MED ORDER — ONDANSETRON HCL 4 MG/2ML IJ SOLN
INTRAMUSCULAR | Status: DC | PRN
  Administered 2020-03-04: 4 mg via INTRAVENOUS

## 2020-03-04 MED ORDER — MIDAZOLAM HCL 2 MG/2ML IJ SOLN
INTRAMUSCULAR | Status: AC
  Filled 2020-03-04: qty 2

## 2020-03-04 MED ORDER — FENTANYL CITRATE (PF) 250 MCG/5ML IJ SOLN
INTRAMUSCULAR | Status: AC
  Filled 2020-03-04: qty 5

## 2020-03-04 MED ORDER — CHLORHEXIDINE GLUCONATE 0.12 % MT SOLN: 15.0000 mL | Freq: Once | OROMUCOSAL | Status: AC

## 2020-03-04 MED ORDER — ONDANSETRON HCL 4 MG/2ML IJ SOLN
INTRAMUSCULAR | Status: AC
  Filled 2020-03-04: qty 2

## 2020-03-04 MED ORDER — BACITRACIN ZINC 500 UNIT/GM EX OINT
TOPICAL_OINTMENT | CUTANEOUS | Status: AC
  Filled 2020-03-04: qty 28.35

## 2020-03-04 MED ORDER — BIOTIN 1000 MCG PO TABS: 2000.0000 ug | ORAL_TABLET | Freq: Every day | ORAL | Status: DC

## 2020-03-04 MED ORDER — ALBUTEROL SULFATE HFA 108 (90 BASE) MCG/ACT IN AERS
1.0000 | INHALATION_SPRAY | RESPIRATORY_TRACT | Status: DC | PRN
  Filled 2020-03-04: qty 6.7

## 2020-03-04 MED ORDER — EPHEDRINE SULFATE-NACL 50-0.9 MG/10ML-% IV SOSY
PREFILLED_SYRINGE | INTRAVENOUS | Status: DC | PRN
  Administered 2020-03-04: 15 mg via INTRAVENOUS

## 2020-03-04 MED ORDER — FENTANYL CITRATE (PF) 100 MCG/2ML IJ SOLN
INTRAMUSCULAR | Status: AC
  Filled 2020-03-04: qty 2

## 2020-03-04 MED ORDER — CHLORHEXIDINE GLUCONATE 0.12 % MT SOLN
OROMUCOSAL | Status: AC
  Administered 2020-03-04: 15 mL via OROMUCOSAL
  Filled 2020-03-04: qty 15

## 2020-03-04 MED ORDER — SUCCINYLCHOLINE CHLORIDE 200 MG/10ML IV SOSY
PREFILLED_SYRINGE | INTRAVENOUS | Status: DC | PRN
  Administered 2020-03-04: 140 mg via INTRAVENOUS

## 2020-03-04 MED ORDER — FENTANYL CITRATE (PF) 250 MCG/5ML IJ SOLN
INTRAMUSCULAR | Status: DC | PRN
  Administered 2020-03-04: 100 ug via INTRAVENOUS
  Administered 2020-03-04 (×3): 50 ug via INTRAVENOUS

## 2020-03-04 MED ORDER — ONDANSETRON HCL 4 MG/2ML IJ SOLN: 4.0000 mg | Freq: Once | INTRAMUSCULAR | Status: DC | PRN

## 2020-03-04 MED ORDER — FENTANYL CITRATE (PF) 100 MCG/2ML IJ SOLN
INTRAMUSCULAR | Status: AC
  Administered 2020-03-04: 50 ug via INTRAVENOUS
  Filled 2020-03-04: qty 2

## 2020-03-04 MED ORDER — ORAL CARE MOUTH RINSE: 15.0000 mL | Freq: Once | OROMUCOSAL | Status: AC

## 2020-03-04 MED ORDER — LACTATED RINGERS IV SOLN: INTRAVENOUS | Status: DC

## 2020-03-04 MED ORDER — HYDROMORPHONE HCL 1 MG/ML IJ SOLN: 0.2500 mg | INTRAMUSCULAR | Status: DC | PRN

## 2020-03-04 MED ORDER — DEXAMETHASONE SODIUM PHOSPHATE 10 MG/ML IJ SOLN
INTRAMUSCULAR | Status: AC
  Filled 2020-03-04: qty 1

## 2020-03-04 MED ORDER — LEVOTHYROXINE SODIUM 75 MCG PO TABS
75.0000 ug | ORAL_TABLET | Freq: Every day | ORAL | Status: DC
  Administered 2020-03-05: 75 ug via ORAL
  Filled 2020-03-04: qty 1

## 2020-03-04 MED ORDER — MORPHINE SULFATE (PF) 2 MG/ML IV SOLN
2.0000 mg | INTRAVENOUS | Status: DC | PRN
  Administered 2020-03-04 (×2): 2 mg via INTRAVENOUS
  Administered 2020-03-05: 4 mg via INTRAVENOUS
  Filled 2020-03-04 (×2): qty 2
  Filled 2020-03-04: qty 1

## 2020-03-04 MED ORDER — LIDOCAINE 2% (20 MG/ML) 5 ML SYRINGE
INTRAMUSCULAR | Status: AC
  Filled 2020-03-04: qty 5

## 2020-03-04 MED ORDER — LIDOCAINE-EPINEPHRINE 1 %-1:100000 IJ SOLN
INTRAMUSCULAR | Status: DC | PRN
  Administered 2020-03-04: 2.5 mL

## 2020-03-04 MED ORDER — PROPOFOL 10 MG/ML IV BOLUS
INTRAVENOUS | Status: DC | PRN
  Administered 2020-03-04: 150 mg via INTRAVENOUS

## 2020-03-04 MED ORDER — DEXAMETHASONE SODIUM PHOSPHATE 10 MG/ML IJ SOLN
INTRAMUSCULAR | Status: DC | PRN
  Administered 2020-03-04: 10 mg via INTRAVENOUS

## 2020-03-04 MED ORDER — ONDANSETRON HCL 4 MG PO TABS: 4.0000 mg | ORAL_TABLET | ORAL | Status: DC | PRN

## 2020-03-04 MED ORDER — MIDAZOLAM HCL 2 MG/2ML IJ SOLN
INTRAMUSCULAR | Status: DC | PRN
  Administered 2020-03-04: 2 mg via INTRAVENOUS

## 2020-03-04 MED ORDER — ONDANSETRON HCL 4 MG/2ML IJ SOLN
4.0000 mg | INTRAMUSCULAR | Status: DC | PRN
  Administered 2020-03-04: 4 mg via INTRAVENOUS
  Filled 2020-03-04: qty 2

## 2020-03-04 MED ORDER — HYDROCODONE-ACETAMINOPHEN 5-325 MG PO TABS
1.0000 | ORAL_TABLET | Freq: Four times a day (QID) | ORAL | Status: DC | PRN
  Administered 2020-03-04 – 2020-03-05 (×2): 2 via ORAL
  Filled 2020-03-04 (×2): qty 2

## 2020-03-04 MED ORDER — KCL IN DEXTROSE-NACL 20-5-0.45 MEQ/L-%-% IV SOLN
INTRAVENOUS | Status: DC
  Filled 2020-03-04: qty 1000

## 2020-03-04 MED ORDER — BACITRACIN ZINC 500 UNIT/GM EX OINT
1.0000 "application " | TOPICAL_OINTMENT | Freq: Three times a day (TID) | CUTANEOUS | Status: DC
  Administered 2020-03-04 – 2020-03-05 (×2): 1 via TOPICAL
  Filled 2020-03-04: qty 28.4

## 2020-03-04 MED ORDER — 0.9 % SODIUM CHLORIDE (POUR BTL) OPTIME
TOPICAL | Status: DC | PRN
  Administered 2020-03-04: 1000 mL

## 2020-03-04 MED ORDER — FENTANYL CITRATE (PF) 100 MCG/2ML IJ SOLN
25.0000 ug | INTRAMUSCULAR | Status: DC | PRN
  Administered 2020-03-04: 25 ug via INTRAVENOUS
  Administered 2020-03-04: 50 ug via INTRAVENOUS

## 2020-03-04 SURGICAL SUPPLY — 49 items
ATTRACTOMAT 16X20 MAGNETIC DRP (DRAPES) IMPLANT
BLADE CLIPPER SURG (BLADE) IMPLANT
BLADE SURG 15 STRL LF DISP TIS (BLADE) IMPLANT
BLADE SURG 15 STRL SS (BLADE) ×2
CANISTER SUCT 3000ML PPV (MISCELLANEOUS) ×2 IMPLANT
CLEANER TIP ELECTROSURG 2X2 (MISCELLANEOUS) ×2 IMPLANT
CNTNR URN SCR LID CUP LEK RST (MISCELLANEOUS) ×2 IMPLANT
CONT SPEC 4OZ STRL OR WHT (MISCELLANEOUS) ×2
CORD BIPOLAR FORCEPS 12FT (ELECTRODE) ×2 IMPLANT
COVER SURGICAL LIGHT HANDLE (MISCELLANEOUS) ×2 IMPLANT
COVER WAND RF STERILE (DRAPES) ×2 IMPLANT
DRAIN JACKSON RD 7FR 3/32 (WOUND CARE) ×1 IMPLANT
DRAPE HALF SHEET 40X57 (DRAPES) IMPLANT
DRAPE INCISE 13X13 STRL (DRAPES) ×1 IMPLANT
DRAPE INCISE IOBAN 66X45 STRL (DRAPES) ×1 IMPLANT
ELECT COATED BLADE 2.86 ST (ELECTRODE) ×2 IMPLANT
ELECT PAIRED SUBDERMAL (MISCELLANEOUS) ×2
ELECT REM PT RETURN 9FT ADLT (ELECTROSURGICAL) ×2
ELECTRODE PAIRED SUBDERMAL (MISCELLANEOUS) ×1 IMPLANT
ELECTRODE REM PT RTRN 9FT ADLT (ELECTROSURGICAL) ×1 IMPLANT
EVACUATOR SILICONE 100CC (DRAIN) ×2 IMPLANT
FORCEPS BIPOLAR SPETZLER 8 1.0 (NEUROSURGERY SUPPLIES) ×2 IMPLANT
GAUZE 4X4 16PLY RFD (DISPOSABLE) ×2 IMPLANT
GLOVE BIO SURGEON STRL SZ7.5 (GLOVE) ×2 IMPLANT
GOWN STRL REUS W/ TWL LRG LVL3 (GOWN DISPOSABLE) ×2 IMPLANT
GOWN STRL REUS W/TWL LRG LVL3 (GOWN DISPOSABLE) ×4
KIT BASIN OR (CUSTOM PROCEDURE TRAY) ×2 IMPLANT
KIT TURNOVER KIT B (KITS) ×2 IMPLANT
LOOP VESSEL MINI RED (MISCELLANEOUS) ×2 IMPLANT
NDL HYPO 25GX1X1/2 BEV (NEEDLE) ×1 IMPLANT
NEEDLE HYPO 25GX1X1/2 BEV (NEEDLE) ×2 IMPLANT
NS IRRIG 1000ML POUR BTL (IV SOLUTION) ×4 IMPLANT
PAD ARMBOARD 7.5X6 YLW CONV (MISCELLANEOUS) ×4 IMPLANT
PENCIL SMOKE EVACUATOR (MISCELLANEOUS) ×2 IMPLANT
POSITIONER HEAD DONUT 9IN (MISCELLANEOUS) ×1 IMPLANT
PROBE NERVBE PRASS .33 (MISCELLANEOUS) ×2 IMPLANT
SHEARS HARMONIC 9CM CVD (BLADE) ×2 IMPLANT
SPECIMEN JAR MEDIUM (MISCELLANEOUS) ×2 IMPLANT
STAPLER VISISTAT 35W (STAPLE) ×2 IMPLANT
SUT ETHILON 2 0 FS 18 (SUTURE) ×2 IMPLANT
SUT ETHILON 5 0 P 3 18 (SUTURE) ×2
SUT NYLON ETHILON 5-0 P-3 1X18 (SUTURE) ×1 IMPLANT
SUT SILK 2 0 PERMA HAND 18 BK (SUTURE) ×2 IMPLANT
SUT SILK 2 0 SH CR/8 (SUTURE) ×2 IMPLANT
SUT SILK 3 0 REEL (SUTURE) ×2 IMPLANT
SUT VIC AB 3-0 SH 27 (SUTURE) ×2
SUT VIC AB 3-0 SH 27X BRD (SUTURE) ×1 IMPLANT
SUT VIC AB 4-0 PS2 27 (SUTURE) ×1 IMPLANT
TRAY ENT MC OR (CUSTOM PROCEDURE TRAY) ×2 IMPLANT

## 2020-03-04 NOTE — Progress Notes (Signed)
New Admission Note:  Arrival Method: stretcher from PACU  Mental Orientation: Alert & oriented x 4 Telemetry:n/a Skin: Surgical incision on R side of neck  IV: L hand/ LR @ 50m  Pain:10/10 , PRN administrated  Safety Measures: Safety Fall Prevention Plan was given, discussed. 5M15: Patient has been orientated to the room, unit and the staff. Family: husband @ the bedside   Orders have been reviewed and implemented. Will continue to monitor the patient. Call light has been placed within reach and bed alarm has been activated.   YArta Silence,RN

## 2020-03-04 NOTE — Brief Op Note (Signed)
03/04/2020  4:55 PM  PATIENT:  Sydney Vaughn  44 y.o. female  PRE-OPERATIVE DIAGNOSIS:  Parotid neoplasm  POST-OPERATIVE DIAGNOSIS:  Parotid neoplasm  PROCEDURE:  Procedure(s): right superficial PAROTIDECTOMY, with nerve preservation (Right)  SURGEON:  Surgeon(s) and Role:    Melida Quitter, MD - Primary  PHYSICIAN ASSISTANT: [place]  ASSISTANTS: none   ANESTHESIA:   general  EBL:  20 mL   BLOOD ADMINISTERED:none  DRAINS: (7 Fr) Jackson-Pratt drain(s) with closed bulb suction in the right neck   LOCAL MEDICATIONS USED:  LIDOCAINE   SPECIMEN:  Source of Specimen:  Right parotid gland  DISPOSITION OF SPECIMEN:  PATHOLOGY  COUNTS:  YES  TOURNIQUET:  * No tourniquets in log *  DICTATION: .Note written in EPIC  PLAN OF CARE: Admit for overnight observation  PATIENT DISPOSITION:  PACU - hemodynamically stable.   Delay start of Pharmacological VTE agent (>24hrs) due to surgical blood loss or risk of bleeding: yes

## 2020-03-04 NOTE — Anesthesia Procedure Notes (Signed)
Procedure Name: Intubation Date/Time: 03/04/2020 2:55 PM Performed by: Lance Coon, CRNA Pre-anesthesia Checklist: Patient identified, Emergency Drugs available, Suction available, Patient being monitored and Timeout performed Patient Re-evaluated:Patient Re-evaluated prior to induction Oxygen Delivery Method: Circle system utilized Preoxygenation: Pre-oxygenation with 100% oxygen Induction Type: IV induction Ventilation: Mask ventilation without difficulty Laryngoscope Size: Miller and 2 Grade View: Grade I Tube type: Oral Tube size: 7.0 mm Number of attempts: 1 Airway Equipment and Method: Stylet Placement Confirmation: ETT inserted through vocal cords under direct vision,  positive ETCO2 and breath sounds checked- equal and bilateral Secured at: 21 cm Tube secured with: Tape Dental Injury: Teeth and Oropharynx as per pre-operative assessment

## 2020-03-04 NOTE — Transfer of Care (Signed)
Immediate Anesthesia Transfer of Care Note  Patient: Barbera Setters  Procedure(s) Performed: right superficial PAROTIDECTOMY, with nerve preservation (Right Face)  Patient Location: PACU  Anesthesia Type:General  Level of Consciousness: drowsy and responds to stimulation  Airway & Oxygen Therapy: Patient Spontanous Breathing and Patient connected to nasal cannula oxygen  Post-op Assessment: Report given to RN  Post vital signs: Reviewed and stable  Last Vitals:  Vitals Value Taken Time  BP 131/81 03/04/20 1706  Temp    Pulse 109 03/04/20 1707  Resp 15 03/04/20 1707  SpO2 97 % 03/04/20 1707  Vitals shown include unvalidated device data.  Last Pain:  Vitals:   03/04/20 1319  TempSrc:   PainSc: 0-No pain         Complications: No complications documented.

## 2020-03-04 NOTE — Anesthesia Postprocedure Evaluation (Signed)
Anesthesia Post Note  Patient: Barbera Setters  Procedure(s) Performed: right superficial PAROTIDECTOMY, with nerve preservation (Right Face)     Patient location during evaluation: PACU Anesthesia Type: General Level of consciousness: sedated Pain management: pain level controlled Vital Signs Assessment: post-procedure vital signs reviewed and stable Respiratory status: spontaneous breathing and respiratory function stable Cardiovascular status: stable Postop Assessment: no apparent nausea or vomiting Anesthetic complications: no   No complications documented.  Last Vitals:  Vitals:   03/04/20 1837 03/04/20 1857  BP: 128/90 (!) 141/93  Pulse: (!) 111 (!) 105  Resp: 15 18  Temp: 36.4 C 36.4 C  SpO2: 96% (!) 89%    Last Pain:                 Merlinda Frederick

## 2020-03-04 NOTE — Op Note (Signed)
Preop diagnosis: Right parotid neoplasm Postop diagnosis: same Procedure: Right superficial parotidectomy with dissection of facial nerve Surgeon: Redmond Baseman Assist: [place], PA who was necessary to assist with dissection and closure. Anesth: General and local with 1% lidocaine with 1:100,000 epinephrine Compl: None Findings: Right superior parotid with superficial mass dissected easily from nerve branches. Description:  After discussing risks, benefits, and alternatives, the patient was brought to the operative suite and placed on the operative table in the supine position.  Anesthesia was induced and the patient was intubated by the anesthesia team without difficulty.  The patient was given intravenous antibiotics.  The right parotid incision was marked with a marking pen and injected with local anesthetic.  The nerve integrity monitor was placed in the face for facial nerve monitoring and turned on during the case.  The right face and neck were prepped and draped in sterile fashion.  The incision was made with a 15 blade scalpel and extended through the subcutaneous and platysma layers with Bovie electrocautery.  A pre-parotid flap was elevated anteriorly and the earlobe was freed.  Skin flaps were sutured back with stay sutures.  Dissection was then performed anterior to the tragus and parotid tissue was elevated off of the mastoid.  With further dissection to the stylomastoid foramen, the main trunk of the facial nerve was identified.  The nerve was dissected to the main division and then somewhat down the inferior branches, dividing parotid tissue using the harmonic scalpel and bipolar electrocautery.  The superior division branches were then dissected in an antegrade fashion, elevating the mass superiorly.  Each branch was able to be identified and dissected free.  The nerve stimulator was used for assistance.The mass within the superior lateral lobe was then removed and passed to nursing for pathology.     The surgical site was then irrigated copiously with saline.  A 7 French suction drain was placed in the depth of the wound and secured at the skin with 2-0 Nylon with a standard drain stitch.  The flaps were released and laid back down.  The platysma/subcutaneous layer was closed with 3-0 Vicryl in a simple, running fashion.  The skin was closed with 5-0 Nylon in a simple, running fashion.  The drain was placed to bulb suction.  Bacitracin ointment was added to the incision.  Drapes were removed and the drain was secured to the shoulder with tape.  She was then returned to anesthesia for wake-up and was extubated and moved to the recovery room in stable condition.

## 2020-03-04 NOTE — Anesthesia Preprocedure Evaluation (Signed)
Anesthesia Evaluation  Patient identified by MRN, date of birth, ID band Patient awake    Reviewed: Allergy & Precautions, NPO status , Patient's Chart, lab work & pertinent test results  Airway Mallampati: II  TM Distance: >3 FB Neck ROM: Full    Dental  (+) Teeth Intact, Dental Advisory Given   Pulmonary    breath sounds clear to auscultation       Cardiovascular  Rhythm:Regular Rate:Normal     Neuro/Psych    GI/Hepatic   Endo/Other    Renal/GU      Musculoskeletal   Abdominal   Peds  Hematology   Anesthesia Other Findings   Reproductive/Obstetrics                             Anesthesia Physical Anesthesia Plan  ASA: II  Anesthesia Plan: General   Post-op Pain Management:    Induction: Intravenous  PONV Risk Score and Plan: Dexamethasone and Ondansetron  Airway Management Planned: Oral ETT  Additional Equipment:   Intra-op Plan:   Post-operative Plan: Extubation in OR  Informed Consent: I have reviewed the patients History and Physical, chart, labs and discussed the procedure including the risks, benefits and alternatives for the proposed anesthesia with the patient or authorized representative who has indicated his/her understanding and acceptance.     Dental advisory given  Plan Discussed with: CRNA and Anesthesiologist  Anesthesia Plan Comments:         Anesthesia Quick Evaluation

## 2020-03-04 NOTE — H&P (Signed)
Sydney Vaughn is an 44 y.o. female.   Chief Complaint: Parotid mass HPI: 44 year old female with right parotid mass for the past year that has grown in recent months and is somewhat tender.  FNA was inconclusive.  She presents for surgical management.  Past Medical History:  Diagnosis Date  . Adult acne   . ANA positive   . Anemia    iron infusions  . Asthma   . Chronic sinusitis    CT and MRI.   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 Fibromyalgia   . Foot fracture, left 08/2012  . History of appendicitis 09/2006  . History of blood transfusion    last time 02/2018 hysterectomy surgery  . Hypothyroidism   . Intramural uterine fibroid 2019  . Iron deficiency   . Menorrhagia   . Metatarsal fracture 10/2012   Right Proximal fifth metatarsal fracture  . Obesity   . Panic attack    at bedtime, awakes from sleep  . Pelvic pain    resolved  . Polyarthralgia    was seeing rheumatology  . PONV (postoperative nausea and vomiting)     Past Surgical History:  Procedure Laterality Date  . ABDOMINAL HYSTERECTOMY  04/2018  . APPENDECTOMY    . BUNIONECTOMY Left   . DILATION AND EVACUATION    . TUBAL LIGATION      Family History  Problem Relation Age of Onset  . Thyroid disease Mother   . Sinusitis Sister   . Sinusitis Brother   . Lupus Daughter        treated at [facility]  . Allergies Daughter    Social History:  reports that she has never smoked. She has never used smokeless tobacco. She reports previous alcohol use. She reports that she does not use drugs.  Allergies:  Allergies  Allergen Reactions  . Contrave [Naltrexone-Bupropion Hcl Er] Nausea Only    Nausea and dizziness  . Oxycodone-Acetaminophen Er     Swollen face, eyes and caused itching     Medications Prior to Admission  Medication Sig Dispense Refill  . Biotin 1000 MCG tablet Take 2,000 mcg by mouth daily.    Marland Kitchen levothyroxine (SYNTHROID) 75 MCG tablet TAKE 1 TABLET (75 MCG TOTAL) BY MOUTH  DAILY BEFORE BREAKFAST. 90 tablet 0  . nitrofurantoin, macrocrystal-monohydrate, (MACROBID) 100 MG capsule Take 100 mg by mouth 2 (two) times daily.    Marland Kitchen venlafaxine XR (EFFEXOR-XR) 75 MG 24 hr capsule TAKE 1 CAPSULE (75 MG TOTAL) BY MOUTH DAILY WITH BREAKFAST. 90 capsule 3  . albuterol (PROVENTIL HFA;VENTOLIN HFA) 108 (90 Base) MCG/ACT inhaler Inhale 1-2 puffs into the lungs every 4 (four) hours as needed for wheezing or shortness of breath. 1 Inhaler 5    Results for orders placed or performed during the hospital encounter of 03/04/20 (from the past 48 hour(s))  Basic metabolic panel per protocol     Status: Abnormal   Collection Time: 03/04/20 12:29 PM  Result Value Ref Range   Sodium 138 135 - 145 mmol/L   Potassium 3.7 3.5 - 5.1 mmol/L   Chloride 105 98 - 111 mmol/L   CO2 22 22 - 32 mmol/L   Glucose, Bld 87 70 - 99 mg/dL    Comment: Glucose reference range applies only to samples taken after fasting for at least 8 hours.   BUN 9 6 - 20 mg/dL   Creatinine, Ser 0.68 0.44 - 1.00 mg/dL   Calcium  8.8 (L) 8.9 - 10.3 mg/dL   GFR, Estimated >60 >60 mL/min   Anion gap 11 5 - 15    Comment: Performed at [facility] [address] [place], [place] [address]  CBC per protocol     Status: None   Collection Time: 03/04/20 12:29 PM  Result Value Ref Range   WBC 7.6 4.0 - 10.5 K/uL   RBC 4.52 3.87 - 5.11 MIL/uL   Hemoglobin 14.6 12.0 - 15.0 g/dL   HCT 43.9 36 - 46 %   MCV 97.1 80.0 - 100.0 fL   MCH 32.3 26.0 - 34.0 pg   MCHC 33.3 30.0 - 36.0 g/dL   RDW 12.9 11.5 - 15.5 %   Platelets 293 150 - 400 K/uL   nRBC 0.0 0.0 - 0.2 %    Comment: Performed at [facility], [place] [address] [place], [place] [address]   No results found.  Review of Systems  All other systems reviewed and are negative.   Blood pressure 128/87, pulse 89, temperature 98.3 F (36.8 C), temperature source Oral, resp. rate 18, height 5' 4"  (1.626 m), weight 81.6 kg, last menstrual period 04/14/2018, SpO2  96 %. Physical Exam Constitutional:      Appearance: Normal appearance. She is normal weight.  HENT:     Head: Normocephalic and atraumatic.     Right Ear: External ear normal.     Left Ear: External ear normal.     Nose: Nose normal.     Mouth/Throat:     Mouth: Mucous membranes are moist.     Pharynx: Oropharynx is clear.  Eyes:     Extraocular Movements: Extraocular movements intact.     Conjunctiva/sclera: Conjunctivae normal.     Pupils: Pupils are equal, round, and reactive to light.  Cardiovascular:     Rate and Rhythm: Normal rate.  Pulmonary:     Effort: Pulmonary effort is normal.  Skin:    General: Skin is warm and dry.  Neurological:     General: No focal deficit present.     Mental Status: She is alert and oriented to person, place, and time.  Psychiatric:        Mood and Affect: Mood normal.        Behavior: Behavior normal.        Thought Content: Thought content normal.        Judgment: Judgment normal.      Assessment/Plan Right parotid neoplasm  To OR for right parotidectomy.  Melida Quitter, MD 03/04/2020, 1:45 PM

## 2020-03-05 ENCOUNTER — Encounter (HOSPITAL_COMMUNITY): Payer: Self-pay | Admitting: Otolaryngology

## 2020-03-05 DIAGNOSIS — J45909 Unspecified asthma, uncomplicated: Secondary | ICD-10-CM | POA: Diagnosis not present

## 2020-03-05 DIAGNOSIS — K118 Other diseases of salivary glands: Secondary | ICD-10-CM | POA: Diagnosis not present

## 2020-03-05 DIAGNOSIS — Z79899 Other long term (current) drug therapy: Secondary | ICD-10-CM | POA: Diagnosis not present

## 2020-03-05 DIAGNOSIS — E039 Hypothyroidism, unspecified: Secondary | ICD-10-CM | POA: Diagnosis not present

## 2020-03-05 DIAGNOSIS — C07 Malignant neoplasm of parotid gland: Secondary | ICD-10-CM | POA: Diagnosis not present

## 2020-03-05 MED ORDER — HYDROCODONE-ACETAMINOPHEN 5-325 MG PO TABS
1.0000 | ORAL_TABLET | Freq: Four times a day (QID) | ORAL | 0 refills | Status: DC | PRN
Start: 2020-03-05 — End: 2020-11-06

## 2020-03-05 NOTE — Progress Notes (Signed)
Sydney Vaughn to be discharged Home per MD order. Discussed prescriptions and follow up appointments with the patient. Prescriptions and medication list explained in detail. Patient verbalized understanding.  Skin clean, dry and intact without evidence of skin break down, no evidence of skin tears noted. IV catheter discontinued intact. Site without signs and symptoms of complications. Dressing and pressure applied. Pt denies pain at the site currently. No complaints noted.  Patient free of lines, drains, and wounds.   An After Visit Summary (AVS) was printed and given to the patient. Patient escorted via wheelchair, and discharged home via private auto.  Amaryllis Dyke, RN

## 2020-03-05 NOTE — Discharge Summary (Signed)
Physician Discharge Summary  Patient ID: Sydney Vaughn MRN: [id number] DOB/AGE: [birth year] 46 y.o.  Admit date: 03/04/2020 Discharge date: 03/05/2020  Admission Diagnoses: Parotid neoplasm  Discharge Diagnoses:  Active Problems:   Parotid neoplasm   Discharged Condition: good  Hospital Course: 44 year old female with growing right parotid mass presented for surgical management.  See operative note.  She was observed overnight with drain in place and did well.  On POD 1, she was felt stable for discharge home after drain removal.  Consults: None  Significant Diagnostic Studies: None  Treatments: surgery: Right parotidectomy  Discharge Exam: Blood pressure 109/66, pulse 79, temperature 98.3 F (36.8 C), temperature source Oral, resp. rate 16, height 5' 4"  (1.626 m), weight 88.6 kg, last menstrual period 04/14/2018, SpO2 100 %. General appearance: alert, cooperative and no distress Neck: right parotid incision clean and intact, drain removed, no fluid collection, normal facial movement  Disposition: Discharge disposition: 01-Home or Self Care       Discharge Instructions    Diet - low sodium heart healthy   Complete by: As directed    Discharge instructions   Complete by: As directed    Apply antibiotic ointment to incision twice daily.  Wait to shower incision until tomorrow, gently pat dry.  Bland diet only.  Avoid strenuous activity.   Discharge wound care:   Complete by: As directed    As above.   Increase activity slowly   Complete by: As directed      Allergies as of 03/05/2020      Reactions   Contrave [naltrexone-bupropion Hcl Er] Nausea Only   Nausea and dizziness   Oxycodone-acetaminophen Er    Swollen face, eyes and caused itching       Medication List    TAKE these medications   albuterol 108 (90 Base) MCG/ACT inhaler Commonly known as: VENTOLIN HFA Inhale 1-2 puffs into the lungs every 4 (four) hours as needed for wheezing or shortness of  breath.   Biotin 1000 MCG tablet Take 2,000 mcg by mouth daily.   HYDROcodone-acetaminophen 5-325 MG tablet Commonly known as: NORCO/VICODIN Take 1-2 tablets by mouth every 6 (six) hours as needed for moderate pain.   levothyroxine 75 MCG tablet Commonly known as: SYNTHROID TAKE 1 TABLET (75 MCG TOTAL) BY MOUTH DAILY BEFORE BREAKFAST.   nitrofurantoin (macrocrystal-monohydrate) 100 MG capsule Commonly known as: MACROBID Take 100 mg by mouth 2 (two) times daily.   venlafaxine XR 75 MG 24 hr capsule Commonly known as: EFFEXOR-XR TAKE 1 CAPSULE (75 MG TOTAL) BY MOUTH DAILY WITH BREAKFAST.            Discharge Care Instructions  (From admission, onward)         Start     Ordered   03/05/20 0000  Discharge wound care:       Comments: As above.   03/05/20 0962          Follow-up Information    Melida Quitter, MD. Schedule an appointment as soon as possible for a visit in 1 week.   Specialty: Otolaryngology Contact information: [address] [place] [address] [phone]               Signed: Melida Quitter 03/05/2020, 8:24 AM

## 2020-03-06 DIAGNOSIS — I808 Phlebitis and thrombophlebitis of other sites: Secondary | ICD-10-CM | POA: Diagnosis not present

## 2020-03-06 DIAGNOSIS — M79602 Pain in left arm: Secondary | ICD-10-CM | POA: Diagnosis not present

## 2020-03-06 DIAGNOSIS — F419 Anxiety disorder, unspecified: Secondary | ICD-10-CM | POA: Diagnosis not present

## 2020-03-06 DIAGNOSIS — Z885 Allergy status to narcotic agent status: Secondary | ICD-10-CM | POA: Diagnosis not present

## 2020-03-06 DIAGNOSIS — M79601 Pain in right arm: Secondary | ICD-10-CM | POA: Diagnosis not present

## 2020-03-06 DIAGNOSIS — T801XXA Vascular complications following infusion, transfusion and therapeutic injection, initial encounter: Secondary | ICD-10-CM | POA: Diagnosis not present

## 2020-03-06 DIAGNOSIS — T8172XA Complication of vein following a procedure, not elsewhere classified, initial encounter: Secondary | ICD-10-CM | POA: Diagnosis not present

## 2020-03-06 DIAGNOSIS — C07 Malignant neoplasm of parotid gland: Secondary | ICD-10-CM | POA: Diagnosis not present

## 2020-03-06 DIAGNOSIS — Z888 Allergy status to other drugs, medicaments and biological substances status: Secondary | ICD-10-CM | POA: Diagnosis not present

## 2020-03-06 DIAGNOSIS — Z79899 Other long term (current) drug therapy: Secondary | ICD-10-CM | POA: Diagnosis not present

## 2020-03-06 DIAGNOSIS — L539 Erythematous condition, unspecified: Secondary | ICD-10-CM | POA: Diagnosis not present

## 2020-03-13 ENCOUNTER — Encounter: Payer: Self-pay | Admitting: Emergency Medicine

## 2020-03-16 ENCOUNTER — Encounter: Payer: Self-pay | Admitting: Emergency Medicine

## 2020-03-17 NOTE — Telephone Encounter (Signed)
Please look into this message.  She had surgery and biopsy done by the ENT team.  They should be contacting the patient with results and follow-up arrangements.  Please look into this situation and call patient.  Thanks.

## 2020-03-19 DIAGNOSIS — H6120 Impacted cerumen, unspecified ear: Secondary | ICD-10-CM | POA: Insufficient documentation

## 2020-03-20 ENCOUNTER — Encounter (HOSPITAL_COMMUNITY): Payer: Self-pay | Admitting: Otolaryngology

## 2020-03-22 LAB — SURGICAL PATHOLOGY

## 2020-04-01 ENCOUNTER — Telehealth: Payer: Self-pay | Admitting: Hematology and Oncology

## 2020-04-01 ENCOUNTER — Other Ambulatory Visit: Payer: Self-pay

## 2020-04-01 ENCOUNTER — Inpatient Hospital Stay
Payer: Federal, State, Local not specified - PPO | Attending: Hematology and Oncology | Admitting: Hematology and Oncology

## 2020-04-01 ENCOUNTER — Encounter: Payer: Self-pay | Admitting: Hematology and Oncology

## 2020-04-01 DIAGNOSIS — D509 Iron deficiency anemia, unspecified: Secondary | ICD-10-CM | POA: Diagnosis not present

## 2020-04-01 DIAGNOSIS — Z90710 Acquired absence of both cervix and uterus: Secondary | ICD-10-CM | POA: Diagnosis not present

## 2020-04-01 DIAGNOSIS — M792 Neuralgia and neuritis, unspecified: Secondary | ICD-10-CM

## 2020-04-01 DIAGNOSIS — C8581 Other specified types of non-Hodgkin lymphoma, lymph nodes of head, face, and neck: Secondary | ICD-10-CM

## 2020-04-01 DIAGNOSIS — M436 Torticollis: Secondary | ICD-10-CM | POA: Diagnosis not present

## 2020-04-01 DIAGNOSIS — F410 Panic disorder [episodic paroxysmal anxiety] without agoraphobia: Secondary | ICD-10-CM | POA: Insufficient documentation

## 2020-04-01 DIAGNOSIS — C884 Extranodal marginal zone B-cell lymphoma of mucosa-associated lymphoid tissue [MALT-lymphoma]: Secondary | ICD-10-CM | POA: Diagnosis not present

## 2020-04-01 DIAGNOSIS — Z79899 Other long term (current) drug therapy: Secondary | ICD-10-CM | POA: Insufficient documentation

## 2020-04-01 DIAGNOSIS — E039 Hypothyroidism, unspecified: Secondary | ICD-10-CM | POA: Diagnosis not present

## 2020-04-01 NOTE — Progress Notes (Signed)
[place] progress notes  Patient Care Team: Horald Pollen, MD as PCP - General (Internal Medicine) Jarome Matin, MD as Consulting Physician (Dermatology) Horald Pollen, MD as Referring Physician (Internal Medicine)  CHIEF COMPLAINTS/PURPOSE OF VISIT:  Extranodal marginal zone lymphoma  HISTORY OF PRESENTING ILLNESS:  Sydney Vaughn 44 y.o. female is well-known to me for history of iron deficiency anemia She is being referred to see me now from ENT service due to recent findings of B-cell lymphoma According to the patient, she has not been feeling well with sinus congestion/infection for about a month around August of this year She has been on 3 different courses of antibiotics without improvement She saw her primary care doctor and was recommended to see an ENT physician She had CT imaging done over the right parotid area because she palpated a mass.  She underwent surgery approximately a month ago Since surgery, she has been complaining of neck discomfort on the right side along with intermittent neuropathic pain shooting over across her face on the right side  I reviewed the patient's records extensive and collaborated the history with the patient. Summary of her history is as follows: Oncology History  Marginal zone lymphoma of lymph nodes of neck ([place])  02/07/2020 Imaging   Right parotid and adjacent subcutaneous mass without diagnostic feature. Lymphoma, sarcoid, or IgG 4 related disease are considered given the infiltrative appearance. If a primary parotid neoplasm the extent of the mass would be concerning for aggressive disease. The lesion extends to the subcutaneous preauricular space and should be readily amenable to biopsy.   03/04/2020 Surgery   Procedure: Right superficial parotidectomy with dissection of facial nerve Surgeon: Redmond Baseman Anesth: General and local with 1% lidocaine with 1:100,000 epinephrine Findings: Right superior parotid  with superficial mass dissected easily from nerve branches.   03/04/2020 Pathology Results   FINAL MICROSCOPIC DIAGNOSIS:   A. PAROTID, RIGHT, PAROTIDECTOMY:  - Atypical lymphoid population, see comment.  - Benign salivary gland tissue.   COMMENT:   There is effacement of the salivary gland tissue by a monotonous population of small lymphoid cells with moderate clear cytoplasm. There are lymphoepithelial type lesions. Immunohistochemistry reveals the atypical lymphoid cells are positive for CD20 and bcl-2. There are residual disrupted germinal centers (CD23, bcl-6, CD10-positive; bcl-2 negative). CD3, CD43, and CD5 reveal admixed T-cells. CyclinD1 is negative. Ki-67 is overall low and elevated in residual germinal centers. CD138 reveals only scattered plasma cells which appear polytypic by light chain in situ hybridization. 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 While the morphology is suspicious, there is no aberrant phenotype or monotypic plasma cell population. Thus, PCR for B-cell gene rearrangement will be ordered to assess clonality (results to be reported in an addendum).  ADDENDUM:   PCR for B-cell gene rearrangement is POSITIVE.   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     Her daughter has lupus.  No other autoimmune disorder in the family.  She has not been diagnosed with autoimmune disorders  MEDICAL HISTORY:  Past Medical History:  Diagnosis Date  . Adult acne   . ANA positive   . Anemia    iron infusions  . Asthma   . Chronic sinusitis    CT and MRI.   Marland Kitchen DJD (degenerative joint  disease)    1st MTP joint  . Fibromyalgia   . Foot fracture, left 08/2012  . History of appendicitis 09/2006  .  History of blood transfusion    last time 02/2018 hysterectomy surgery  . Hypothyroidism   . Intramural uterine fibroid 2019  . Iron deficiency   . Menorrhagia   . Metatarsal fracture 10/2012   Right Proximal fifth metatarsal fracture  . Obesity   . Panic attack    at bedtime, awakes from sleep  . Pelvic pain    resolved  . Polyarthralgia    was seeing rheumatology  . PONV (postoperative nausea and vomiting)     SURGICAL HISTORY: Past Surgical History:  Procedure Laterality Date  . ABDOMINAL HYSTERECTOMY  04/2018  . APPENDECTOMY    . BUNIONECTOMY Left   . DILATION AND EVACUATION    . PAROTIDECTOMY Right 03/04/2020   Procedure: right superficial PAROTIDECTOMY, with nerve preservation;  Surgeon: Melida Quitter, MD;  Location: [facility];  Service: ENT;  Laterality: Right;  . TUBAL LIGATION      SOCIAL HISTORY: Social History   Socioeconomic History  . Marital status: Married    Spouse name: Not on file  . Number of children: 4  . Years of education: Not on file  . 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 Smoking status: Never Smoker  . Smokeless tobacco: Never Used  Vaping Use  . Vaping Use: Never used  Substance and Sexual Activity  . Alcohol use: Not Currently    Comment: occ   . Drug use: No  . Sexual activity: Yes    Partners: Male    Birth control/protection: Surgical    Comment: 1st iHysterectomy  Other Topics Concern  . Not on file  Social History Narrative   [name] lives in [place] with her husband and children. She has 4 daughters: ages ranging from 4 yo- 90 yo. 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     Social Determinants of Health   Financial Resource Strain:   . Difficulty of Paying Living Expenses: Not on file  Food Insecurity:   . Worried About Charity fundraiser in the Last Year: Not on file  . Ran Out of  Food in the Last Year: Not on file  Transportation Needs:   . Lack of Transportation (Medical): Not on file  . Lack of Transportation (Non-Medical): Not on file  Physical Activity:   . Days of Exercise per Week: Not on file  . Minutes of Exercise per Session: Not on file  Stress:   . Feeling of Stress : Not on file  Social Connections:   . Frequency of Communication with Friends and Family: Not on file  . Frequency of Social Gatherings with Friends and Family: Not on file  . Attends Religious Services: Not on file  . Active Member of Clubs or Organizations: Not on file  . Attends Archivist Meetings: Not on file  . Marital Status: Not on file  Intimate Partner Violence:   . Fear of Current or Ex-Partner: Not on file  . Emotionally Abused: Not on file  . Physically Abused: Not on file  . Sexually Abused: Not on file    FAMILY HISTORY: Family History  Problem Relation Age of Onset  . Thyroid disease Mother   . Sinusitis Sister   .  Sinusitis Brother   . Lupus Daughter        treated at [facility]  . Allergies Daughter     ALLERGIES:  is allergic to contrave [naltrexone-bupropion hcl er] and oxycodone-acetaminophen er.  MEDICATIONS:  Current Outpatient Medications  Medication Sig Dispense Refill  . albuterol (PROVENTIL HFA;VENTOLIN HFA) 108 (90 Base) MCG/ACT inhaler Inhale 1-2 puffs into the lungs every 4 (four) hours as needed for wheezing or shortness of breath. 1 Inhaler 5  . Biotin 1000 MCG tablet Take 2,000 mcg by mouth daily.    Marland Kitchen HYDROcodone-acetaminophen (NORCO/VICODIN) 5-325 MG tablet Take 1-2 tablets by mouth every 6 (six) hours as needed for moderate pain. 12 tablet 0  . levothyroxine (SYNTHROID) 75 MCG tablet TAKE 1 TABLET (75 MCG TOTAL) BY MOUTH DAILY BEFORE BREAKFAST. 90 tablet 0  . nitrofurantoin, macrocrystal-monohydrate, (MACROBID) 100 MG capsule Take 100 mg by mouth 2 (two) times daily.    Marland Kitchen venlafaxine XR (EFFEXOR-XR) 75 MG 24 hr capsule TAKE 1  CAPSULE (75 MG TOTAL) BY MOUTH DAILY WITH BREAKFAST. 90 capsule 3   No current facility-administered medications for this visit.    REVIEW OF SYSTEMS:   Constitutional: Denies fevers, chills or abnormal night sweats Eyes: Denies blurriness of vision, double vision or watery eyes Ears, nose, mouth, throat, and face: Denies mucositis or sore throat Respiratory: Denies cough, dyspnea or wheezes Cardiovascular: Denies palpitation, chest discomfort or lower extremity swelling Gastrointestinal:  Denies nausea, heartburn or change in bowel habits Skin: Denies abnormal skin rashes Lymphatics: Denies new lymphadenopathy or easy bruising Neurological:Denies numbness, tingling or new weaknesses Behavioral/Psych: Mood is stable, no new changes  All other systems were reviewed with the patient and are negative.  PHYSICAL EXAMINATION: ECOG PERFORMANCE STATUS: 1 - Symptomatic but completely ambulatory  Vitals:   04/01/20 1022  BP: 114/90  Pulse: 83  Resp: 18  Temp: (!) 97.4 F (36.3 C)  SpO2: 99%   Filed Weights   04/01/20 1022  Weight: 183 lb 12.8 oz (83.4 kg)    GENERAL:alert, no distress and comfortable SKIN: skin color, texture, turgor are normal, no rashes or significant lesions EYES: normal, conjunctiva are pink and non-injected, sclera clear OROPHARYNX:no exudate, normal lips, buccal mucosa, and tongue  NECK: Noted well-healed surgical scar.  She has somewhat limited neck movement due to discomfort LYMPH:  no palpable lymphadenopathy in the cervical, axillary or inguinal LUNGS: clear to auscultation and percussion with normal breathing effort HEART: regular rate & rhythm and no murmurs without lower extremity edema ABDOMEN:abdomen soft, non-tender and normal bowel sounds Musculoskeletal:no cyanosis of digits and no clubbing  PSYCH: alert & oriented x 3 with fluent speech NEURO: no focal motor/sensory deficits  LABORATORY DATA:  I have reviewed the data as listed Lab Results   Component Value Date   WBC 7.6 03/04/2020   HGB 14.6 03/04/2020   HCT 43.9 03/04/2020   MCV 97.1 03/04/2020   PLT 293 03/04/2020   Recent Labs    08/21/19 1159 03/04/20 1229  NA  --  138  K  --  3.7  CL  --  105  CO2  --  22  GLUCOSE 82 87  BUN  --  9  CREATININE  --  0.68  CALCIUM  --  8.8*  GFRNONAA  --  >60    RADIOGRAPHIC STUDIES: CT neck was reviewed I have personally reviewed the radiological images as listed and agreed with the findings in the report.   ASSESSMENT & PLAN:  Marginal zone  lymphoma of lymph nodes of neck ([place]) I reviewed test results with the patient and her husband We discussed approach for diagnosis of B-cell, non-Hodgkin's lymphoma Extranodal marginal zone lymphoma is considered low-grade type disease It can be associated with autoimmune disorders The first step would be to order staging PET CT scan to determine whether she have other disease involvement elsewhere I will see her back next week for further follow-up If PET CT scan show no evidence of residual disease, she does not need further treatment  Neck stiffness She has limited neck movement due to recent surgery I recommend cancer rehab with physical therapy  Neuropathic pain She has slight neuropathic sensation/pain affecting the right side of her face after surgery I suspect this is due to bruising of the cranial nerve I recommend conservative approach for now   Orders Placed This Encounter  Procedures  . NM PET Image Initial (PI) Skull Base To Thigh    Standing Status:   Future    Standing Expiration Date:   04/01/2021    Order Specific Question:   If indicated for the ordered procedure, I authorize the administration of a radiopharmaceutical per Radiology protocol    Answer:   Yes    Order Specific Question:   Is the patient pregnant?    Answer:   No    Order Specific Question:   Preferred imaging location?    Answer:   [facility]    All questions were answered.  The patient knows to call the clinic with any problems, questions or concerns. The total time spent in the appointment was 55 minutes encounter with patients including review of chart and various tests results, discussions about plan of care and coordination of care plan   Heath Lark, MD 04/01/2020 1:12 PM

## 2020-04-01 NOTE — Assessment & Plan Note (Addendum)
I reviewed test results with the patient and her husband We discussed approach for diagnosis of B-cell, non-Hodgkin's lymphoma Extranodal marginal zone lymphoma is considered low-grade type disease It can be associated with autoimmune disorders The first step would be to order staging PET CT scan to determine whether she have other disease involvement elsewhere I will see her back next week for further follow-up If PET CT scan show no evidence of residual disease, she does not need further treatment

## 2020-04-01 NOTE — Telephone Encounter (Signed)
Scheduled appts per 11/17 sch msg. Pt declined print out and stated she would refer to mychart.

## 2020-04-01 NOTE — Assessment & Plan Note (Signed)
She has slight neuropathic sensation/pain affecting the right side of her face after surgery I suspect this is due to bruising of the cranial nerve I recommend conservative approach for now

## 2020-04-01 NOTE — Assessment & Plan Note (Signed)
She has limited neck movement due to recent surgery I recommend cancer rehab with physical therapy

## 2020-04-02 ENCOUNTER — Telehealth: Payer: Self-pay

## 2020-04-02 ENCOUNTER — Other Ambulatory Visit: Payer: Self-pay | Admitting: Hematology and Oncology

## 2020-04-02 DIAGNOSIS — M436 Torticollis: Secondary | ICD-10-CM

## 2020-04-02 NOTE — Telephone Encounter (Signed)
Called and given below message. Appt moved to Tuesday. She is aware of app time.

## 2020-04-02 NOTE — Telephone Encounter (Signed)
-----   Message from Heath Lark, MD sent at 04/02/2020  9:39 AM EST ----- Regarding: appt next week Looks like PET CT on Monday I can see her sooner than current appt: I can see her Tuesday from 830 to 9 am

## 2020-04-06 ENCOUNTER — Encounter
Admission: RE | Admit: 2020-04-06 | Discharge: 2020-04-06 | Disposition: A | Discharge status: Home / Self Care | Payer: Federal, State, Local not specified - PPO | Source: Ambulatory Visit | Attending: Hematology and Oncology | Admitting: Hematology and Oncology

## 2020-04-06 ENCOUNTER — Other Ambulatory Visit: Payer: Self-pay

## 2020-04-06 DIAGNOSIS — C8581 Other specified types of non-Hodgkin lymphoma, lymph nodes of head, face, and neck: Secondary | ICD-10-CM | POA: Insufficient documentation

## 2020-04-06 DIAGNOSIS — C884 Extranodal marginal zone B-cell lymphoma of mucosa-associated lymphoid tissue [MALT-lymphoma]: Secondary | ICD-10-CM | POA: Diagnosis not present

## 2020-04-06 LAB — GLUCOSE, CAPILLARY: Glucose-Capillary: 94 mg/dL (ref 70–99)

## 2020-04-06 MED ORDER — FLUDEOXYGLUCOSE F - 18 (FDG) INJECTION
9.4800 | Freq: Once | INTRAVENOUS | Status: AC | PRN
  Administered 2020-04-06: 9.48 via INTRAVENOUS

## 2020-04-07 ENCOUNTER — Ambulatory Visit: Payer: Federal, State, Local not specified - PPO | Admitting: Hematology and Oncology

## 2020-04-07 ENCOUNTER — Other Ambulatory Visit: Payer: Self-pay

## 2020-04-07 ENCOUNTER — Encounter: Payer: Self-pay | Admitting: Hematology and Oncology

## 2020-04-07 ENCOUNTER — Inpatient Hospital Stay: Payer: Federal, State, Local not specified - PPO | Admitting: Hematology and Oncology

## 2020-04-07 VITALS — BP 126/86 | HR 81 | Temp 97.8°F | Resp 20 | Ht 64.0 in | Wt 185.0 lb

## 2020-04-07 DIAGNOSIS — F410 Panic disorder [episodic paroxysmal anxiety] without agoraphobia: Secondary | ICD-10-CM | POA: Diagnosis not present

## 2020-04-07 DIAGNOSIS — M436 Torticollis: Secondary | ICD-10-CM

## 2020-04-07 DIAGNOSIS — E039 Hypothyroidism, unspecified: Secondary | ICD-10-CM

## 2020-04-07 DIAGNOSIS — C884 Extranodal marginal zone B-cell lymphoma of mucosa-associated lymphoid tissue [MALT-lymphoma]: Secondary | ICD-10-CM | POA: Diagnosis not present

## 2020-04-07 DIAGNOSIS — C8581 Other specified types of non-Hodgkin lymphoma, lymph nodes of head, face, and neck: Secondary | ICD-10-CM | POA: Diagnosis not present

## 2020-04-07 DIAGNOSIS — Z79899 Other long term (current) drug therapy: Secondary | ICD-10-CM | POA: Diagnosis not present

## 2020-04-07 DIAGNOSIS — Z90710 Acquired absence of both cervix and uterus: Secondary | ICD-10-CM | POA: Diagnosis not present

## 2020-04-07 DIAGNOSIS — D509 Iron deficiency anemia, unspecified: Secondary | ICD-10-CM | POA: Diagnosis not present

## 2020-04-07 NOTE — Assessment & Plan Note (Signed)
Imaging studies show signs of thyroiditis She is taking thyroid supplement No further work-up is needed at this point

## 2020-04-07 NOTE — Progress Notes (Signed)
[place] OFFICE PROGRESS NOTE  Patient Care Team: Horald Pollen, MD as PCP - General (Internal Medicine) Jarome Matin, MD as Consulting Physician (Dermatology) Horald Pollen, MD as Referring Physician (Internal Medicine)  ASSESSMENT & PLAN:  Marginal zone lymphoma of lymph nodes of neck [facility]) I reviewed multiple imaging studies with the patient and her husband She has no signs of residual disease She is cured with surgical excision alone and she does not need adjuvant treatment With early stage I disease, her long-term cure rate is very high I plan to see her once a year with history, physical examination and blood work only  Acquired hypothyroidism Imaging studies show signs of thyroiditis She is taking thyroid supplement No further work-up is needed at this point  Neck stiffness She is scheduled to see physical therapist for rehab after recent surgery   Orders Placed This Encounter  Procedures  . CBC with Differential/Platelet    Standing Status:   Standing    Number of Occurrences:   22    Standing Expiration Date:   04/07/2021  . Comprehensive metabolic panel    Standing Status:   Standing    Number of Occurrences:   33    Standing Expiration Date:   04/07/2021  . Lactate dehydrogenase    Standing Status:   Future    Standing Expiration Date:   04/07/2021    All questions were answered. The patient knows to call the clinic with any problems, questions or concerns. The total time spent in the appointment was 20 minutes encounter with patients including review of chart and various tests results, discussions about plan of care and coordination of care plan   Heath Lark, MD 04/07/2020 12:08 PM  INTERVAL HISTORY: Please see below for problem oriented charting. 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 Lymphoma, sarcoid, or IgG 4 related disease are considered given the infiltrative appearance. If a primary parotid neoplasm the extent of the mass would be concerning for aggressive disease. The lesion extends to the subcutaneous preauricular space and should be readily amenable to biopsy.   03/04/2020 Surgery   Procedure: Right superficial parotidectomy with dissection of facial nerve Surgeon: Redmond Baseman Anesth: General and local with 1% lidocaine with 1:100,000 epinephrine Findings: Right superior parotid with superficial mass dissected easily from nerve branches.   03/04/2020 Pathology Results   FINAL MICROSCOPIC DIAGNOSIS:   A. PAROTID, RIGHT, PAROTIDECTOMY:  - Atypical lymphoid population, see comment.  - Benign salivary gland tissue.   COMMENT:   There is effacement of the salivary gland tissue by a monotonous population of small lymphoid cells with moderate clear cytoplasm. There are lymphoepithelial type lesions. Immunohistochemistry reveals the atypical lymphoid cells are positive for CD20 and bcl-2. There are residual disrupted germinal centers (CD23, bcl-6, CD10-positive; bcl-2 negative). CD3, CD43, and CD5 reveal admixed T-cells. CyclinD1 is negative. Ki-67 is overall low and elevated in residual germinal centers. CD138 reveals only scattered plasma cells which appear polytypic by light chain in situ hybridization. 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 While the morphology is suspicious, there is no aberrant phenotype or monotypic  plasma cell population. Thus, PCR for B-cell gene rearrangement will be ordered to assess clonality (results to be reported in an  addendum).  ADDENDUM:   PCR for B-cell gene rearrangement is POSITIVE.   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    04/06/2020 PET scan   1. Low level FDG uptake in the right parotid bed compatible with the history of recent surgery. 2. No evidence for hypermetabolic metastatic disease in the neck, chest, abdomen, or pelvis. 3. Diffuse hypermetabolism in the thyroid parenchyma, likely inflammatory.   04/06/2020 Cancer Staging   Staging form: Hodgkin and Non-Hodgkin Lymphoma, AJCC 8th Edition - Clinical stage from 04/06/2020: Stage I (Marginal zone lymphoma) - Signed by Heath Lark, MD on 04/06/2020     REVIEW OF SYSTEMS:   Constitutional: Denies fevers, chills or abnormal weight loss Eyes: Denies blurriness of vision Ears, nose, mouth, throat, and face: Denies mucositis or sore throat Respiratory: Denies cough, dyspnea or wheezes Cardiovascular: Denies palpitation, chest discomfort or lower extremity swelling Gastrointestinal:  Denies nausea, heartburn or change in bowel habits Skin: Denies abnormal skin rashes Lymphatics: Denies new lymphadenopathy or easy bruising Neurological:Denies numbness, tingling or new weaknesses Behavioral/Psych: Mood is stable, no new changes  All other systems were reviewed with the patient and are negative.  I have reviewed the past medical history, past surgical history, social history and family history with the patient and they are unchanged from previous note.  ALLERGIES:  is allergic to contrave [naltrexone-bupropion hcl er] and oxycodone-acetaminophen er.  MEDICATIONS:  Current Outpatient Medications  Medication Sig Dispense Refill  . albuterol (PROVENTIL HFA;VENTOLIN HFA) 108 (90 Base) MCG/ACT inhaler Inhale 1-2 puffs into the lungs every 4 (four) hours as needed for wheezing or shortness of breath. 1 Inhaler  5  . Biotin 1000 MCG tablet Take 2,000 mcg by mouth daily.    Marland Kitchen HYDROcodone-acetaminophen (NORCO/VICODIN) 5-325 MG tablet Take 1-2 tablets by mouth every 6 (six) hours as needed for moderate pain. 12 tablet 0  . levothyroxine (SYNTHROID) 75 MCG tablet TAKE 1 TABLET (75 MCG TOTAL) BY MOUTH DAILY BEFORE BREAKFAST. 90 tablet 0  . nitrofurantoin, macrocrystal-monohydrate, (MACROBID) 100 MG capsule Take 100 mg by mouth 2 (two) times daily.    Marland Kitchen venlafaxine XR (EFFEXOR-XR) 75 MG 24 hr capsule TAKE 1 CAPSULE (75 MG TOTAL) BY MOUTH DAILY WITH BREAKFAST. 90 capsule 3   No current facility-administered medications for this visit.    PHYSICAL EXAMINATION: ECOG PERFORMANCE STATUS: 1 - Symptomatic but completely ambulatory  Vitals:   04/07/20 0834  BP: 126/86  Pulse: 81  Resp: 20  Temp: 97.8 F (36.6 C)  SpO2: 98%   Filed Weights   04/07/20 0834  Weight: 185 lb (83.9 kg)    GENERAL:alert, no distress and comfortable NEURO: alert & oriented x 3 with fluent speech, no focal motor/sensory deficits  LABORATORY DATA:  I have reviewed the data as listed    Component Value Date/Time   NA 138 03/04/2020 1229   NA 139 07/09/2016 1052   K 3.7 03/04/2020 1229   CL 105 03/04/2020 1229   CO2 22 03/04/2020 1229   GLUCOSE 87 03/04/2020 1229   BUN 9 03/04/2020 1229   BUN 7 07/09/2016 1052   CREATININE 0.68 03/04/2020 1229   CREATININE 0.95 04/20/2016 1632   CALCIUM 8.8 (L) 03/04/2020 1229   PROT 6.7 07/09/2016 1052   ALBUMIN 4.1 07/09/2016 1052  AST 18 07/09/2016 1052   ALT 14 07/09/2016 1052   ALKPHOS 81 07/09/2016 1052   BILITOT 0.4 07/09/2016 1052   GFRNONAA >60 03/04/2020 1229   GFRNONAA 74 04/20/2016 1632   GFRAA 121 07/09/2016 1052   GFRAA 85 04/20/2016 1632    No results found for: SPEP, UPEP  Lab Results  Component Value Date   WBC 7.6 03/04/2020   NEUTROABS 5,615 05/02/2018   HGB 14.6 03/04/2020   HCT 43.9 03/04/2020   MCV 97.1 03/04/2020   PLT 293 03/04/2020       Chemistry      Component Value Date/Time   NA 138 03/04/2020 1229   NA 139 07/09/2016 1052   K 3.7 03/04/2020 1229   CL 105 03/04/2020 1229   CO2 22 03/04/2020 1229   BUN 9 03/04/2020 1229   BUN 7 07/09/2016 1052   CREATININE 0.68 03/04/2020 1229   CREATININE 0.95 04/20/2016 1632      Component Value Date/Time   CALCIUM 8.8 (L) 03/04/2020 1229   ALKPHOS 81 07/09/2016 1052   AST 18 07/09/2016 1052   ALT 14 07/09/2016 1052   BILITOT 0.4 07/09/2016 1052       RADIOGRAPHIC STUDIES: I have reviewed multiple imaging studies with the patient and her husband I have personally reviewed the radiological images as listed and agreed with the findings in the report. NM PET Image Initial (PI) Skull Base To Thigh  Result Date: 04/06/2020 CLINICAL DATA:  Initial treatment strategy for marginal zone lymphoma of the right parotid gland. EXAM: NUCLEAR MEDICINE PET SKULL BASE TO THIGH TECHNIQUE: 9.5 mCi F-18 FDG was injected intravenously. Full-ring PET imaging was performed from the skull base to thigh after the radiotracer. CT data was obtained and used for attenuation correction and anatomic localization. Fasting blood glucose: 94 mg/dl COMPARISON:  None. FINDINGS: Mediastinal blood pool activity: SUV max 2.4 Liver activity: SUV max 3.0 NECK: Low level uptake in the right parotid bed is consistent with the recent surgery. No evidence for hypermetabolic lymphadenopathy. Diffuse hypermetabolism of the thyroid parenchyma evident without a discrete mass lesion evident by CT imaging. Incidental CT findings: none CHEST: No hypermetabolic mediastinal or hilar nodes. No suspicious pulmonary nodules on the CT scan. Incidental CT findings: No suspicious nodule or mass. ABDOMEN/PELVIS: No abnormal hypermetabolic activity within the liver, pancreas, adrenal glands, or spleen. No hypermetabolic lymph nodes in the abdomen or pelvis. Incidental CT findings: none SKELETON: No focal hypermetabolic activity to suggest  skeletal metastasis. Incidental CT findings: None. IMPRESSION: 1. Low level FDG uptake in the right parotid bed compatible with the history of recent surgery. 2. No evidence for hypermetabolic metastatic disease in the neck, chest, abdomen, or pelvis. 3. Diffuse hypermetabolism in the thyroid parenchyma, likely inflammatory. Electronically Signed   By: Misty Stanley M.D.   On: 04/06/2020 14:26

## 2020-04-07 NOTE — Assessment & Plan Note (Signed)
I reviewed multiple imaging studies with the patient and her husband She has no signs of residual disease She is cured with surgical excision alone and she does not need adjuvant treatment With early stage I disease, her long-term cure rate is very high I plan to see her once a year with history, physical examination and blood work only

## 2020-04-07 NOTE — Assessment & Plan Note (Signed)
She is scheduled to see physical therapist for rehab after recent surgery

## 2020-04-08 ENCOUNTER — Telehealth: Payer: Self-pay | Admitting: Hematology and Oncology

## 2020-04-08 NOTE — Telephone Encounter (Signed)
Scheduled appt per 11/23 sch msg - mailed letter with appt date and time

## 2020-04-10 ENCOUNTER — Ambulatory Visit: Payer: Federal, State, Local not specified - PPO | Admitting: Hematology and Oncology

## 2020-04-14 ENCOUNTER — Ambulatory Visit: Payer: Federal, State, Local not specified - PPO | Attending: Hematology and Oncology | Admitting: Rehabilitation

## 2020-04-27 ENCOUNTER — Encounter: Payer: Self-pay | Admitting: Emergency Medicine

## 2020-04-30 NOTE — Telephone Encounter (Signed)
Patient following up on calls and emails about UTI. Please advise asap at [phone]

## 2020-05-05 ENCOUNTER — Other Ambulatory Visit: Payer: Self-pay

## 2020-05-05 MED ORDER — LEVOTHYROXINE SODIUM 75 MCG PO TABS: 75.0000 ug | ORAL_TABLET | Freq: Every day | ORAL | 0 refills | Status: DC

## 2020-05-05 NOTE — Telephone Encounter (Signed)
Ok to refill 

## 2020-05-06 MED ORDER — LEVOTHYROXINE SODIUM 75 MCG PO TABS
75.0000 ug | ORAL_TABLET | Freq: Every day | ORAL | 0 refills | Status: DC
Start: 2020-05-06 — End: 2021-02-03

## 2020-07-02 DIAGNOSIS — Z1231 Encounter for screening mammogram for malignant neoplasm of breast: Secondary | ICD-10-CM | POA: Diagnosis not present

## 2020-07-02 LAB — HM MAMMOGRAPHY

## 2020-07-08 ENCOUNTER — Encounter: Payer: Self-pay | Admitting: *Deleted

## 2020-07-23 ENCOUNTER — Ambulatory Visit: Payer: Federal, State, Local not specified - PPO | Admitting: Emergency Medicine

## 2020-07-29 ENCOUNTER — Ambulatory Visit: Payer: Federal, State, Local not specified - PPO | Admitting: Emergency Medicine

## 2020-08-15 ENCOUNTER — Other Ambulatory Visit: Payer: Self-pay

## 2020-08-15 ENCOUNTER — Ambulatory Visit (HOSPITAL_COMMUNITY)
Admission: EM | Admit: 2020-08-15 | Discharge: 2020-08-15 | Discharge status: Against Medical Advice | Payer: Federal, State, Local not specified - PPO

## 2020-09-02 ENCOUNTER — Other Ambulatory Visit: Payer: Self-pay | Admitting: Endocrinology

## 2020-09-29 DIAGNOSIS — M21611 Bunion of right foot: Secondary | ICD-10-CM | POA: Diagnosis not present

## 2020-09-29 DIAGNOSIS — M25561 Pain in right knee: Secondary | ICD-10-CM | POA: Diagnosis not present

## 2020-09-29 DIAGNOSIS — M2242 Chondromalacia patellae, left knee: Secondary | ICD-10-CM | POA: Diagnosis not present

## 2020-10-06 ENCOUNTER — Ambulatory Visit: Payer: Federal, State, Local not specified - PPO | Admitting: Podiatry

## 2020-10-13 ENCOUNTER — Ambulatory Visit: Payer: Federal, State, Local not specified - PPO | Admitting: Podiatry

## 2020-10-20 ENCOUNTER — Ambulatory Visit (INDEPENDENT_AMBULATORY_CARE_PROVIDER_SITE_OTHER): Payer: Federal, State, Local not specified - PPO

## 2020-10-20 ENCOUNTER — Telehealth: Payer: Self-pay | Admitting: Urology

## 2020-10-20 ENCOUNTER — Other Ambulatory Visit: Payer: Self-pay

## 2020-10-20 ENCOUNTER — Ambulatory Visit: Payer: Federal, State, Local not specified - PPO | Admitting: Podiatry

## 2020-10-20 ENCOUNTER — Encounter: Payer: Self-pay | Admitting: Podiatry

## 2020-10-20 DIAGNOSIS — M19079 Primary osteoarthritis, unspecified ankle and foot: Secondary | ICD-10-CM

## 2020-10-20 DIAGNOSIS — M722 Plantar fascial fibromatosis: Secondary | ICD-10-CM

## 2020-10-20 DIAGNOSIS — M205X1 Other deformities of toe(s) (acquired), right foot: Secondary | ICD-10-CM

## 2020-10-20 NOTE — Progress Notes (Signed)
  Subjective:  Patient ID: Barbera Setters, female    DOB: [birth year],  MRN: [id number]  Chief Complaint  Patient presents with  . Bunions    Right foot - had xrays last week, brought disc - had left bunion fixed 3 or 4 years ago. Patient travel a lot for work and it has become quite painful    45 y.o. female presents with the above complaint. History confirmed with patient.  Left foot was fixed by a podiatrist at [facility].  The foot is painful when walking and mostly in the joint as well as on the bump.  Painful in shoe gear.  She often likes to wear high heels.  She brought x-rays today today that were taken at [place] she has 1 to see  Objective:  Physical Exam: warm, good capillary refill, no trophic changes or ulcerative lesions, normal DP and PT pulses and normal sensory exam. Left Foot: Well-healed surgical scar Right Foot: She has hallux valgus with palpable dorsal spurring hallux limitus on the dorsal and lateral portions of the joint.  Large bunion.  Pain with end range of motio which is limited n.   Radiographs: X-ray of the right foot: Previous radiographs reviewed she has moderate hallux valgus with dorsal spurring and loose body in the joint with possible previous ununited phalangeal base fracture Assessment:   1. Plantar fasciitis, bilateral   2. Osteoarthritis of first metatarsophalangeal joint   3. Hallux limitus of right foot      Plan:  Patient was evaluated and treated and all questions answered.  I reviewed the radiographic and clinical findings with the patient in detail.  We discussed correction of painful bunions.  We discussed the presence of arthritis that appears to be present in her joint as well as her limited range of motion.  I recommended a first metatarsophalangeal joint arthrodesis in order to correct both the bunion and the arthritis simultaneously.  Discussed with her that correcting one of the distal osteotomy likely could  lead to continued pain with the osteoarthritis.  She says it is important for her to wear high heels and she would like to have a joint fusion.  I think at her age she joint implant would not be advisable.  We discussed the risk, benefits and potential complications of bunion correction including but not limited to pain, swelling, infection, scar, numbness which may be temporary or permanent, chronic pain, stiffness, nerve pain or damage, wound healing problems, bone healing problems including delayed or non-union.  Informed consent was signed and reviewed.  Surgery be scheduled a mutually agreeable date.  With the presence of the dorsal spurring and loose bodies in the joint I think an open approach with a Kalish osteotomy would be advisable and we will plan for this.   Surgical plan:  Procedure: -Right foot Jeralene Huff bunionectomy  Location: -[facility]  Anesthesia plan: -IV sedation with local anesthesia  Postoperative pain plan: - Tylenol 1000 mg every 6 hours, ibuprofen 600 mg every 6 hours, gabapentin 300 mg every 8 hours x5 days, oxycodone 5 mg 1-2 tabs every 6 hours only as needed  DVT prophylaxis: -None required  WB Restrictions / DME needs: -WBAT in short CAM boot which I dispensed today    No follow-ups on file.

## 2020-10-20 NOTE — Telephone Encounter (Signed)
DOS - 11/06/20  [place] BUNIONECTOMY RIGHT --- [id number]   [employer] EFFECTIVE DATE - 02/15/03   PLAN DEDUCTIBLE - $350.00 W/ $40.00 REMAINING OUT OF POCKET - $6,000.00 W/ $5,597.00 REMAINING COINSURANCE - 0% COPAY - $0.00   NO PRIOR AUTH REQUIRED

## 2020-10-29 ENCOUNTER — Encounter: Payer: Self-pay | Admitting: Emergency Medicine

## 2020-10-30 NOTE — Telephone Encounter (Signed)
Chart says she has been dismissed from practice. Not sure what happened but under the circumstances I will not be able to take care of this problem. Thanks.

## 2020-11-02 NOTE — Telephone Encounter (Signed)
Sent pt a mychart message to advise her to set up an appointment to discuss symptoms.

## 2020-11-02 NOTE — Telephone Encounter (Signed)
I attempted to call the patient to let her know it looks like she has been dismissed from the practice and that she will might need to call [facility] to see if she can get an appointment. LVM.

## 2020-11-06 ENCOUNTER — Other Ambulatory Visit: Payer: Self-pay | Admitting: Podiatry

## 2020-11-06 ENCOUNTER — Encounter: Payer: Self-pay | Admitting: Podiatry

## 2020-11-06 DIAGNOSIS — M19071 Primary osteoarthritis, right ankle and foot: Secondary | ICD-10-CM | POA: Diagnosis not present

## 2020-11-06 DIAGNOSIS — M2011 Hallux valgus (acquired), right foot: Secondary | ICD-10-CM | POA: Diagnosis not present

## 2020-11-06 DIAGNOSIS — M25571 Pain in right ankle and joints of right foot: Secondary | ICD-10-CM | POA: Diagnosis not present

## 2020-11-06 DIAGNOSIS — M2012 Hallux valgus (acquired), left foot: Secondary | ICD-10-CM | POA: Diagnosis not present

## 2020-11-06 MED ORDER — ACETAMINOPHEN 500 MG PO TABS: 1000.0000 mg | ORAL_TABLET | Freq: Three times a day (TID) | ORAL | 0 refills | Status: AC | PRN

## 2020-11-06 MED ORDER — IBUPROFEN 600 MG PO TABS: 600.0000 mg | ORAL_TABLET | Freq: Four times a day (QID) | ORAL | 0 refills | Status: AC | PRN

## 2020-11-06 MED ORDER — HYDROCODONE-ACETAMINOPHEN 5-325 MG PO TABS: 1.0000 | ORAL_TABLET | Freq: Four times a day (QID) | ORAL | 0 refills | Status: AC | PRN

## 2020-11-06 MED ORDER — GABAPENTIN 300 MG PO CAPS: 300.0000 mg | ORAL_CAPSULE | Freq: Three times a day (TID) | ORAL | 0 refills | Status: DC

## 2020-11-12 ENCOUNTER — Encounter: Payer: Self-pay | Admitting: Podiatry

## 2020-11-12 ENCOUNTER — Ambulatory Visit (INDEPENDENT_AMBULATORY_CARE_PROVIDER_SITE_OTHER): Payer: Federal, State, Local not specified - PPO

## 2020-11-12 ENCOUNTER — Other Ambulatory Visit: Payer: Self-pay

## 2020-11-12 ENCOUNTER — Ambulatory Visit (INDEPENDENT_AMBULATORY_CARE_PROVIDER_SITE_OTHER): Payer: Federal, State, Local not specified - PPO | Admitting: Podiatry

## 2020-11-12 DIAGNOSIS — M2011 Hallux valgus (acquired), right foot: Secondary | ICD-10-CM

## 2020-11-12 DIAGNOSIS — M19079 Primary osteoarthritis, unspecified ankle and foot: Secondary | ICD-10-CM

## 2020-11-12 DIAGNOSIS — M21611 Bunion of right foot: Secondary | ICD-10-CM

## 2020-11-12 DIAGNOSIS — M19071 Primary osteoarthritis, right ankle and foot: Secondary | ICD-10-CM

## 2020-11-12 NOTE — Progress Notes (Signed)
  Subjective:  Patient ID: Sydney Vaughn, female    DOB: 05-Oct-1975,  MRN: [id number]  Chief Complaint  Patient presents with   Routine Post Op    (xray)POV #1 DOS 11/06/2020 [facility] BUNIONECTOMY RT    45 y.o. female returns for post-op check.  Doing well minimal pain  Review of Systems: Negative except as noted in the HPI. Denies N/V/F/Ch.   Objective:  There were no vitals filed for this visit. There is no height or weight on file to calculate BMI. Constitutional Well developed. Well nourished.  Vascular Foot warm and well perfused. Capillary refill normal to all digits.   Neurologic Normal speech. Oriented to person, place, and time. Epicritic sensation to light touch grossly present bilaterally.  Dermatologic Skin healing well without signs of infection. Skin edges well coapted without signs of infection.  Orthopedic: Tenderness to palpation noted about the surgical site.   Multiple view plain film radiographs: Status post bunionectomy with 2 screw fixation Assessment:   1. Osteoarthritis of first metatarsophalangeal joint   2. Hallux valgus with bunions, right    Plan:  Patient was evaluated and treated and all questions answered.  S/p foot surgery right -Progressing as expected post-operatively. -XR: As above -WB Status: WBAT in short CAM boot -Sutures: New Steri-Strips applied she may begin bathing next week and reapply Steri-Strips.  Remove suture knots next visit -Medications: None required -Foot redressed.  Return in about 2 weeks (around 11/26/2020) for post op (no x-rays), suture removal.

## 2020-11-18 DIAGNOSIS — Z20822 Contact with and (suspected) exposure to covid-19: Secondary | ICD-10-CM | POA: Diagnosis not present

## 2020-11-18 DIAGNOSIS — J0140 Acute pansinusitis, unspecified: Secondary | ICD-10-CM | POA: Diagnosis not present

## 2020-11-18 DIAGNOSIS — Z03818 Encounter for observation for suspected exposure to other biological agents ruled out: Secondary | ICD-10-CM | POA: Diagnosis not present

## 2020-11-18 DIAGNOSIS — B379 Candidiasis, unspecified: Secondary | ICD-10-CM | POA: Diagnosis not present

## 2020-11-26 ENCOUNTER — Encounter: Payer: Self-pay | Admitting: Podiatry

## 2020-11-26 ENCOUNTER — Other Ambulatory Visit: Payer: Self-pay

## 2020-11-26 ENCOUNTER — Ambulatory Visit (INDEPENDENT_AMBULATORY_CARE_PROVIDER_SITE_OTHER): Payer: Federal, State, Local not specified - PPO | Admitting: Podiatry

## 2020-11-26 DIAGNOSIS — Z9889 Other specified postprocedural states: Secondary | ICD-10-CM

## 2020-11-26 DIAGNOSIS — M21611 Bunion of right foot: Secondary | ICD-10-CM

## 2020-11-26 DIAGNOSIS — M2011 Hallux valgus (acquired), right foot: Secondary | ICD-10-CM

## 2020-11-26 DIAGNOSIS — M19079 Primary osteoarthritis, unspecified ankle and foot: Secondary | ICD-10-CM

## 2020-11-26 NOTE — Progress Notes (Signed)
  Subjective:  Patient ID: Sydney Vaughn, female    DOB: 24-May-1975,  MRN: [id number]  Chief Complaint  Patient presents with   Routine Post Op      POV #2 DOS 11/06/2020 Altamese Chilton RT    45 y.o. female returns for post-op check.  She continues to do well  Review of Systems: Negative except as noted in the HPI. Denies N/V/F/Ch.   Objective:  There were no vitals filed for this visit. There is no height or weight on file to calculate BMI. Constitutional Well developed. Well nourished.  Vascular Foot warm and well perfused. Capillary refill normal to all digits.   Neurologic Normal speech. Oriented to person, place, and time. Epicritic sensation to light touch grossly present bilaterally.  Dermatologic Skin healing well without signs of infection. Skin edges well coapted without signs of infection.  Orthopedic: Tenderness to palpation noted about the surgical site.   Multiple view plain film radiographs: Status post bunionectomy with 2 screw fixation Assessment:   1. Osteoarthritis of first metatarsophalangeal joint   2. Hallux valgus with bunions, right   3. Post-operative state     Plan:  Patient was evaluated and treated and all questions answered.  S/p foot surgery right -Incision healing very well suture knots removed and I gave her Steri-Strips to apply if she has any gapping of the incision. -Compression sleeve and bunion spacer dispensed wear this when walking -Surgical shoe dispensed.  Wear this when walking can discontinue boot at this point.   -Reviewed range of motion exercises -Follow-up in 3 weeks for postop visit with new x-rays  Return in about 3 weeks (around 12/17/2020) for post op (new x-rays).

## 2020-12-08 DIAGNOSIS — N760 Acute vaginitis: Secondary | ICD-10-CM | POA: Diagnosis not present

## 2020-12-08 DIAGNOSIS — R81 Glycosuria: Secondary | ICD-10-CM | POA: Diagnosis not present

## 2020-12-08 DIAGNOSIS — R3129 Other microscopic hematuria: Secondary | ICD-10-CM | POA: Diagnosis not present

## 2020-12-08 DIAGNOSIS — E039 Hypothyroidism, unspecified: Secondary | ICD-10-CM | POA: Diagnosis not present

## 2020-12-11 ENCOUNTER — Other Ambulatory Visit: Payer: Self-pay | Admitting: Endocrinology

## 2020-12-17 ENCOUNTER — Encounter: Payer: Federal, State, Local not specified - PPO | Admitting: Podiatry

## 2020-12-22 ENCOUNTER — Ambulatory Visit (INDEPENDENT_AMBULATORY_CARE_PROVIDER_SITE_OTHER): Payer: Federal, State, Local not specified - PPO | Admitting: Podiatry

## 2020-12-22 ENCOUNTER — Ambulatory Visit (INDEPENDENT_AMBULATORY_CARE_PROVIDER_SITE_OTHER): Payer: Federal, State, Local not specified - PPO

## 2020-12-22 ENCOUNTER — Other Ambulatory Visit: Payer: Self-pay

## 2020-12-22 DIAGNOSIS — M2011 Hallux valgus (acquired), right foot: Secondary | ICD-10-CM

## 2020-12-22 DIAGNOSIS — M21611 Bunion of right foot: Secondary | ICD-10-CM

## 2020-12-22 DIAGNOSIS — M19079 Primary osteoarthritis, unspecified ankle and foot: Secondary | ICD-10-CM

## 2020-12-22 NOTE — Progress Notes (Signed)
  Subjective:  Patient ID: Sydney Vaughn, female    DOB: 1975/07/28,  MRN: [id number]  Chief Complaint  Patient presents with   Routine Post Op    POV #3 DOS 11/06/2020 [facility] BUNIONECTOMY RT /Patient states there is no constant pain but she if feeling numbness in her toes    45 y.o. female returns for post-op check.  She continues to do well, her pain is improving the surgical shoe is been helpful she feels like the toe is stiff  Review of Systems: Negative except as noted in the HPI. Denies N/V/F/Ch.   Objective:  There were no vitals filed for this visit. There is no height or weight on file to calculate BMI. Constitutional Well developed. Well nourished.  Vascular Foot warm and well perfused. Capillary refill normal to all digits.   Neurologic Normal speech. Oriented to person, place, and time. Epicritic sensation to light touch grossly present bilaterally.  Dermatologic Well-healed surgical scar  Orthopedic: Tenderness to palpation noted about the surgical site.  First MTP ROM is limited   Multiple view plain film radiographs: New radiographs taken today show early consolidation across the osteotomy site Assessment:   1. Hallux valgus with bunions, right     Plan:  Patient was evaluated and treated and all questions answered.  S/p foot surgery right -Overall doing well she can transition to regular shoe gear as tolerated.  Follow-up next visit with new x-rays.  Referral to PT sent for range of motion and to decrease stiffness in the first MTP to [facility] in [place]. -Reviewed the x-rays and discussed that she does still have some arthritic changes which the surgery was not designed to address and this still could be an issue for her down the road at some point.  Return in about 7 weeks (around 02/09/2021) for post op (new x-rays).

## 2021-01-13 DIAGNOSIS — H6993 Unspecified Eustachian tube disorder, bilateral: Secondary | ICD-10-CM | POA: Diagnosis not present

## 2021-01-13 DIAGNOSIS — R309 Painful micturition, unspecified: Secondary | ICD-10-CM | POA: Diagnosis not present

## 2021-01-13 DIAGNOSIS — R8290 Unspecified abnormal findings in urine: Secondary | ICD-10-CM | POA: Diagnosis not present

## 2021-01-14 ENCOUNTER — Other Ambulatory Visit: Payer: Self-pay | Admitting: Endocrinology

## 2021-02-02 ENCOUNTER — Other Ambulatory Visit (INDEPENDENT_AMBULATORY_CARE_PROVIDER_SITE_OTHER): Payer: Federal, State, Local not specified - PPO

## 2021-02-02 ENCOUNTER — Other Ambulatory Visit: Payer: Self-pay

## 2021-02-02 ENCOUNTER — Other Ambulatory Visit: Payer: Self-pay | Admitting: Endocrinology

## 2021-02-02 DIAGNOSIS — Z131 Encounter for screening for diabetes mellitus: Secondary | ICD-10-CM | POA: Diagnosis not present

## 2021-02-02 DIAGNOSIS — Z13220 Encounter for screening for lipoid disorders: Secondary | ICD-10-CM

## 2021-02-02 DIAGNOSIS — E063 Autoimmune thyroiditis: Secondary | ICD-10-CM | POA: Diagnosis not present

## 2021-02-02 LAB — LIPID PANEL
Cholesterol: 170 mg/dL (ref 0–200)
HDL: 45.4 mg/dL (ref >39.00)
LDL Cholesterol: 104 mg/dL — ABNORMAL HIGH (ref 0–99)
NonHDL: 124.81
Total CHOL/HDL Ratio: 4
Triglycerides: 105 mg/dL (ref 0.0–149.0)
VLDL: 21 mg/dL (ref 0.0–40.0)

## 2021-02-02 LAB — HEMOGLOBIN A1C: Hgb A1c MFr Bld: 5.3 % (ref 4.6–6.5)

## 2021-02-02 LAB — T4, FREE: Free T4: 0.85 ng/dL (ref 0.60–1.60)

## 2021-02-02 LAB — TSH: TSH: 0.35 u[IU]/mL (ref 0.35–5.50)

## 2021-02-03 ENCOUNTER — Other Ambulatory Visit: Payer: Self-pay

## 2021-02-03 ENCOUNTER — Ambulatory Visit: Payer: Federal, State, Local not specified - PPO | Admitting: Endocrinology

## 2021-02-03 ENCOUNTER — Encounter: Payer: Self-pay | Admitting: Endocrinology

## 2021-02-03 VITALS — BP 118/78 | HR 77 | Ht 64.0 in | Wt 193.0 lb

## 2021-02-03 DIAGNOSIS — E669 Obesity, unspecified: Secondary | ICD-10-CM | POA: Diagnosis not present

## 2021-02-03 DIAGNOSIS — G933 Postviral fatigue syndrome: Secondary | ICD-10-CM | POA: Diagnosis not present

## 2021-02-03 DIAGNOSIS — Z6832 Body mass index (BMI) 32.0-32.9, adult: Secondary | ICD-10-CM

## 2021-02-03 DIAGNOSIS — E063 Autoimmune thyroiditis: Secondary | ICD-10-CM

## 2021-02-03 DIAGNOSIS — E7800 Pure hypercholesterolemia, unspecified: Secondary | ICD-10-CM

## 2021-02-03 DIAGNOSIS — G9331 Postviral fatigue syndrome: Secondary | ICD-10-CM

## 2021-02-03 MED ORDER — LEVOTHYROXINE SODIUM 88 MCG PO TABS: 88.0000 ug | ORAL_TABLET | Freq: Every day | ORAL | 2 refills | Status: DC

## 2021-02-03 MED ORDER — PHENTERMINE HCL 15 MG PO CAPS: 15.0000 mg | ORAL_CAPSULE | ORAL | 2 refills | Status: DC

## 2021-02-03 NOTE — Progress Notes (Signed)
Patient ID: Sydney Vaughn, female   DOB: 07-09-1975, 45 y.o.   MRN: [id number]             Reason for Appointment: Endocrinology follow-up    History of Present Illness:   WEIGHT management:  Previous history: She thinks her weight had been in the 170s a couple of years ago and has gained weight which she cannot shed Patient said that she has seen a dietitian in 2017 and is trying to modify her diet with taking salads for lunch, avoiding fried food usually She is trying to avoid snacking, trying to bake  foods as well as usually trying to avoid high-fat meats and avoiding sweets as well.  However she is usually trying to prepare food for her family with 4 children.   Her weight was about the same in July 2017  Since she had expressed difficulty with weight loss on her own with diet and exercise she was started on phentermine and Topamax on her initial visit in 07/2016   On her initial follow-up she had lost 18 pounds and had no side effects with the 2 drugs  In 2019 her Topamax was stopped because of her feeling that it was causing memory difficulties Also having periodic rapid heartbeat with phentermine low doses She was tried on Contrave and she had nausea and dizziness with the first tablet  RECENT history:  Previously in 2/21 she was having a rash with itching Topamax was stopped by her PCP  No side effects with phentermine which she has not been taking it for several months She has not been seen since 4/21 when she had a video visit  Previously was able to watch her diet better with phentermine alone and had no increased heart rate with this  She thinks she has lost another 4 pounds since February when she had already gone down 7 pounds  She has exercise equipment at home and is trying to walk also  In the last year she has gained about 8 pounds  Currently does not have any insurance coverage for weight loss medications  Wt Readings from Last 3 Encounters:  02/03/21 193  lb (87.5 kg)  04/07/20 185 lb (83.9 kg)  04/01/20 183 lb 12.8 oz (83.4 kg)    Hypothyroidism was first diagnosed in 11/2015  At the time of diagnosis patient was having symptoms of fatigue, weight gain and hair loss. She apparently has been having symptoms for several years the same way She has not had problems with cold intolerance except sometimes her feet get cold Baseline TSH was 7.7 and she was started on levothyroxine 25 g daily  On her initial consultation she was having symptoms of fatigue, having difficulty losing weight, fatigue on exercising and some hair loss although not as much, this is despite her taking the levothyroxine Also her thyroid functions including free T3 were normal  Recent history:  Does not complain of fatigue She has been consistent with taking her thyroid supplement soon after waking up  Since 04/2019 she is taking 75 mcg In 2021 with her TSH being high normal she was told to take an extra half tablet weekly because of fatigue which seemed to improve  She thinks that she has had more fatigue since she had COVID in June also Also has some chronic insomnia However is able to exercise   Thyroid function results have been as follows:  Lab Results  Component Value Date   TSH 0.35 02/02/2021   TSH  3.63 08/21/2019   TSH 4.09 04/08/2019   FREET4 0.85 02/02/2021   FREET4 0.79 08/21/2019   FREET4 0.83 04/08/2019      Past Medical History:  Diagnosis Date   Adult acne    ANA positive    Anemia    iron infusions   Asthma    Chronic sinusitis    CT and MRI.    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   Fibromyalgia    Foot fracture, left 08/2012   History of appendicitis 09/2006   History of blood transfusion    last time 02/2018 hysterectomy surgery   Hypothyroidism    Intramural uterine fibroid 2019   Iron deficiency    Menorrhagia    Metatarsal fracture 10/2012   Right Proximal fifth metatarsal fracture   Obesity    Panic  attack    at bedtime, awakes from sleep   Pelvic pain    resolved   Polyarthralgia    was seeing rheumatology   PONV (postoperative nausea and vomiting)     Past Surgical History:  Procedure Laterality Date   ABDOMINAL HYSTERECTOMY  04/2018   APPENDECTOMY     BUNIONECTOMY Left    DILATION AND EVACUATION     PAROTIDECTOMY Right 03/04/2020   Procedure: right superficial PAROTIDECTOMY, with nerve preservation;  Surgeon: Melida Quitter, MD;  Location: [facility];  Service: ENT;  Laterality: Right;   TUBAL LIGATION      Family History  Problem Relation Age of Onset   Thyroid disease Mother    Sinusitis Sister    Sinusitis Brother    Lupus Daughter        [facility]   Allergies Daughter     Social History:  reports that she has never smoked. She has never used smokeless tobacco. She reports that she does not currently use alcohol. She reports that she does not use drugs.  Allergies:   Allergies  Allergen Reactions   Contrave [Naltrexone-Bupropion Hcl Er] Nausea Only    Nausea and dizziness   Oxycodone-Acetaminophen Er     Swollen face, eyes and caused itching     Allergies as of 02/03/2021       Reactions   Contrave [naltrexone-bupropion Hcl Er] Nausea Only   Nausea and dizziness   Oxycodone-acetaminophen Er    Swollen face, eyes and caused itching         Medication List        Accurate as of February 03, 2021 11:05 AM. If you have any questions, ask your nurse or doctor.          albuterol 108 (90 Base) MCG/ACT inhaler Commonly known as: VENTOLIN HFA Inhale 1-2 puffs into the lungs every 4 (four) hours as needed for wheezing or shortness of breath.   Biotin 1000 MCG tablet Take 2,000 mcg by mouth daily.   gabapentin 300 MG capsule Commonly known as: NEURONTIN Take 1 capsule (300 mg total) by mouth 3 (three) times daily for 7 days.   levothyroxine 75 MCG tablet Commonly known as: SYNTHROID Take 1 tablet (75 mcg total) by mouth daily before  breakfast. Please make follow-up appointment   levothyroxine 75 MCG tablet Commonly known as: SYNTHROID TAKE 1 TABLET BY MOUTH DAILY BEFORE BREAKFAST.   nitrofurantoin (macrocrystal-monohydrate) 100 MG capsule Commonly known as: MACROBID Take 100 mg by mouth 2 (two) times daily.   venlafaxine XR 75 MG 24 hr capsule Commonly known as: EFFEXOR-XR TAKE 1 CAPSULE (75 MG  TOTAL) BY MOUTH DAILY WITH BREAKFAST.          Review of Systems      She has been taking venlafaxine for depression and anxiety long-term  Recently has had some stress issues  No history of diabetes, fasting lab glucose in 8/22 was below 100, labs not available currently  Has borderline lipid levels  Lab Results  Component Value Date   HGBA1C 5.3 02/02/2021   HGBA1C 5.6 12/02/2015   HGBA1C 5.1 12/25/2012   Lab Results  Component Value Date   MICROALBUR 0.8 02/03/2016   LDLCALC 104 (H) 02/02/2021   CREATININE 0.68 03/04/2020            Examination:    BP 118/78   Pulse 77   Ht 5' 4"  (1.626 m)   Wt 193 lb (87.5 kg)   LMP 04/14/2018   SpO2 98%   BMI 33.13 kg/m   Thyroid not palpable No tremor Reflexes are slightly brisk  Assessment:  OBESITY:  Her highest BMI has been about 36 at baseline  She previously had done significantly better with weight loss using phentermine  She thinks that she is doing well with her diet and not exercising as much as tolerated   She is showing some gradual weight gain over the last year and is concerned about it  Currently has had no metabolic effects such as increase in glucose or lipids or blood pressure  No side effects with phentermine low-dose and we will start her on 15 mg again   HYPOTHYROIDISM, mild with highest TSH 7.7  She has fatigue but this is likely unrelated Her TSH is low normal at 0.35  She is taking more than the prescribed dose of 7-1/2 pills a week of the 75 mcg currently but no signs of over replacement However has history of  anxiety  LDL is borderline and simply needs consistent diet  PLAN:   Consistent exercise regimen, phentermine 15 mg prescribed She will go down to 88 mcg levothyroxine once a day She will need to discuss her fatigue with her PCP Reassured her that her diabetes tests are all good   Follow-up in about 2 months  Elayne Snare 02/03/2021, 11:05 AM     Note: This office note was prepared with Dragon voice recognition system technology. Any transcriptional errors that result from this process are unintentional.

## 2021-02-09 ENCOUNTER — Encounter: Payer: Federal, State, Local not specified - PPO | Admitting: Podiatry

## 2021-04-05 ENCOUNTER — Other Ambulatory Visit: Payer: Self-pay | Admitting: Hematology and Oncology

## 2021-04-05 ENCOUNTER — Other Ambulatory Visit: Payer: Self-pay | Admitting: Endocrinology

## 2021-04-05 ENCOUNTER — Other Ambulatory Visit: Payer: Federal, State, Local not specified - PPO

## 2021-04-05 DIAGNOSIS — E063 Autoimmune thyroiditis: Secondary | ICD-10-CM

## 2021-04-06 ENCOUNTER — Inpatient Hospital Stay: Payer: Federal, State, Local not specified - PPO | Attending: Hematology and Oncology

## 2021-04-06 ENCOUNTER — Other Ambulatory Visit: Payer: Self-pay

## 2021-04-06 ENCOUNTER — Telehealth: Payer: Self-pay

## 2021-04-06 ENCOUNTER — Inpatient Hospital Stay: Payer: Federal, State, Local not specified - PPO | Admitting: Hematology and Oncology

## 2021-04-06 ENCOUNTER — Encounter: Payer: Self-pay | Admitting: Hematology and Oncology

## 2021-04-06 DIAGNOSIS — C8581 Other specified types of non-Hodgkin lymphoma, lymph nodes of head, face, and neck: Secondary | ICD-10-CM | POA: Diagnosis not present

## 2021-04-06 DIAGNOSIS — Z8572 Personal history of non-Hodgkin lymphomas: Secondary | ICD-10-CM | POA: Diagnosis not present

## 2021-04-06 DIAGNOSIS — M436 Torticollis: Secondary | ICD-10-CM | POA: Diagnosis not present

## 2021-04-06 DIAGNOSIS — Z79899 Other long term (current) drug therapy: Secondary | ICD-10-CM | POA: Diagnosis not present

## 2021-04-06 DIAGNOSIS — E039 Hypothyroidism, unspecified: Secondary | ICD-10-CM

## 2021-04-06 DIAGNOSIS — R519 Headache, unspecified: Secondary | ICD-10-CM | POA: Insufficient documentation

## 2021-04-06 LAB — COMPREHENSIVE METABOLIC PANEL
ALT: 14 U/L (ref 0–44)
AST: 16 U/L (ref 15–41)
Albumin: 4.2 g/dL (ref 3.5–5.0)
Alkaline Phosphatase: 94 U/L (ref 38–126)
Anion gap: 9 (ref 5–15)
BUN: 11 mg/dL (ref 6–20)
CO2: 25 mmol/L (ref 22–32)
Calcium: 8.8 mg/dL — ABNORMAL LOW (ref 8.9–10.3)
Chloride: 108 mmol/L (ref 98–111)
Creatinine, Ser: 0.86 mg/dL (ref 0.44–1.00)
GFR, Estimated: >60 mL/min (ref >60)
Glucose, Bld: 104 mg/dL — ABNORMAL HIGH (ref 70–99)
Potassium: 4.5 mmol/L (ref 3.5–5.1)
Sodium: 142 mmol/L (ref 135–145)
Total Bilirubin: 0.4 mg/dL (ref 0.3–1.2)
Total Protein: 7.7 g/dL (ref 6.5–8.1)

## 2021-04-06 LAB — CBC WITH DIFFERENTIAL/PLATELET
Abs Immature Granulocytes: 0.02 10*3/uL (ref 0.00–0.07)
Basophils Absolute: 0.1 10*3/uL (ref 0.0–0.1)
Basophils Relative: 1 %
Eosinophils Absolute: 0.2 10*3/uL (ref 0.0–0.5)
Eosinophils Relative: 3 %
HCT: 43.4 % (ref 36.0–46.0)
Hemoglobin: 14.8 g/dL (ref 12.0–15.0)
Immature Granulocytes: 0 %
Lymphocytes Relative: 28 %
Lymphs Abs: 2 10*3/uL (ref 0.7–4.0)
MCH: 31.9 pg (ref 26.0–34.0)
MCHC: 34.1 g/dL (ref 30.0–36.0)
MCV: 93.5 fL (ref 80.0–100.0)
Monocytes Absolute: 0.6 10*3/uL (ref 0.1–1.0)
Monocytes Relative: 8 %
Neutro Abs: 4.4 10*3/uL (ref 1.7–7.7)
Neutrophils Relative %: 60 %
Platelets: 309 10*3/uL (ref 150–400)
RBC: 4.64 MIL/uL (ref 3.87–5.11)
RDW: 12.6 % (ref 11.5–15.5)
WBC: 7.4 10*3/uL (ref 4.0–10.5)
nRBC: 0 % (ref 0.0–0.2)

## 2021-04-06 LAB — TSH: TSH: 3.591 u[IU]/mL (ref 0.308–3.960)

## 2021-04-06 NOTE — Telephone Encounter (Signed)
Called patient to inform her of her recent lab results. Patient verbalized an understanding of the information. No further questions.

## 2021-04-06 NOTE — Assessment & Plan Note (Signed)
She has intermittent headaches and discomfort at the surgical site which is not unusual Observe closely for now

## 2021-04-06 NOTE — Progress Notes (Signed)
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   All questions were answered. The patient knows to call the clinic with any problems, questions or concerns. The total time spent in the appointment was 20 minutes encounter with patients including review of chart and various tests results, discussions about plan of care and coordination of care plan   Heath Lark, MD 04/06/2021 10:55 AM  INTERVAL HISTORY: Please see below for problem oriented charting. she returns for surveillance follow-up on history of lymphoma status postresection without adjuvant treatment The patient was recently separated and is undergoing a lot of stress She has intermittent sharp discomfort and headaches on the site of her surgery No new lymphadenopathy or recent infection She is attempting to lose some weight She complains of fatigue  REVIEW OF SYSTEMS:   Constitutional: Denies fevers, chills or abnormal weight loss Eyes: Denies blurriness of vision Ears, nose, mouth, throat, and face: Denies mucositis or sore throat Respiratory: Denies cough, dyspnea or wheezes Cardiovascular: Denies palpitation, chest discomfort or lower extremity swelling Gastrointestinal:   Denies nausea, heartburn or change in bowel habits Skin: Denies abnormal skin rashes Lymphatics: Denies new lymphadenopathy or easy bruising Neurological:Denies numbness, tingling or new weaknesses Behavioral/Psych: Mood is stable, no new changes  All other systems were reviewed with the patient and are negative.  I have reviewed the past medical history, past surgical history, social history and family history with the patient and they are unchanged from previous note.  ALLERGIES:  is allergic to contrave [naltrexone-bupropion hcl er] and oxycodone-acetaminophen er.  MEDICATIONS:  Current Outpatient Medications  Medication Sig Dispense Refill   albuterol (PROVENTIL HFA;VENTOLIN HFA) 108 (90 Base) MCG/ACT inhaler Inhale 1-2 puffs into the lungs every 4 (four) hours as needed for wheezing or shortness of breath. (Patient not taking: Reported on 02/03/2021) 1 Inhaler 5   Biotin 1000 MCG tablet Take 2,000 mcg by mouth daily. (Patient not taking: Reported on 02/03/2021)     levothyroxine (SYNTHROID) 88 MCG tablet Take 1 tablet (88 mcg total) by mouth daily before breakfast. 30 tablet 2   phentermine 15 MG capsule Take 1 capsule (15 mg total) by mouth every morning. 30 capsule 2   venlafaxine XR (EFFEXOR-XR) 75 MG 24 hr capsule TAKE 1 CAPSULE (75 MG TOTAL) BY MOUTH DAILY WITH BREAKFAST. 90 capsule 3   No current facility-administered medications for this visit.    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 Lymphoma, sarcoid, or IgG 4 related disease are considered given the infiltrative appearance. If a primary parotid neoplasm the extent of the mass would be concerning for aggressive disease. The lesion extends to the subcutaneous preauricular space and should be  readily amenable to biopsy.   03/04/2020 Surgery   Procedure: Right superficial  parotidectomy with dissection of facial nerve Surgeon: Redmond Baseman Anesth: General and local with 1% lidocaine with 1:100,000 epinephrine Findings: Right superior parotid with superficial mass dissected easily from nerve branches.   03/04/2020 Pathology Results   FINAL MICROSCOPIC DIAGNOSIS:   A. PAROTID, RIGHT, PAROTIDECTOMY:  - Atypical lymphoid population, see comment.  - Benign salivary gland tissue.   COMMENT:   There is effacement of the salivary gland tissue by a monotonous population of small lymphoid cells with moderate clear cytoplasm. There are lymphoepithelial type lesions. Immunohistochemistry reveals the atypical lymphoid cells are positive for CD20 and bcl-2. There are residual disrupted germinal centers (CD23, bcl-6, CD10-positive; bcl-2 negative). CD3, CD43, and CD5 reveal admixed T-cells. CyclinD1 is negative. Ki-67 is overall low and elevated in residual germinal centers. CD138 reveals only scattered plasma cells which appear polytypic by light chain in situ hybridization. 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 While the morphology is suspicious, there is no aberrant phenotype or monotypic plasma cell population. Thus, PCR for B-cell gene rearrangement will be ordered to assess clonality (results to be reported in an addendum).  ADDENDUM:   PCR for B-cell gene rearrangement is POSITIVE.   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    04/06/2020 PET scan   1. Low level FDG uptake in the right parotid bed compatible with the history of recent surgery. 2. No evidence for hypermetabolic metastatic disease in the neck, chest, abdomen, or pelvis. 3. Diffuse hypermetabolism in the thyroid parenchyma, likely  inflammatory.   04/06/2020 Cancer Staging   Staging form: Hodgkin and Non-Hodgkin Lymphoma, AJCC 8th Edition - Clinical stage from 04/06/2020: Stage I (Marginal zone lymphoma) - Signed by Heath Lark, MD on 04/06/2020      PHYSICAL EXAMINATION: ECOG PERFORMANCE STATUS: 1 - Symptomatic but completely ambulatory  Vitals:   04/06/21 0837  BP: 122/85  Pulse: 77  Resp: 18  Temp: 98 F (36.7 C)  SpO2: 100%   Filed Weights   04/06/21 0837  Weight: 193 lb 9.6 oz (87.8 kg)    GENERAL:alert, no distress and comfortable SKIN: skin color, texture, turgor are normal, no rashes or significant lesions EYES: normal, Conjunctiva are pink and non-injected, sclera clear OROPHARYNX:no exudate, no erythema and lips, buccal mucosa, and tongue normal  NECK: Noted well-healed surgical scar with no other abnormalities LYMPH:  no palpable lymphadenopathy in the cervical, axillary or inguinal LUNGS: clear to auscultation and percussion with normal breathing effort HEART: regular rate & rhythm and no murmurs and no lower extremity edema ABDOMEN:abdomen soft, non-tender and normal bowel sounds Musculoskeletal:no cyanosis of digits and no clubbing  NEURO: alert & oriented x 3 with fluent speech, no focal motor/sensory deficits  LABORATORY DATA:  I have reviewed the data as listed    Component Value Date/Time   NA 142 04/06/2021 0815   NA 139 07/09/2016 1052   K 4.5 04/06/2021 0815   CL 108 04/06/2021 0815   CO2 25 04/06/2021 0815   GLUCOSE 104 (H) 04/06/2021 0815   BUN 11 04/06/2021 0815   BUN 7 07/09/2016 1052   CREATININE 0.86 04/06/2021 0815   CREATININE 0.95 04/20/2016 1632   CALCIUM 8.8 (L) 04/06/2021 0815   PROT 7.7 04/06/2021 0815  PROT 6.7 07/09/2016 1052   ALBUMIN 4.2 04/06/2021 0815   ALBUMIN 4.1 07/09/2016 1052   AST 16 04/06/2021 0815   ALT 14 04/06/2021 0815   ALKPHOS 94 04/06/2021 0815   BILITOT 0.4 04/06/2021 0815   BILITOT 0.4 07/09/2016 1052   GFRNONAA >60 04/06/2021  0815   GFRNONAA 74 04/20/2016 1632   GFRAA 121 07/09/2016 1052   GFRAA 85 04/20/2016 1632    No results found for: SPEP, UPEP  Lab Results  Component Value Date   WBC 7.4 04/06/2021   NEUTROABS 4.4 04/06/2021   HGB 14.8 04/06/2021   HCT 43.4 04/06/2021   MCV 93.5 04/06/2021   PLT 309 04/06/2021      Chemistry      Component Value Date/Time   NA 142 04/06/2021 0815   NA 139 07/09/2016 1052   K 4.5 04/06/2021 0815   CL 108 04/06/2021 0815   CO2 25 04/06/2021 0815   BUN 11 04/06/2021 0815   BUN 7 07/09/2016 1052   CREATININE 0.86 04/06/2021 0815   CREATININE 0.95 04/20/2016 1632      Component Value Date/Time   CALCIUM 8.8 (L) 04/06/2021 0815   ALKPHOS 94 04/06/2021 0815   AST 16 04/06/2021 0815   ALT 14 04/06/2021 0815   BILITOT 0.4 04/06/2021 0815   BILITOT 0.4 07/09/2016 1052

## 2021-04-06 NOTE — Assessment & Plan Note (Signed)
Clinically, she has no signs of recurrent disease There is no benefit for surveillance imaging I plan to see her once a year with history, physical examination and blood work only

## 2021-04-06 NOTE — Telephone Encounter (Signed)
-----   Message from Heath Lark, MD sent at 04/06/2021  9:32 AM EST ----- Regarding: labs Her TSH and rest of CMP is ok, pls call and let her know

## 2021-04-06 NOTE — Assessment & Plan Note (Signed)
TSH is within normal limits She will continue current prescribed Synthroid

## 2021-04-07 ENCOUNTER — Encounter: Payer: Self-pay | Admitting: Endocrinology

## 2021-04-07 ENCOUNTER — Ambulatory Visit (INDEPENDENT_AMBULATORY_CARE_PROVIDER_SITE_OTHER): Payer: Federal, State, Local not specified - PPO | Admitting: Endocrinology

## 2021-04-07 VITALS — BP 110/88 | HR 96 | Ht 64.0 in | Wt 193.8 lb

## 2021-04-07 DIAGNOSIS — E063 Autoimmune thyroiditis: Secondary | ICD-10-CM

## 2021-04-07 NOTE — Progress Notes (Signed)
Patient ID: Sydney Vaughn, female   DOB: 1975-07-08, 45 y.o.   MRN: [id number]             Reason for Appointment: Endocrinology follow-up    History of Present Illness:   WEIGHT management:  Previous history: She thinks her weight had been in the 170s a couple of years ago and has gained weight which she cannot shed Patient said that she has seen a dietitian in 2017 and is trying to modify her diet with taking salads for lunch, avoiding fried food usually She is trying to avoid snacking, trying to bake  foods as well as usually trying to avoid high-fat meats and avoiding sweets as well.  However she is usually trying to prepare food for her family with 4 children.   Her weight was about the same in July 2017  Since she had expressed difficulty with weight loss on her own with diet and exercise she was started on phentermine and Topamax on her initial visit in 07/2016   On her initial follow-up she had lost 18 pounds and had no side effects with the 2 drugs  In 2019 her Topamax was stopped because of her feeling that it was causing memory difficulties Also having periodic rapid heartbeat with phentermine low doses She was tried on Contrave and she had nausea and dizziness with the first tablet  RECENT history:  Previously in 2/21 she was having a rash with itching and Topamax was stopped by her PCP  No side effects with phentermine which she has been taking again since 9/22 She is generally able to control her portions with her diet Does not feel any palpitations and does have a smart watch to monitor her pulse She has had more stress lately  She is using exercise equipment at home and generally exercising regularly  Although previously had gained weight her weight is now leveled off   Wt Readings from Last 3 Encounters:  04/07/21 193 lb 12.8 oz (87.9 kg)  04/06/21 193 lb 9.6 oz (87.8 kg)  02/03/21 193 lb (87.5 kg)    Hypothyroidism was first diagnosed in 11/2015  At the  time of diagnosis patient was having symptoms of fatigue, weight gain and hair loss. She apparently has been having symptoms for several years the same way She has not had problems with cold intolerance except sometimes her feet get cold Baseline TSH was 7.7 and she was started on levothyroxine 25 g daily  On her initial consultation she was having symptoms of fatigue, having difficulty losing weight, fatigue on exercising and some hair loss although not as much, this is despite her taking the levothyroxine Also her thyroid functions including free T3 were normal  Recent history:  She has had some fatigue but also has had some insomnia and stress Currently taking 88 mcg levothyroxine, dose was reduced slightly when TSH was low normal in 9/22 She has been consistent with taking her thyroid supplement soon after waking up   Thyroid function results have been as follows:  Lab Results  Component Value Date   TSH 3.591 04/06/2021   TSH 0.35 02/02/2021   TSH 3.63 08/21/2019   FREET4 0.85 02/02/2021   FREET4 0.79 08/21/2019   FREET4 0.83 04/08/2019      Past Medical History:  Diagnosis Date   Adult acne    ANA positive    Anemia    iron infusions   Asthma    Chronic sinusitis    CT and MRI.  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   Fibromyalgia    Foot fracture, left 08/2012   History of appendicitis 09/2006   History of blood transfusion    last time 02/2018 hysterectomy surgery   Hypothyroidism    Intramural uterine fibroid 2019   Iron deficiency    Menorrhagia    Metatarsal fracture 10/2012   Right Proximal fifth metatarsal fracture   Obesity    Panic attack    at bedtime, awakes from sleep   Pelvic pain    resolved   Polyarthralgia    was seeing rheumatology   PONV (postoperative nausea and vomiting)     Past Surgical History:  Procedure Laterality Date   ABDOMINAL HYSTERECTOMY  04/2018   APPENDECTOMY     BUNIONECTOMY Left    DILATION AND  EVACUATION     PAROTIDECTOMY Right 03/04/2020   Procedure: right superficial PAROTIDECTOMY, with nerve preservation;  Surgeon: Melida Quitter, MD;  Location: [facility] OR;  Service: ENT;  Laterality: Right;   TUBAL LIGATION      Family History  Problem Relation Age of Onset   Thyroid disease Mother    Sinusitis Sister    Sinusitis Brother    Lupus Daughter        treated at [facility]   Allergies Daughter     Social History:  reports that she has never smoked. She has never used smokeless tobacco. She reports that she does not currently use alcohol. She reports that she does not use drugs.  Allergies:   Allergies  Allergen Reactions   Contrave [Naltrexone-Bupropion Hcl Er] Nausea Only    Nausea and dizziness   Oxycodone-Acetaminophen Er     Swollen face, eyes and caused itching     Allergies as of 04/07/2021       Reactions   Contrave [naltrexone-bupropion Hcl Er] Nausea Only   Nausea and dizziness   Oxycodone-acetaminophen Er    Swollen face, eyes and caused itching         Medication List        Accurate as of April 07, 2021 11:11 AM. If you have any questions, ask your nurse or doctor.          albuterol 108 (90 Base) MCG/ACT inhaler Commonly known as: VENTOLIN HFA Inhale 1-2 puffs into the lungs every 4 (four) hours as needed for wheezing or shortness of breath.   Biotin 1000 MCG tablet Take 2,000 mcg by mouth daily.   levothyroxine 88 MCG tablet Commonly known as: Synthroid Take 1 tablet (88 mcg total) by mouth daily before breakfast.   phentermine 15 MG capsule Take 1 capsule (15 mg total) by mouth every morning.   venlafaxine XR 75 MG 24 hr capsule Commonly known as: EFFEXOR-XR TAKE 1 CAPSULE (75 MG TOTAL) BY MOUTH DAILY WITH BREAKFAST.          Review of Systems      She had been taking venlafaxine for depression and anxiety long-term She stopped this 2 weeks ago on her own but is still having difficulty with anxiety and depression, has not  had any follow-up with PCP  No history of diabetes  Has borderline lipid levels  Lab Results  Component Value Date   HGBA1C 5.3 02/02/2021   HGBA1C 5.6 12/02/2015   HGBA1C 5.1 12/25/2012   Lab Results  Component Value Date   MICROALBUR 0.8 02/03/2016   LDLCALC 104 (H) 02/02/2021   CREATININE 0.86 04/06/2021  Examination:    BP 110/88 (BP Location: Left Arm, Patient Position: Sitting)   Pulse 96   Ht 5' 4"  (1.626 m)   Wt 193 lb 12.8 oz (87.9 kg)   LMP 04/14/2018   SpO2 98%   BMI 33.27 kg/m     Assessment:  OBESITY:  Her highest BMI has been about 36 at baseline  She is only on low-dose phentermine and weight has leveled off, previously was gaining weight She also has had more issues with stress, some depression and insomnia  She thinks she can do somewhat better with exercise and will continue work on her diet  Follow-up in 6 months   HYPOTHYROIDISM, mild with highest TSH 7.7  She has fatigue but this is likely unrelated Her TSH is back to normal with 88 mcg levothyroxine   PLAN:  As above She will continue 88 mcg levothyroxine every morning Given list of PCPs to establish with and will need to discuss options for treatment of anxiety and depression    Elayne Snare 04/07/2021, 11:11 AM     Note: This office note was prepared with Dragon voice recognition system technology. Any transcriptional errors that result from this process are unintentional.

## 2021-05-05 ENCOUNTER — Other Ambulatory Visit: Payer: Self-pay | Admitting: Endocrinology

## 2021-06-24 ENCOUNTER — Other Ambulatory Visit: Payer: Self-pay | Admitting: Endocrinology

## 2021-07-16 ENCOUNTER — Encounter: Payer: Self-pay | Admitting: Gastroenterology

## 2021-07-23 ENCOUNTER — Other Ambulatory Visit: Payer: Self-pay | Admitting: Endocrinology

## 2021-07-27 ENCOUNTER — Other Ambulatory Visit: Payer: Self-pay

## 2021-07-27 ENCOUNTER — Ambulatory Visit (AMBULATORY_SURGERY_CENTER): Payer: Federal, State, Local not specified - PPO

## 2021-07-27 VITALS — Ht 64.0 in | Wt 185.0 lb

## 2021-07-27 DIAGNOSIS — Z1211 Encounter for screening for malignant neoplasm of colon: Secondary | ICD-10-CM

## 2021-07-27 NOTE — Progress Notes (Signed)
?  Patient's pre-visit was done today over the phone with the patient  ? ?Name,DOB and address verified.  ?  ?Patient denies any allergies to Eggs and Soy.  ? ?Patient denies any problems with anesthesia/sedation. ? ?Patient denies taking diet pills or blood thinners.  ? ?Denies atrial flutter or atrial fib ? ?Denies chronic constipation ? ?No home Oxygen.  ? ?Packet of Prep instructions sent by My Chart or mail to patient including a copy of a consent form if by mail-pt is aware.  ? ?Patient understands to call us back with any questions or concerns.  ? ?Patient is aware of our care-partner policy and [id number] safety protocol.  ? ?Denies taking phentermine for the past yr. ? ?  ? ?  ?

## 2021-07-29 ENCOUNTER — Encounter: Payer: Self-pay | Admitting: Gastroenterology

## 2021-08-05 ENCOUNTER — Other Ambulatory Visit: Payer: Self-pay | Admitting: Endocrinology

## 2021-08-17 ENCOUNTER — Encounter: Payer: Self-pay | Admitting: Gastroenterology

## 2021-08-17 ENCOUNTER — Ambulatory Visit (AMBULATORY_SURGERY_CENTER): Admitting: Gastroenterology

## 2021-08-17 VITALS — BP 110/75 | HR 61 | Temp 98.2°F | Resp 20 | Ht 64.0 in | Wt 185.0 lb

## 2021-08-17 DIAGNOSIS — K635 Polyp of colon: Secondary | ICD-10-CM

## 2021-08-17 DIAGNOSIS — Z1211 Encounter for screening for malignant neoplasm of colon: Secondary | ICD-10-CM

## 2021-08-17 DIAGNOSIS — D125 Benign neoplasm of sigmoid colon: Secondary | ICD-10-CM

## 2021-08-17 MED ORDER — SODIUM CHLORIDE 0.9 % IV SOLN: 500.0000 mL | Freq: Once | INTRAVENOUS | Status: DC

## 2021-08-17 NOTE — Progress Notes (Signed)
Pt's states no medical or surgical changes since previsit or office visit. 

## 2021-08-17 NOTE — Progress Notes (Signed)
Pt non-responsive, VVS, Report to RN  ?

## 2021-08-17 NOTE — Patient Instructions (Signed)
Please read handouts provided. ?Continue present medications. ?Await pathology results. ?High Fiber Diet. ?Use FiberCon 1-2 tablets daily. ? ? ?YOU HAD AN ENDOSCOPIC PROCEDURE TODAY AT [facility]:   Refer to the procedure report that was given to you for any specific questions about what was found during the examination.  If the procedure report does not answer your questions, please call your gastroenterologist to clarify.  If you requested that your care partner not be given the details of your procedure findings, then the procedure report has been included in a sealed envelope for you to review at your convenience later. ? ?YOU SHOULD EXPECT: Some feelings of bloating in the abdomen. Passage of more gas than usual.  Walking can help get rid of the air that was put into your GI tract during the procedure and reduce the bloating. If you had a lower endoscopy (such as a colonoscopy or flexible sigmoidoscopy) you may notice spotting of blood in your stool or on the toilet paper. If you underwent a bowel prep for your procedure, you may not have a normal bowel movement for a few days. ? ?Please Note:  You might notice some irritation and congestion in your nose or some drainage.  This is from the oxygen used during your procedure.  There is no need for concern and it should clear up in a day or so. ? ?SYMPTOMS TO REPORT IMMEDIATELY: ? ?Following lower endoscopy (colonoscopy or flexible sigmoidoscopy): ? Excessive amounts of blood in the stool ? Significant tenderness or worsening of abdominal pains ? Swelling of the abdomen that is new, acute ? Fever of 100?F or higher ? ? ?For urgent or emergent issues, a gastroenterologist can be reached at any hour by calling [phone]. ?Do not use MyChart messaging for urgent concerns.  ? ? ?DIET:  We do recommend a small meal at first, but then you may proceed to your regular diet.  Drink plenty of fluids but you should avoid alcoholic beverages for 24  hours. ? ?ACTIVITY:  You should plan to take it easy for the rest of today and you should NOT DRIVE or use heavy machinery until tomorrow (because of the sedation medicines used during the test).   ? ?FOLLOW UP: ?Our staff will call the number listed on your records 48-72 hours following your procedure to check on you and address any questions or concerns that you may have regarding the information given to you following your procedure. If we do not reach you, we will leave a message.  We will attempt to reach you two times.  During this call, we will ask if you have developed any symptoms of COVID 19. If you develop any symptoms (ie: fever, flu-like symptoms, shortness of breath, cough etc.) before then, please call [phone].  If you test positive for Covid 19 in the 2 weeks post procedure, please call and report this information to [place].   ? ?If any biopsies were taken you will be contacted by phone or by letter within the next 1-3 weeks.  Please call us at [phone] if you have not heard about the biopsies in 3 weeks.  ? ? ?SIGNATURES/CONFIDENTIALITY: ?You and/or your care partner have signed paperwork which will be entered into your electronic medical record.  These signatures attest to the fact that that the information above on your After Visit Summary has been reviewed and is understood.  Full responsibility of the confidentiality of this discharge information lies with you and/or your  care-partner.  ?

## 2021-08-17 NOTE — Progress Notes (Signed)
Called to room to assist during endoscopic procedure.  Patient ID and intended procedure confirmed with present staff. Received instructions for my participation in the procedure from the performing physician.  

## 2021-08-17 NOTE — Op Note (Signed)
[place] ?Patient Name: Sydney Vaughn ?Procedure Date: 08/17/2021 1:46 PM ?MRN: [id number] ?Endoscopist: Justice Britain , MD ?Age: 46 ?Referring MD:  ?Date of Birth: 07/01/1975 ?Gender: Female ?Account #: [id number] ?Procedure:                Colonoscopy ?Indications:              Screening for colorectal malignant neoplasm, This  ?                          is the patient's first colonoscopy ?Medicines:                Monitored Anesthesia Care ?Procedure:                Pre-Anesthesia Assessment: ?                          - Prior to the procedure, a History and Physical  ?                          was performed, and patient medications and  ?                          allergies were reviewed. The patient's tolerance of  ?                          previous anesthesia was also reviewed. The risks  ?                          and benefits of the procedure and the sedation  ?                          options and risks were discussed with the patient.  ?                          All questions were answered, and informed consent  ?                          was obtained. Prior Anticoagulants: The patient has  ?                          taken no previous anticoagulant or antiplatelet  ?                          agents. ASA Grade Assessment: II - A patient with  ?                          mild systemic disease. After reviewing the risks  ?                          and benefits, the patient was deemed in  ?                          satisfactory condition to undergo the procedure. ?  After obtaining informed consent, the colonoscope  ?                          was passed under direct vision. Throughout the  ?                          procedure, the patient's blood pressure, pulse, and  ?                          oxygen saturations were monitored continuously. The  ?                          Olympus CF-HQ190L ([id number]) Colonoscope was  ?                          introduced through the anus  and advanced to the 5  ?                          cm into the ileum. The colonoscopy was performed  ?                          without difficulty. The patient tolerated the  ?                          procedure. The quality of the bowel preparation was  ?                          adequate. The terminal ileum, ileocecal valve,  ?                          appendiceal orifice, and rectum were photographed. ?Scope In: 2:00:49 PM ?Scope Out: 2:15:35 PM ?Scope Withdrawal Time: 0 hours 10 minutes 48 seconds  ?Total Procedure Duration: 0 hours 14 minutes 46 seconds  ?Findings:                 The digital rectal exam findings include  ?                          hemorrhoids. Pertinent negatives include no  ?                          palpable rectal lesions. ?                          The terminal ileum and ileocecal valve appeared  ?                          normal. ?                          A 6 mm polyp was found in the sigmoid colon. The  ?                          polyp was sessile. The polyp was removed with a  ?                            cold snare. Resection and retrieval were complete. ?                          Normal mucosa was found in the entire colon  ?                          otherwise. ?                          Non-bleeding non-thrombosed external and internal  ?                          hemorrhoids were found during retroflexion, during  ?                          perianal exam and during digital exam. The  ?                          hemorrhoids were Grade II (internal hemorrhoids  ?                          that prolapse but reduce spontaneously). ?Complications:            No immediate complications. ?Estimated Blood Loss:     Estimated blood loss was minimal. ?Impression:               - Hemorrhoids found on digital rectal exam. ?                          - The examined portion of the ileum was normal. ?                          - One 6 mm polyp in the sigmoid colon, removed with  ?                           a cold snare. Resected and retrieved. ?                          - Normal mucosa in the entire examined colon  ?                          otherwise. ?                          - Non-bleeding non-thrombosed external and internal  ?                          hemorrhoids. ?Recommendation:           - The patient will be observed post-procedure,  ?                          until all discharge criteria are met. ?                          - Discharge patient to home. ?                          -   Patient has a contact number available for  ?                          emergencies. The signs and symptoms of potential  ?                          delayed complications were discussed with the  ?                          patient. Return to normal activities tomorrow.  ?                          Written discharge instructions were provided to the  ?                          patient. ?                          - High fiber diet. ?                          - Use FiberCon 1-2 tablets PO daily. ?                          - Continue present medications. ?                          - Await pathology results. ?                          - Repeat colonoscopy in 5-10 years for surveillance  ?                          based on pathology results. ?                          - The findings and recommendations were discussed  ?                          with the patient. ?Gabriel Mansouraty, MD ?08/17/2021 2:23:13 PM ?

## 2021-08-17 NOTE — Progress Notes (Signed)
? ?GASTROENTEROLOGY PROCEDURE H&P NOTE  ? ?Primary Care Physician: ?Horald Pollen, MD ? ?HPI: ?Sydney Vaughn is a 46 y.o. female who presents for colonoscopy for screening. ? ?Past Medical History:  ?Diagnosis Date  ? Adult acne   ? ANA positive   ? Anemia   ? iron infusions  ? Asthma   ? Chronic sinusitis   ? CT and MRI.   ? DJD (degenerative joint disease)   ? 1st MTP joint  ? Fibromyalgia   ? Foot fracture, left 08/2012  ? History of appendicitis 09/2006  ? History of blood transfusion   ? last time 02/2018 hysterectomy surgery  ? Hypothyroidism   ? Intramural uterine fibroid 2019  ? Iron deficiency   ? Menorrhagia   ? Metatarsal fracture 10/2012  ? Right Proximal fifth metatarsal fracture  ? Obesity   ? Panic attack   ? at bedtime, awakes from sleep  ? Pelvic pain   ? resolved  ? Polyarthralgia   ? was seeing rheumatology  ? PONV (postoperative nausea and vomiting)   ? ?Past Surgical History:  ?Procedure Laterality Date  ? ABDOMINAL HYSTERECTOMY  04/2018  ? APPENDECTOMY    ? BUNIONECTOMY Left   ? DILATION AND EVACUATION    ? PAROTIDECTOMY Right 03/04/2020  ? Procedure: right superficial PAROTIDECTOMY, with nerve preservation;  Surgeon: Melida Quitter, MD;  Location: [facility];  Service: ENT;  Laterality: Right;  ? TUBAL LIGATION    ? ?Current Outpatient Medications  ?Medication Sig Dispense Refill  ? citalopram (CELEXA) 20 MG tablet Take 20 mg by mouth daily.    ? levothyroxine (SYNTHROID) 88 MCG tablet TAKE 1 TABLET BY MOUTH EVERY DAY BEFORE BREAKFAST 90 tablet 1  ? albuterol (PROVENTIL HFA;VENTOLIN HFA) 108 (90 Base) MCG/ACT inhaler Inhale 1-2 puffs into the lungs every 4 (four) hours as needed for wheezing or shortness of breath. 1 Inhaler 5  ? ?Current Facility-Administered Medications  ?Medication Dose Route Frequency Provider Last Rate Last Admin  ? 0.9 %  sodium chloride infusion  500 mL Intravenous Once Mansouraty, Telford Nab., MD      ? ? ?Current Outpatient Medications:  ?  citalopram (CELEXA) 20  MG tablet, Take 20 mg by mouth daily., Disp: , Rfl:  ?  levothyroxine (SYNTHROID) 88 MCG tablet, TAKE 1 TABLET BY MOUTH EVERY DAY BEFORE BREAKFAST, Disp: 90 tablet, Rfl: 1 ?  albuterol (PROVENTIL HFA;VENTOLIN HFA) 108 (90 Base) MCG/ACT inhaler, Inhale 1-2 puffs into the lungs every 4 (four) hours as needed for wheezing or shortness of breath., Disp: 1 Inhaler, Rfl: 5 ? ?Current Facility-Administered Medications:  ?  0.9 %  sodium chloride infusion, 500 mL, Intravenous, Once, Mansouraty, Telford Nab., MD ?Allergies  ?Allergen Reactions  ? Contrave [Naltrexone-Bupropion Hcl Er] Nausea Only  ?  Nausea and dizziness  ? Oxycodone-Acetaminophen Er   ?  Swollen face, eyes and caused itching   ? ?Family History  ?Problem Relation Age of Onset  ? Thyroid disease Mother   ? Sinusitis Sister   ? Sinusitis Brother   ? Lupus Daughter   ?     treated at [facility] Rheum  ? Allergies Daughter   ? Colon cancer Neg Hx   ? Colon polyps Neg Hx   ? Esophageal cancer Neg Hx   ? Rectal cancer Neg Hx   ? Stomach cancer Neg Hx   ? ?Social History  ? ?Socioeconomic History  ? Marital status: Married  ?  Spouse name: Not on file  ?  Number of children: 4  ? Years of education: Not on file  ? Highest education level: Not on file  ?Occupational History  ?  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 Smoking status: Never  ? Smokeless tobacco: Never  ?Vaping Use  ? Vaping Use: Never used  ?Substance and Sexual Activity  ? Alcohol use: Not Currently  ?  Comment: occ   ? Drug use: No  ? Sexual activity: Yes  ?  Partners: Male  ?  Birth control/protection: Surgical  ?  Comment: 1st iHysterectomy  ?Other Topics Concern  ? Not on file  ?Social History Narrative  ? Ms. Shinault lives in [place] with her husband and children. She has 4 daughters: ages ranging from 70 yo- 45 yo. 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    ? ?Social Determinants of Health  ? ?Financial Resource Strain: Not on file   ?Food Insecurity: Not on file  ?Transportation Needs: Not on file  ?Physical Activity: Not on file  ?Stress: Not on file  ?Social Connections: Not on file  ?Intimate Partner Violence: Not on file  ? ? ?Physical Exam: ?Today's Vitals  ? 08/17/21 1314  ?BP: 136/82  ?Pulse: 78  ?Temp: 98.2 ?F (36.8 ?C)  ?TempSrc: Skin  ?SpO2: 98%  ?Weight: 185 lb (83.9 kg)  ?Height: 5' 4"  (1.626 m)  ? ?Body mass index is 31.76 kg/m?. ?GEN: NAD ?EYE: Sclerae anicteric ?ENT: MMM ?CV: Non-tachycardic ?GI: Soft, NT/ND ?NEURO:  Alert & Oriented x 3 ? ?Lab Results: ?No results for input(s): WBC, HGB, HCT, PLT in the last 72 hours. ?BMET ?No results for input(s): NA, K, CL, CO2, GLUCOSE, BUN, CREATININE, CALCIUM in the last 72 hours. ?LFT ?No results for input(s): PROT, ALBUMIN, AST, ALT, ALKPHOS, BILITOT, BILIDIR, IBILI in the last 72 hours. ?PT/INR ?No results for input(s): LABPROT, INR in the last 72 hours. ? ? ?Impression / Plan: ?This is a 46 y.o.female who presents for colonoscopy for screening. ? ?The risks and benefits of endoscopic evaluation/treatment were discussed with the patient and/or family; these include but are not limited to the risk of perforation, infection, bleeding, missed lesions, lack of diagnosis, severe illness requiring hospitalization, as well as anesthesia and sedation related illnesses.  The patient's history has been reviewed, patient examined, no change in status, and deemed stable for procedure.  The patient and/or family is agreeable to proceed.  ? ? ?Justice Britain, MD ?[facility] ?Advanced Endoscopy ?Office # [phone] ? ?

## 2021-08-19 ENCOUNTER — Telehealth: Payer: Self-pay

## 2021-08-19 NOTE — Telephone Encounter (Signed)
Follow up call placed, VM obtained and message left. ?SChaplin, RN,BSN ? ?

## 2021-08-19 NOTE — Telephone Encounter (Signed)
Left message on follow up call. 

## 2021-08-21 ENCOUNTER — Encounter: Payer: Self-pay | Admitting: Gastroenterology

## 2021-08-24 ENCOUNTER — Encounter: Payer: Self-pay | Admitting: Gastroenterology

## 2021-08-24 ENCOUNTER — Encounter: Payer: Self-pay | Admitting: Podiatry

## 2021-08-24 NOTE — Telephone Encounter (Signed)
Scheduled 09/14/21 115p

## 2021-08-24 NOTE — Telephone Encounter (Signed)
Please contact patient to schedule

## 2021-09-14 ENCOUNTER — Ambulatory Visit: Admitting: Podiatry

## 2021-10-05 ENCOUNTER — Other Ambulatory Visit: Payer: Federal, State, Local not specified - PPO

## 2021-10-07 ENCOUNTER — Ambulatory Visit: Payer: Federal, State, Local not specified - PPO | Admitting: Endocrinology

## 2021-12-13 ENCOUNTER — Other Ambulatory Visit (INDEPENDENT_AMBULATORY_CARE_PROVIDER_SITE_OTHER)

## 2021-12-13 DIAGNOSIS — E063 Autoimmune thyroiditis: Secondary | ICD-10-CM

## 2021-12-13 LAB — T4, FREE: Free T4: 0.76 ng/dL (ref 0.60–1.60)

## 2021-12-13 LAB — TSH: TSH: 1.19 u[IU]/mL (ref 0.35–5.50)

## 2021-12-16 ENCOUNTER — Encounter: Payer: Self-pay | Admitting: Endocrinology

## 2021-12-16 ENCOUNTER — Ambulatory Visit: Payer: Federal, State, Local not specified - PPO | Admitting: Endocrinology

## 2021-12-16 VITALS — BP 112/76 | HR 85 | Ht 64.0 in | Wt 191.6 lb

## 2021-12-16 DIAGNOSIS — Z6832 Body mass index (BMI) 32.0-32.9, adult: Secondary | ICD-10-CM | POA: Diagnosis not present

## 2021-12-16 DIAGNOSIS — E669 Obesity, unspecified: Secondary | ICD-10-CM

## 2021-12-16 DIAGNOSIS — E063 Autoimmune thyroiditis: Secondary | ICD-10-CM | POA: Diagnosis not present

## 2021-12-16 MED ORDER — SEMAGLUTIDE-WEIGHT MANAGEMENT 0.25 MG/0.5ML ~~LOC~~ SOAJ: 0.2500 mg | SUBCUTANEOUS | 5 refills | Status: DC

## 2021-12-16 NOTE — Progress Notes (Signed)
Patient ID: Sydney Vaughn, female   DOB: [birth year], 46 y.o.   MRN: [id number]             Reason for Appointment: Endocrinology follow-up    History of Present Illness:   WEIGHT management:  Previous history: She thinks her weight had been in the 170s a couple of years ago and has gained weight which she cannot shed Patient said that she has seen a dietitian in 2017 and is trying to modify her diet with taking salads for lunch, avoiding fried food usually She is trying to avoid snacking, trying to bake  foods as well as usually trying to avoid high-fat meats and avoiding sweets as well.  However she is usually trying to prepare food for her family with 4 children.   Her weight was about the same in July 2017  Since she had expressed difficulty with weight loss on her own with diet and exercise she was started on phentermine and Topamax on her initial visit in 07/2016   On her initial follow-up she had lost 18 pounds and had no side effects with the 2 drugs  In 2019 her Topamax was stopped because of her feeling that it was causing memory difficulties Also having periodic rapid heartbeat with phentermine low doses She was tried on Contrave and she had nausea and dizziness with the first tablet  RECENT history:  Previously in 2/21 she was having a rash with itching and Topamax was stopped by her PCP  Previously had no side effects with phentermine which she had been taking again since 9/22 However she says that after her last visit she felt jittery with taking phentermine and stopped it She tries to control her portions with her diet Does not feel any palpitations and does have a smart watch to monitor her pulse  Her weight is about the same as last year although she had lost a little weight earlier this year She has not exercised as much lately but is planning to join a gym She is asking for a weight loss medication like Ozempic She thinks she had significant lethargy with  Contrave  Wt Readings from Last 3 Encounters:  12/16/21 191 lb 9.6 oz (86.9 kg)  08/17/21 185 lb (83.9 kg)  07/27/21 185 lb (83.9 kg)    Hypothyroidism was first diagnosed in 11/2015  At the time of diagnosis patient was having symptoms of fatigue, weight gain and hair loss. She apparently has been having symptoms for several years the same way She has not had problems with cold intolerance except sometimes her feet get cold Baseline TSH was 7.7 and she was started on levothyroxine 25 g daily  On her initial consultation she was having symptoms of fatigue, having difficulty losing weight, fatigue on exercising and some hair loss although not as much, this is despite her taking the levothyroxine Also her thyroid functions including free T3 were normal  Recent history:  She has had less fatigue with relief of her stress situation  Currently taking 88 mcg levothyroxine, dose was reduced slightly when TSH was low normal in 9/22 She has been regular with taking her thyroid supplement soon after waking up   Thyroid function results have been as follows:  Lab Results  Component Value Date   TSH 1.19 12/13/2021   TSH 3.591 04/06/2021   TSH 0.35 02/02/2021   FREET4 0.76 12/13/2021   FREET4 0.85 02/02/2021   FREET4 0.79 08/21/2019      Past Medical History:  Diagnosis Date   Adult acne    ANA positive    Anemia    iron infusions   Asthma    Chronic sinusitis    CT and MRI.    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   Fibromyalgia    Foot fracture, left 08/2012   History of appendicitis 09/2006   History of blood transfusion    last time 02/2018 hysterectomy surgery   Hypothyroidism    Intramural uterine fibroid 2019   Iron deficiency    Menorrhagia    Metatarsal fracture 10/2012   Right Proximal fifth metatarsal fracture   Obesity    Panic attack    at bedtime, awakes from sleep   Pelvic pain    resolved   Polyarthralgia    was seeing rheumatology    PONV (postoperative nausea and vomiting)     Past Surgical History:  Procedure Laterality Date   ABDOMINAL HYSTERECTOMY  04/2018   APPENDECTOMY     BUNIONECTOMY Left    DILATION AND EVACUATION     PAROTIDECTOMY Right 03/04/2020   Procedure: right superficial PAROTIDECTOMY, with nerve preservation;  Surgeon: Melida Quitter, MD;  Location: [facility];  Service: ENT;  Laterality: Right;   TUBAL LIGATION      Family History  Problem Relation Age of Onset   Thyroid disease Mother    Sinusitis Sister    Sinusitis Brother    Lupus Daughter        treated at [facility] Rheum   Allergies Daughter    Colon cancer Neg Hx    Colon polyps Neg Hx    Esophageal cancer Neg Hx    Rectal cancer Neg Hx    Stomach cancer Neg Hx     Social History:  reports that she has never smoked. She has never used smokeless tobacco. She reports that she does not currently use alcohol. She reports that she does not use drugs.  Allergies:   Allergies  Allergen Reactions   Contrave [Naltrexone-Bupropion Hcl Er] Nausea Only    Nausea and dizziness   Oxycodone-Acetaminophen Er     Swollen face, eyes and caused itching     Allergies as of 12/16/2021       Reactions   Contrave [naltrexone-bupropion Hcl Er] Nausea Only   Nausea and dizziness   Oxycodone-acetaminophen Er    Swollen face, eyes and caused itching         Medication List        Accurate as of December 16, 2021  2:01 PM. If you have any questions, ask your nurse or doctor.          albuterol 108 (90 Base) MCG/ACT inhaler Commonly known as: VENTOLIN HFA Inhale 1-2 puffs into the lungs every 4 (four) hours as needed for wheezing or shortness of breath.   citalopram 20 MG tablet Commonly known as: CELEXA Take 20 mg by mouth daily.   levothyroxine 88 MCG tablet Commonly known as: SYNTHROID TAKE 1 TABLET BY MOUTH EVERY DAY BEFORE BREAKFAST   meloxicam 15 MG tablet Commonly known as: MOBIC Take 15 mg by mouth daily.          Review of  Systems      She had been taking venlafaxine for depression and anxiety long-term Also tried on Celexa but she has stopped this   No history of diabetes  Has borderline LDL levels  Lab Results  Component Value Date   HGBA1C 5.3 02/02/2021   HGBA1C  5.6 12/02/2015   HGBA1C 5.1 12/25/2012   Lab Results  Component Value Date   MICROALBUR 0.8 02/03/2016   LDLCALC 104 (H) 02/02/2021   CREATININE 0.86 04/06/2021          Examination:    BP 112/76   Pulse 85   Ht '5\' 4"'$  (1.626 m)   Wt 191 lb 9.6 oz (86.9 kg)   LMP 04/14/2018   SpO2 99%   BMI 32.89 kg/m     Assessment:  OBESITY:  Her highest BMI has been about 36 at baseline  She is only on low-dose phentermine and weight has leveled off, previously was gaining weight She also has had more issues with stress, some depression and insomnia  She thinks she can do somewhat better with exercise and will continue work on her diet  Follow-up in 6 months   HYPOTHYROIDISM, mild with highest TSH 7.7  She has fatigue but this is likely unrelated Her TSH is back to normal with 88 mcg levothyroxine   PLAN:  As above She will continue 88 mcg levothyroxine every morning   For her weight loss program she will look at the cost of Wegovy and 0.5 mg prescription sent Showed her the injection device and how this would be adjusted, discussed possible side effects of nausea She thinks that she can do the injection herself To start with 0.25 mg for 4 weeks and then 0.5 and continue to titrate every month, maximum dose 2 mg Patient brochure given and she will check on the coverage and also get a co-pay card online  Follow-up in 4 months  Elmina Hendel Dwyane Dee 12/16/2021, 2:01 PM     Note: This office note was prepared with Estate agent. Any transcriptional errors that result from this process are unintentional.

## 2021-12-16 NOTE — Patient Instructions (Addendum)
Check coverage for [facility], check on copay card

## 2022-01-07 IMAGING — CT NM PET TUM IMG INITIAL (PI) SKULL BASE T - THIGH
1 of 7 series · 1 of 25 positions shown · non-contrast
Comparison: None.

CLINICAL DATA: Initial treatment strategy for marginal zone
lymphoma of the right parotid gland.

EXAM:
NUCLEAR MEDICINE PET SKULL BASE TO THIGH
TECHNIQUE: 9.5 mCi F-18 FDG was injected intravenously. Full-ring PET imaging
was performed from the skull base to thigh after the radiotracer. CT
data was obtained and used for attenuation correction and anatomic
localization.
Fasting blood glucose: 94 mg/dl

[Series 5: pet wb uncorrected (nac) · axial · 5.0mm · 4.07mm/px · 1 of 290 slices shown]
[im 193/290]
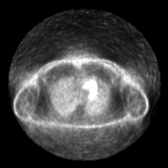

[1 of 25 positions shown; findings below may reference images not displayed]

FINDINGS: Mediastinal blood pool activity: SUV max

Liver activity: SUV max

NECK: Low level uptake in the right parotid bed is consistent with
the recent surgery. No evidence for hypermetabolic lymphadenopathy.
Diffuse hypermetabolism of the thyroid parenchyma evident without a
discrete mass lesion evident by CT imaging.

Incidental CT findings: none

CHEST: No hypermetabolic mediastinal or hilar nodes. No suspicious
pulmonary nodules on the CT scan.

Incidental CT findings: No suspicious nodule or mass.

ABDOMEN/PELVIS: No abnormal hypermetabolic activity within the
liver, pancreas, adrenal glands, or spleen. No hypermetabolic lymph
nodes in the abdomen or pelvis.

Incidental CT findings: none

SKELETON: No focal hypermetabolic activity to suggest skeletal
metastasis.

Incidental CT findings: None.
IMPRESSION: 1. Low level FDG uptake in the right parotid bed compatible with the
history of recent surgery.
2. No evidence for hypermetabolic metastatic disease in the neck,
chest, abdomen, or pelvis.
3. Diffuse hypermetabolism in the thyroid parenchyma, likely
inflammatory.

## 2022-02-02 ENCOUNTER — Other Ambulatory Visit: Payer: Self-pay | Admitting: Otolaryngology

## 2022-02-02 DIAGNOSIS — C8591 Non-Hodgkin lymphoma, unspecified, lymph nodes of head, face, and neck: Secondary | ICD-10-CM

## 2022-02-15 ENCOUNTER — Ambulatory Visit
Admission: RE | Admit: 2022-02-15 | Discharge: 2022-02-15 | Disposition: A | Discharge status: Home / Self Care | Source: Ambulatory Visit | Attending: Otolaryngology | Admitting: Otolaryngology

## 2022-02-15 DIAGNOSIS — C8591 Non-Hodgkin lymphoma, unspecified, lymph nodes of head, face, and neck: Secondary | ICD-10-CM

## 2022-02-15 MED ORDER — IOPAMIDOL (ISOVUE-300) INJECTION 61%
75.0000 mL | Freq: Once | INTRAVENOUS | Status: AC | PRN
  Administered 2022-02-15: 75 mL via INTRAVENOUS

## 2022-02-18 ENCOUNTER — Telehealth: Payer: Self-pay

## 2022-02-18 NOTE — Telephone Encounter (Signed)
She called and left a message asking if you could review her CT ordered by ENT on 10/3. She is concerned her lymphoma is back. She is requesting appt.

## 2022-02-18 NOTE — Telephone Encounter (Signed)
Called back and scheduled appt with Dr. Alvy Bimler on 10/10 at 8 am, instructed to arrive 15 mins early. She verbalized understanding.

## 2022-02-18 NOTE — Telephone Encounter (Signed)
Pls call her back

## 2022-02-22 ENCOUNTER — Inpatient Hospital Stay: Attending: Hematology and Oncology | Admitting: Hematology and Oncology

## 2022-02-22 ENCOUNTER — Encounter: Payer: Self-pay | Admitting: Hematology and Oncology

## 2022-02-22 VITALS — BP 123/82 | HR 84 | Temp 97.8°F | Resp 18 | Ht 64.0 in | Wt 194.6 lb

## 2022-02-22 DIAGNOSIS — G629 Polyneuropathy, unspecified: Secondary | ICD-10-CM | POA: Diagnosis not present

## 2022-02-22 DIAGNOSIS — C8581 Other specified types of non-Hodgkin lymphoma, lymph nodes of head, face, and neck: Secondary | ICD-10-CM

## 2022-02-22 DIAGNOSIS — C884 Extranodal marginal zone B-cell lymphoma of mucosa-associated lymphoid tissue [MALT-lymphoma]: Secondary | ICD-10-CM | POA: Diagnosis not present

## 2022-02-22 NOTE — Progress Notes (Signed)
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 [facility]) Examination revealed thickening near the parotid region consistent with prior surgery I have reviewed multiple imaging studies with the patient I explained to her that CT imaging is not considered diagnostic of active disease I recommend PET/CT imaging for staging If PET/CT imaging is positive, I would recommend treatment with immunotherapy I explained the rationale of ordering PET/CT imaging as well as the rationale behind treatment I gave her information to call to get the PET/CT schedule I plan to see her back next week for further follow-up  Orders Placed This Encounter  Procedures   NM PET Image Restage (PS) Skull Base to Thigh (F-18 FDG)    Standing Status:   Future    Standing Expiration Date:   02/23/2023    Order Specific Question:   If indicated for the ordered procedure, I authorize the administration of a radiopharmaceutical per Radiology protocol    Answer:   Yes    Order Specific Question:   Preferred imaging location?    Answer:   [facility]    Order Specific Question:   Radiology Contrast Protocol - do NOT remove file path    Answer:   [email or web address]    Order Specific Question:   Is the patient pregnant?    Answer:   No    All questions were answered. The patient knows to call the clinic with any problems, questions or concerns. The total time spent in the appointment was 30 minutes encounter with patients including review of chart and various tests results, discussions about plan of care and coordination of care plan   Heath Lark, MD 02/22/2022 8:26 AM  INTERVAL HISTORY: Please see below  for problem oriented charting. she returns for urgent evaluation She noted palpable nodule at the surgical bed on the right parotid site She was seen by ear nose and throat surgeon recently who ordered CT imaging She is concerned over cancer recurrence and is being seen today She denies any pain, discomfort or limitation of neck movement on that side No other lymphadenopathy  REVIEW OF SYSTEMS:   Constitutional: Denies fevers, chills or abnormal weight loss Eyes: Denies blurriness of vision Ears, nose, mouth, throat, and face: Denies mucositis or sore throat Respiratory: Denies cough, dyspnea or wheezes Cardiovascular: Denies palpitation, chest discomfort or lower extremity swelling Gastrointestinal:  Denies nausea, heartburn or change in bowel habits Skin: Denies abnormal skin rashes Lymphatics: Denies new lymphadenopathy or easy bruising Neurological:Denies numbness, tingling or new weaknesses Behavioral/Psych: Mood is stable, no new changes  All other systems were reviewed with the patient and are negative.  I have reviewed the past medical history, past surgical history, social history and family history with the patient and they are unchanged from previous note.  ALLERGIES:  is allergic to contrave [naltrexone-bupropion hcl er] and oxycodone-acetaminophen er.  MEDICATIONS:  Current Outpatient Medications  Medication Sig Dispense Refill   albuterol (PROVENTIL HFA;VENTOLIN HFA) 108 (90 Base) MCG/ACT inhaler Inhale 1-2 puffs into the lungs every 4 (four) hours as needed for wheezing or shortness of breath. 1 Inhaler 5   levothyroxine (SYNTHROID) 88 MCG tablet TAKE 1 TABLET BY MOUTH EVERY DAY BEFORE BREAKFAST 90 tablet 1   meloxicam (MOBIC) 15 MG tablet Take  15 mg by mouth daily.     No current facility-administered medications for this visit.    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 Lymphoma, sarcoid, or IgG 4 related disease are considered given the infiltrative appearance. If a primary parotid neoplasm the extent of the mass would be concerning for aggressive disease. The lesion extends to the subcutaneous preauricular space and should be readily amenable to biopsy.   03/04/2020 Surgery   Procedure: Right superficial parotidectomy with dissection of facial nerve Surgeon: Redmond Baseman Anesth: General and local with 1% lidocaine with 1:100,000 epinephrine Findings: Right superior parotid with superficial mass dissected easily from nerve branches.   03/04/2020 Pathology Results   FINAL MICROSCOPIC DIAGNOSIS:   A. PAROTID, RIGHT, PAROTIDECTOMY:  - Atypical lymphoid population, see comment.  - Benign salivary gland tissue.   COMMENT:   There is effacement of the salivary gland tissue by a monotonous population of small lymphoid cells with moderate clear cytoplasm. There are lymphoepithelial type lesions. Immunohistochemistry reveals the atypical lymphoid cells are positive for CD20 and bcl-2. There are residual disrupted germinal centers (CD23, bcl-6, CD10-positive; bcl-2 negative). CD3, CD43, and CD5 reveal admixed T-cells. CyclinD1 is negative. Ki-67 is overall low and elevated in residual germinal centers. CD138 reveals only scattered plasma cells which appear polytypic by light chain in situ hybridization. 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 While the morphology is suspicious, there is no aberrant phenotype or monotypic plasma cell population. Thus, PCR for B-cell gene rearrangement will be ordered to assess clonality (results to be reported in an addendum).  ADDENDUM:   PCR for B-cell gene rearrangement is POSITIVE.   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    04/06/2020 PET scan   1. Low level FDG uptake in the right parotid bed compatible with the history of recent surgery. 2. No evidence for hypermetabolic metastatic disease in the neck, chest, abdomen, or pelvis. 3. Diffuse hypermetabolism in the thyroid parenchyma, likely inflammatory.   04/06/2020 Cancer Staging   Staging form: Hodgkin and Non-Hodgkin Lymphoma, AJCC 8th Edition - Clinical stage from 04/06/2020: Stage I (Marginal zone lymphoma) - Signed by Heath Lark, MD on 04/06/2020   02/17/2022 Imaging   1. Irregular infiltrative appearing area of soft tissue thickening and contrast enhancement involving the right parotid gland and adjacent preauricular subcutaneous and skin. This extends inferiorly approximately to the level of the mandibular angle and superiorly beyond the field of view. Findings are more pronounced than expected for postsurgical changes. Findings are concerning for recurrent lymphoma. No lymphadenopathy identified. 2. Enlarged uniformly enhancing thyroid gland, similar to prior CT     PHYSICAL EXAMINATION: ECOG PERFORMANCE STATUS: 1 - Symptomatic but completely ambulatory  Vitals:   02/22/22 0805  BP: 123/82  Pulse: 84  Resp: 18  Temp: 97.8 F (36.6 C)  SpO2: 100%   Filed Weights   02/22/22 0805  Weight: 194 lb 9.6 oz (88.3 kg)    GENERAL:alert, no distress and comfortable SKIN: skin color, texture, turgor are normal, no rashes or significant lesions EYES: normal, Conjunctiva are pink and non-injected, sclera clear OROPHARYNX:no exudate, no erythema and lips, buccal mucosa, and tongue normal  NECK:  supple, thyroid normal size, non-tender, without nodularity.  Noted soft tissue thickening at the site of prior surgery LYMPH:  no palpable lymphadenopathy in the cervical, axillary or inguinal LUNGS: clear to auscultation  and percussion with normal breathing effort HEART: regular rate & rhythm and no murmurs and no lower extremity edema ABDOMEN:abdomen soft, non-tender and normal bowel sounds Musculoskeletal:no cyanosis of digits and no clubbing  NEURO: alert & oriented x 3 with fluent speech, no focal motor/sensory deficits  LABORATORY DATA:  I have reviewed the data as listed    Component Value Date/Time   NA 142 04/06/2021 0815   NA 139 07/09/2016 1052   K 4.5 04/06/2021 0815   CL 108 04/06/2021 0815   CO2 25 04/06/2021 0815   GLUCOSE 104 (H) 04/06/2021 0815   BUN 11 04/06/2021 0815   BUN 7 07/09/2016 1052   CREATININE 0.86 04/06/2021 0815   CREATININE 0.95 04/20/2016 1632   CALCIUM 8.8 (L) 04/06/2021 0815   PROT 7.7 04/06/2021 0815   PROT 6.7 07/09/2016 1052   ALBUMIN 4.2 04/06/2021 0815   ALBUMIN 4.1 07/09/2016 1052   AST 16 04/06/2021 0815   ALT 14 04/06/2021 0815   ALKPHOS 94 04/06/2021 0815   BILITOT 0.4 04/06/2021 0815   BILITOT 0.4 07/09/2016 1052   GFRNONAA >60 04/06/2021 0815   GFRNONAA 74 04/20/2016 1632   GFRAA 121 07/09/2016 1052   GFRAA 85 04/20/2016 1632    No results found for: "SPEP", "UPEP"  Lab Results  Component Value Date   WBC 7.4 04/06/2021   NEUTROABS 4.4 04/06/2021   HGB 14.8 04/06/2021   HCT 43.4 04/06/2021   MCV 93.5 04/06/2021   PLT 309 04/06/2021      Chemistry      Component Value Date/Time   NA 142 04/06/2021 0815   NA 139 07/09/2016 1052   K 4.5 04/06/2021 0815   CL 108 04/06/2021 0815   CO2 25 04/06/2021 0815   BUN 11 04/06/2021 0815   BUN 7 07/09/2016 1052   CREATININE 0.86 04/06/2021 0815   CREATININE 0.95 04/20/2016 1632      Component Value Date/Time   CALCIUM 8.8 (L) 04/06/2021 0815   ALKPHOS 94 04/06/2021 0815   AST 16 04/06/2021 0815   ALT 14 04/06/2021 0815   BILITOT 0.4 04/06/2021 0815   BILITOT 0.4 07/09/2016 1052       RADIOGRAPHIC STUDIES: I have reviewed multiple imaging studies with the patient I have personally  reviewed the radiological images as listed and agreed with the findings in the report. CT SOFT TISSUE NECK W CONTRAST  Result Date: 02/17/2022 CLINICAL DATA:  Non-Hodgkin lymphoma of lymph nodes of head, unspecified non-Hodgkin lymphoma type (HCC) C85.91 (ICD-10-CM). EXAM: CT NECK WITH CONTRAST TECHNIQUE: Multidetector CT imaging of the neck was performed using the standard protocol following the bolus administration of intravenous contrast. RADIATION DOSE REDUCTION: This exam was performed according to the departmental dose-optimization program which includes automated exposure control, adjustment of the mA and/or kV according to patient size and/or use of iterative reconstruction technique. CONTRAST:  73m ISOVUE-300 IOPAMIDOL (ISOVUE-300) INJECTION 61% COMPARISON:  None Available. FINDINGS: Pharynx and larynx: Normal. No mass or swelling. Salivary glands: Irregular infiltrative appearing area of soft tissue thickening and contrast enhancement involving the right parotid gland and adjacent preauricular subcutaneous and skin. This extends inferiorly approximately to the level of the mandibular angle and superiorly beyond the field of view. Findings are more pronounced than expected for postsurgical changes. The left parotid gland,  bilateral sublingual and submandibular glands are unremarkable. Thyroid: Enlarged uniformly enhancing thyroid gland, similar to prior CT. Lymph nodes: No lymphadenopathy identified. Vascular: Negative. Limited intracranial: Negative. Visualized orbits: Minimally included in the field of view. No abnormality identified. Mastoids and visualized paranasal sinuses: Clear. Skeleton: No acute or aggressive process. Degenerative changes of the cervical spine. Upper chest: Negative. Other: None. IMPRESSION: 1. Irregular infiltrative appearing area of soft tissue thickening and contrast enhancement involving the right parotid gland and adjacent preauricular subcutaneous and skin. This extends  inferiorly approximately to the level of the mandibular angle and superiorly beyond the field of view. Findings are more pronounced than expected for postsurgical changes. Findings are concerning for recurrent lymphoma. No lymphadenopathy identified. 2. Enlarged uniformly enhancing thyroid gland, similar to prior CT. Electronically Signed   By: Pedro Earls M.D.   On: 02/17/2022 12:48

## 2022-02-22 NOTE — Assessment & Plan Note (Signed)
Examination revealed thickening near the parotid region consistent with prior surgery I have reviewed multiple imaging studies with the patient I explained to her that CT imaging is not considered diagnostic of active disease I recommend PET/CT imaging for staging If PET/CT imaging is positive, I would recommend treatment with immunotherapy I explained the rationale of ordering PET/CT imaging as well as the rationale behind treatment I gave her information to call to get the PET/CT schedule I plan to see her back next week for further follow-up

## 2022-03-02 ENCOUNTER — Encounter (HOSPITAL_COMMUNITY)
Admission: RE | Admit: 2022-03-02 | Discharge: 2022-03-02 | Disposition: A | Discharge status: Home / Self Care | Source: Ambulatory Visit | Attending: Hematology and Oncology | Admitting: Hematology and Oncology

## 2022-03-02 ENCOUNTER — Telehealth: Payer: Self-pay

## 2022-03-02 DIAGNOSIS — C8581 Other specified types of non-Hodgkin lymphoma, lymph nodes of head, face, and neck: Secondary | ICD-10-CM | POA: Insufficient documentation

## 2022-03-02 LAB — GLUCOSE, CAPILLARY: Glucose-Capillary: 90 mg/dL (ref 70–99)

## 2022-03-02 MED ORDER — FLUDEOXYGLUCOSE F - 18 (FDG) INJECTION
10.0000 | Freq: Once | INTRAVENOUS | Status: AC | PRN
  Administered 2022-03-02: 10.3 via INTRAVENOUS

## 2022-03-02 NOTE — Telephone Encounter (Signed)
Called and given earlier time for for 10/19 appt at 3 pm, arrive 20 mins early. She is aware.

## 2022-03-03 ENCOUNTER — Telehealth: Payer: Self-pay

## 2022-03-03 ENCOUNTER — Encounter: Payer: Self-pay | Admitting: Hematology and Oncology

## 2022-03-03 ENCOUNTER — Inpatient Hospital Stay

## 2022-03-03 ENCOUNTER — Other Ambulatory Visit: Payer: Self-pay

## 2022-03-03 ENCOUNTER — Inpatient Hospital Stay: Admitting: Hematology and Oncology

## 2022-03-03 ENCOUNTER — Inpatient Hospital Stay (HOSPITAL_BASED_OUTPATIENT_CLINIC_OR_DEPARTMENT_OTHER): Admitting: Hematology and Oncology

## 2022-03-03 VITALS — BP 129/84 | HR 89 | Temp 98.6°F | Resp 18 | Ht 64.0 in | Wt 193.2 lb

## 2022-03-03 DIAGNOSIS — C884 Extranodal marginal zone B-cell lymphoma of mucosa-associated lymphoid tissue [MALT-lymphoma]: Secondary | ICD-10-CM | POA: Diagnosis not present

## 2022-03-03 DIAGNOSIS — C8581 Other specified types of non-Hodgkin lymphoma, lymph nodes of head, face, and neck: Secondary | ICD-10-CM

## 2022-03-03 DIAGNOSIS — M792 Neuralgia and neuritis, unspecified: Secondary | ICD-10-CM | POA: Diagnosis not present

## 2022-03-03 LAB — CBC WITH DIFFERENTIAL (CANCER CENTER ONLY)
Abs Immature Granulocytes: 0.01 10*3/uL (ref 0.00–0.07)
Basophils Absolute: 0.1 10*3/uL (ref 0.0–0.1)
Basophils Relative: 1 %
Eosinophils Absolute: 0.7 10*3/uL — ABNORMAL HIGH (ref 0.0–0.5)
Eosinophils Relative: 9 %
HCT: 39.9 % (ref 36.0–46.0)
Hemoglobin: 14.2 g/dL (ref 12.0–15.0)
Immature Granulocytes: 0 %
Lymphocytes Relative: 24 %
Lymphs Abs: 1.9 10*3/uL (ref 0.7–4.0)
MCH: 32.2 pg (ref 26.0–34.0)
MCHC: 35.6 g/dL (ref 30.0–36.0)
MCV: 90.5 fL (ref 80.0–100.0)
Monocytes Absolute: 0.6 10*3/uL (ref 0.1–1.0)
Monocytes Relative: 8 %
Neutro Abs: 4.6 10*3/uL (ref 1.7–7.7)
Neutrophils Relative %: 58 %
Platelet Count: 275 10*3/uL (ref 150–400)
RBC: 4.41 MIL/uL (ref 3.87–5.11)
RDW: 12.8 % (ref 11.5–15.5)
WBC Count: 7.9 10*3/uL (ref 4.0–10.5)
nRBC: 0 % (ref 0.0–0.2)

## 2022-03-03 LAB — CMP (CANCER CENTER ONLY)
ALT: 28 U/L (ref 0–44)
AST: 34 U/L (ref 15–41)
Albumin: 4.3 g/dL (ref 3.5–5.0)
Alkaline Phosphatase: 79 U/L (ref 38–126)
Anion gap: 7 (ref 5–15)
BUN: 16 mg/dL (ref 6–20)
CO2: 25 mmol/L (ref 22–32)
Calcium: 8.8 mg/dL — ABNORMAL LOW (ref 8.9–10.3)
Chloride: 106 mmol/L (ref 98–111)
Creatinine: 0.89 mg/dL (ref 0.44–1.00)
GFR, Estimated: >60 mL/min (ref >60)
Glucose, Bld: 86 mg/dL (ref 70–99)
Potassium: 3.9 mmol/L (ref 3.5–5.1)
Sodium: 138 mmol/L (ref 135–145)
Total Bilirubin: 0.7 mg/dL (ref 0.3–1.2)
Total Protein: 7.5 g/dL (ref 6.5–8.1)

## 2022-03-03 LAB — HEPATITIS B CORE ANTIBODY, TOTAL: Hep B Core Total Ab: NONREACTIVE

## 2022-03-03 LAB — HEPATITIS B SURFACE ANTIGEN: Hepatitis B Surface Ag: NONREACTIVE

## 2022-03-03 MED ORDER — PREDNISONE 20 MG PO TABS: 40.0000 mg | ORAL_TABLET | Freq: Every day | ORAL | 0 refills | Status: DC

## 2022-03-03 NOTE — Progress Notes (Signed)
[facility] OFFICE PROGRESS NOTE  Patient Care Team: Medicine, [facility] as PCP - General (Family Medicine) Jarome Matin, MD as Consulting Physician (Dermatology) Horald Pollen, MD as Referring Physician (Internal Medicine)  ASSESSMENT & PLAN:  Marginal zone lymphoma of lymph nodes of neck ([facility]) I have reviewed imaging study with the patient She has recurrence at the tumor bed but no evidence of disease elsewhere We discussed the current guidelines and treatment options Due to location of disease recurrence on the face, I am not enthusiastic to recommend radiation treatment We discussed the risk and benefits of anti-CD20 with rituximab I will get blood work done today Due to symptomatic discomfort on the right side of her face, I recommend prednisone therapy We discussed risk, benefits, side effects of prednisone and she is in agreement to proceed  Neuropathic pain She has pain on her right side of the face due to cancer recurrence I recommend prednisone therapy  Orders Placed This Encounter  Procedures   Hepatitis B surface antigen    Standing Status:   Standing    Number of Occurrences:   1    Standing Expiration Date:   03/04/2023   Hepatitis B core antibody, total    Standing Status:   Standing    Number of Occurrences:   1    Standing Expiration Date:   03/04/2023   CMP ([facility] only)    Standing Status:   Future    Number of Occurrences:   1    Standing Expiration Date:   03/04/2023   CBC with Differential (Cancer Center Only)    Standing Status:   Future    Number of Occurrences:   1    Standing Expiration Date:   03/04/2023    All questions were answered. The patient knows to call the clinic with any problems, questions or concerns. The total time spent in the appointment was 40 minutes encounter with patients including review of chart and various tests results, discussions about plan of care and coordination of care  plan   Heath Lark, MD 03/03/2022 4:26 PM  INTERVAL HISTORY: Please see below for problem oriented charting. she returns for review of imaging studies She complains of facial pain She is here accompanied by her best friend We discussed test results, reviewed imaging study and discussed treatment plan  REVIEW OF SYSTEMS:   Constitutional: Denies fevers, chills or abnormal weight loss Eyes: Denies blurriness of vision Ears, nose, mouth, throat, and face: Denies mucositis or sore throat Respiratory: Denies cough, dyspnea or wheezes Cardiovascular: Denies palpitation, chest discomfort or lower extremity swelling Gastrointestinal:  Denies nausea, heartburn or change in bowel habits Skin: Denies abnormal skin rashes Lymphatics: Denies new lymphadenopathy or easy bruising Neurological:Denies numbness, tingling or new weaknesses Behavioral/Psych: Mood is stable, no new changes  All other systems were reviewed with the patient and are negative.  I have reviewed the past medical history, past surgical history, social history and family history with the patient and they are unchanged from previous note.  ALLERGIES:  is allergic to contrave [naltrexone-bupropion hcl er] and oxycodone-acetaminophen er.  MEDICATIONS:  Current Outpatient Medications  Medication Sig Dispense Refill   predniSONE (DELTASONE) 20 MG tablet Take 2 tablets (40 mg total) by mouth daily with breakfast. 30 tablet 0   albuterol (PROVENTIL HFA;VENTOLIN HFA) 108 (90 Base) MCG/ACT inhaler Inhale 1-2 puffs into the lungs every 4 (four) hours as needed for wheezing or shortness of breath. 1 Inhaler 5  levothyroxine (SYNTHROID) 88 MCG tablet TAKE 1 TABLET BY MOUTH EVERY DAY BEFORE BREAKFAST 90 tablet 1   No current facility-administered medications for this visit.    SUMMARY OF ONCOLOGIC HISTORY: Oncology History  Marginal zone lymphoma of lymph nodes of neck ([facility])  02/07/2020 Imaging   Right parotid and adjacent  subcutaneous mass without diagnostic feature. Lymphoma, sarcoid, or IgG 4 related disease are considered given the infiltrative appearance. If a primary parotid neoplasm the extent of the mass would be concerning for aggressive disease. The lesion extends to the subcutaneous preauricular space and should be readily amenable to biopsy.   03/04/2020 Surgery   Procedure: Right superficial parotidectomy with dissection of facial nerve Surgeon: Redmond Baseman Anesth: General and local with 1% lidocaine with 1:100,000 epinephrine Findings: Right superior parotid with superficial mass dissected easily from nerve branches.   03/04/2020 Pathology Results   FINAL MICROSCOPIC DIAGNOSIS:   A. PAROTID, RIGHT, PAROTIDECTOMY:  - Atypical lymphoid population, see comment.  - Benign salivary gland tissue.   COMMENT:   There is effacement of the salivary gland tissue by a monotonous population of small lymphoid cells with moderate clear cytoplasm. There are lymphoepithelial type lesions. Immunohistochemistry reveals the atypical lymphoid cells are positive for CD20 and bcl-2. There are residual disrupted germinal centers (CD23, bcl-6, CD10-positive; bcl-2 negative). CD3, CD43, and CD5 reveal admixed T-cells. CyclinD1 is negative. Ki-67 is overall low and elevated in residual germinal centers. CD138 reveals only scattered plasma cells which appear polytypic by light chain in situ hybridization. 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 While the morphology is suspicious, there is no aberrant phenotype or monotypic plasma cell population. Thus, PCR for B-cell gene rearrangement will be ordered to assess clonality (results to be reported in an addendum).  ADDENDUM:   PCR for B-cell gene rearrangement is POSITIVE.   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    04/06/2020 PET scan   1. Low level FDG uptake in the right parotid bed compatible with the history of recent surgery. 2. No evidence for hypermetabolic metastatic disease in the neck, chest, abdomen, or pelvis. 3. Diffuse hypermetabolism in the thyroid parenchyma, likely inflammatory.   04/06/2020 Cancer Staging   Staging form: Hodgkin and Non-Hodgkin Lymphoma, AJCC 8th Edition - Clinical stage from 04/06/2020: Stage I (Marginal zone lymphoma) - Signed by Heath Lark, MD on 04/06/2020   02/17/2022 Imaging   1. Irregular infiltrative appearing area of soft tissue thickening and contrast enhancement involving the right parotid gland and adjacent preauricular subcutaneous and skin. This extends inferiorly approximately to the level of the mandibular angle and superiorly beyond the field of view. Findings are more pronounced than expected for postsurgical changes. Findings are concerning for recurrent lymphoma. No lymphadenopathy identified. 2. Enlarged uniformly enhancing thyroid gland, similar to prior CT   03/03/2022 PET scan   1. Focal area of increased metabolic activity in the RIGHT pre-auricular region corresponding to the area of concern on CT imaging. Findings remain concerning for recurrent lymphoma. Showing greater FDG uptake than pretreatment images and if documented as recurrence would be compatible with Deauville criteria 5 disease. 2. Marked enlargement of the thyroid gland with increased metabolic activity. Findings may relate to thyroiditis. Correlate with thyroid function. Metabolic activity while diffuse on the previous study has increased in the interval.  In the absence of known or biochemical evidence of current active thyroiditis would also consider thyroid ultrasound and biopsy as warranted given that there is likely recurrence in the neck. 3. No additional sites of  disease identified. 4. Normal spleen and normal liver. 5. Small non FDG avid lymph nodes at the thoracic inlet are unchanged dating back to 2021.     03/14/2022 -  Chemotherapy   Patient is on Treatment Plan : ITP Rituximab q7d x 4 cycles       PHYSICAL EXAMINATION: ECOG PERFORMANCE STATUS: 1 - Symptomatic but completely ambulatory  Vitals:   03/03/22 1442  BP: 129/84  Pulse: 89  Resp: 18  Temp: 98.6 F (37 C)  SpO2: 98%   Filed Weights   03/03/22 1442  Weight: 193 lb 3.2 oz (87.6 kg)    GENERAL:alert, no distress and comfortable NEURO: alert & oriented x 3 with fluent speech, no focal motor/sensory deficits  LABORATORY DATA:  I have reviewed the data as listed    Component Value Date/Time   NA 138 03/03/2022 1510   NA 139 07/09/2016 1052   K 3.9 03/03/2022 1510   CL 106 03/03/2022 1510   CO2 25 03/03/2022 1510   GLUCOSE 86 03/03/2022 1510   BUN 16 03/03/2022 1510   BUN 7 07/09/2016 1052   CREATININE 0.89 03/03/2022 1510   CREATININE 0.95 04/20/2016 1632   CALCIUM 8.8 (L) 03/03/2022 1510   PROT 7.5 03/03/2022 1510   PROT 6.7 07/09/2016 1052   ALBUMIN 4.3 03/03/2022 1510   ALBUMIN 4.1 07/09/2016 1052   AST 34 03/03/2022 1510   ALT 28 03/03/2022 1510   ALKPHOS 79 03/03/2022 1510   BILITOT 0.7 03/03/2022 1510   GFRNONAA >60 03/03/2022 1510   GFRNONAA 74 04/20/2016 1632   GFRAA 121 07/09/2016 1052   GFRAA 85 04/20/2016 1632    No results found for: "SPEP", "UPEP"  Lab Results  Component Value Date   WBC 7.9 03/03/2022   NEUTROABS 4.6 03/03/2022   HGB 14.2 03/03/2022   HCT 39.9 03/03/2022   MCV 90.5 03/03/2022   PLT 275 03/03/2022      Chemistry      Component Value Date/Time   NA 138 03/03/2022 1510   NA 139 07/09/2016 1052   K 3.9 03/03/2022 1510   CL 106 03/03/2022 1510   CO2 25 03/03/2022 1510   BUN 16 03/03/2022 1510   BUN 7 07/09/2016 1052   CREATININE 0.89 03/03/2022 1510   CREATININE 0.95 04/20/2016 1632      Component Value  Date/Time   CALCIUM 8.8 (L) 03/03/2022 1510   ALKPHOS 79 03/03/2022 1510   AST 34 03/03/2022 1510   ALT 28 03/03/2022 1510   BILITOT 0.7 03/03/2022 1510       RADIOGRAPHIC STUDIES: I have reviewed multiple imaging studies with the patient I have personally reviewed the radiological images as listed and agreed with the findings in the report. NM PET Image Restage (PS) Skull Base to Thigh (F-18 FDG)  Result Date: 03/03/2022 CLINICAL DATA:  Subsequent treatment strategy for lymphoma, suspected recurrence in the RIGHT parotid area. EXAM: NUCLEAR MEDICINE PET SKULL BASE TO THIGH TECHNIQUE: 10.3 mCi F-18 FDG was injected intravenously. Full-ring PET imaging was performed from the skull base to thigh after the radiotracer. CT data was obtained and used for attenuation correction and anatomic localization. Fasting blood glucose: 90 mg/dl COMPARISON:  September of 2021 and October of 2023 CT of the neck. FINDINGS: Mediastinal blood pool activity:  SUV max 2.44 Liver activity: SUV max 2.98 NECK: In the area of concern in the RIGHT pre-auricular region and extending along the RIGHT mandible there is no change with respect to the CT appearance of this area in the short interval and there is increased metabolic activity with a maximum SUV of 5.89 (image 21/4) on image 24/4 this area measures 3.6 x 1.4 cm greatest axial dimension. Diffuse and marked increase throughout the thyroid gland is increased as compared to previous imaging with a maximum SUV of 9.59 as compared to 5.2. No discrete enlarged lymph nodes with increased metabolic activity in the neck. Incidental CT findings: None. CHEST: No hypermetabolic mediastinal or hilar nodes. No suspicious pulmonary nodules on the CT scan. Incidental CT findings: Small non FDG avid lymph nodes at the thoracic inlet are unchanged dating back to 2021. ABDOMEN/PELVIS: No abnormal hypermetabolic activity within the liver, pancreas, adrenal glands, or spleen. No hypermetabolic  lymph nodes in the abdomen or pelvis. Spleen is normal size. Incidental CT findings: No acute findings in the abdomen. SKELETON: No focal hypermetabolic activity to suggest skeletal metastasis. Incidental CT findings: None. IMPRESSION: 1. Focal area of increased metabolic activity in the RIGHT pre-auricular region corresponding to the area of concern on CT imaging. Findings remain concerning for recurrent lymphoma. Showing greater FDG uptake than pretreatment images and if documented as recurrence would be compatible with Deauville criteria 5 disease. 2. Marked enlargement of the thyroid gland with increased metabolic activity. Findings may relate to thyroiditis. Correlate with thyroid function. Metabolic activity while diffuse on the previous study has increased in the interval. In the absence of known or biochemical evidence of current active thyroiditis would also consider thyroid ultrasound and biopsy as warranted given that there is likely recurrence in the neck. 3. No additional sites of disease identified. 4. Normal spleen and normal liver. 5. Small non FDG avid lymph nodes at the thoracic inlet are unchanged dating back to 2021. Electronically Signed   By: Zetta Bills M.D.   On: 03/03/2022 09:52   CT SOFT TISSUE NECK W CONTRAST  Result Date: 02/17/2022 CLINICAL DATA:  Non-Hodgkin lymphoma of lymph nodes of head, unspecified non-Hodgkin lymphoma type (H[facility]) C85.91 (ICD-10-CM). EXAM: CT NECK WITH CONTRAST TECHNIQUE: Multidetector CT imaging of the neck was performed using the standard protocol following the bolus administration of intravenous contrast. RADIATION DOSE REDUCTION: This exam was performed according to the departmental dose-optimization program which includes automated exposure control, adjustment of the mA and/or kV according to patient size and/or use of iterative reconstruction technique. CONTRAST:  38m ISOVUE-300 IOPAMIDOL (ISOVUE-300) INJECTION 61% COMPARISON:  None Available. FINDINGS:  Pharynx and larynx: Normal. No mass or swelling. Salivary glands: Irregular infiltrative appearing area of soft tissue thickening and contrast enhancement involving the right parotid gland and adjacent preauricular subcutaneous and skin. This extends inferiorly approximately to the level of the mandibular angle and superiorly beyond the field of view. Findings are more pronounced than expected for postsurgical changes. The left parotid gland, bilateral sublingual and submandibular glands are unremarkable. Thyroid: Enlarged uniformly enhancing thyroid gland, similar to prior CT. Lymph nodes: No lymphadenopathy identified. Vascular: Negative. Limited intracranial: Negative. Visualized orbits: Minimally included in the field of view. No abnormality identified. Mastoids and visualized paranasal sinuses: Clear. Skeleton: No acute or aggressive process. Degenerative changes of the cervical spine. Upper chest: Negative. Other: None. IMPRESSION: 1. Irregular infiltrative appearing area of soft tissue thickening and contrast enhancement involving the right parotid gland and adjacent preauricular subcutaneous and skin. This  extends inferiorly approximately to the level of the mandibular angle and superiorly beyond the field of view. Findings are more pronounced than expected for postsurgical changes. Findings are concerning for recurrent lymphoma. No lymphadenopathy identified. 2. Enlarged uniformly enhancing thyroid gland, similar to prior CT. Electronically Signed   By: Pedro Earls M.D.   On: 02/17/2022 12:48

## 2022-03-03 NOTE — Progress Notes (Addendum)
ALERT: Recent [facility] Treatment decision is outdated. Please await next [facility] decision 

## 2022-03-03 NOTE — Assessment & Plan Note (Addendum)
I have reviewed imaging study with the patient She has recurrence at the tumor bed but no evidence of disease elsewhere We discussed the current guidelines and treatment options Due to location of disease recurrence on the face, I am not enthusiastic to recommend radiation treatment We discussed the risk and benefits of anti-CD20 with rituximab I will get blood work done today Due to symptomatic discomfort on the right side of her face, I recommend prednisone therapy We discussed risk, benefits, side effects of prednisone and she is in agreement to proceed

## 2022-03-03 NOTE — Progress Notes (Signed)
Patient on plan of care prior to [facility]. 

## 2022-03-03 NOTE — Assessment & Plan Note (Signed)
She has pain on her right side of the face due to cancer recurrence I recommend prednisone therapy

## 2022-03-03 NOTE — Telephone Encounter (Signed)
Patient had recent PET Scan and Dr requested that she reach out to you because there were some things there of concern. She wants to know if you can take a look at the scan and call her.

## 2022-03-03 NOTE — Progress Notes (Signed)
START OFF [facility] REGIMEN - Lymphoma and CLL   [id number]:Rituximab IV/SUBQ D1 q7 Days:   Cycle 1: A cycle is 7 days:     Rituximab-xxxx    Cycles 2 and beyond: A cycle is every 7 days:     Rituximab and hyaluronidase human   **Always confirm dose/schedule in your pharmacy ordering system**  Patient Characteristics: Marginal Zone Lymphoma, Localized, Other Disease Type: Marginal Zone Lymphoma Disease Type: Not Applicable Disease Type: Not Applicable Localized or Systemic Disease<= Localized Localized Disease Type: Other Intent of Therapy: Curative Intent, Discussed with Patient

## 2022-03-04 ENCOUNTER — Other Ambulatory Visit: Payer: Self-pay | Admitting: Hematology and Oncology

## 2022-03-04 ENCOUNTER — Telehealth: Payer: Self-pay

## 2022-03-04 ENCOUNTER — Other Ambulatory Visit: Payer: Self-pay

## 2022-03-04 NOTE — Telephone Encounter (Signed)
-----   Message from Heath Lark, MD sent at 03/04/2022 11:43 AM EDT ----- Related to her mychart msg It is likely due to seasonal allergies from changes in weather Please call her I can review that with her again when I see her on 10/30

## 2022-03-04 NOTE — Telephone Encounter (Signed)
Called and left below message regarding mychart message. Ask her to call the office back for questions. Ask her to look at appts in [place] and call the office for questions.

## 2022-03-08 NOTE — Telephone Encounter (Signed)
She is on schedule for this week

## 2022-03-08 NOTE — Progress Notes (Signed)
Pharmacist Chemotherapy Monitoring - Initial Assessment    Anticipated start date: 03/15/22   The following has been reviewed per standard work regarding the patient's treatment regimen: The patient's diagnosis, treatment plan and drug doses, and organ/hematologic function Lab orders and baseline tests specific to treatment regimen  The treatment plan start date, drug sequencing, and pre-medications Prior authorization status  Patient's documented medication list, including drug-drug interaction screen and prescriptions for anti-emetics and supportive care specific to the treatment regimen The drug concentrations, fluid compatibility, administration routes, and timing of the medications to be used The patient's access for treatment and lifetime cumulative dose history, if applicable  The patient's medication allergies and previous infusion related reactions, if applicable   Changes made to treatment plan:  N/A  Follow up needed:  Pending authorization for treatment    Larene Beach, [place], 03/08/2022  12:27 PM

## 2022-03-09 ENCOUNTER — Other Ambulatory Visit: Payer: Self-pay | Admitting: Endocrinology

## 2022-03-09 NOTE — Progress Notes (Deleted)
Patient ID: Sydney Vaughn, female   DOB: [birth year], 46 y.o.   MRN: [id number]             Reason for Appointment: Endocrinology follow-up    History of Present Illness:   WEIGHT management:  Previous history: She thinks her weight had been in the 170s a couple of years ago and has gained weight which she cannot shed Patient said that she has seen a dietitian in 2017 and is trying to modify her diet with taking salads for lunch, avoiding fried food usually She is trying to avoid snacking, trying to bake  foods as well as usually trying to avoid high-fat meats and avoiding sweets as well.  However she is usually trying to prepare food for her family with 4 children.   Her weight was about the same in July 2017  Since she had expressed difficulty with weight loss on her own with diet and exercise she was started on phentermine and Topamax on her initial visit in 07/2016   On her initial follow-up she had lost 18 pounds and had no side effects with the 2 drugs  In 2019 her Topamax was stopped because of her feeling that it was causing memory difficulties Also having periodic rapid heartbeat with phentermine low doses She was tried on Contrave and she had nausea and dizziness with the first tablet  RECENT history:  Previously in 2/21 she was having a rash with itching and Topamax was stopped by her PCP  Previously had no side effects with phentermine which she had been taking again since 9/22 However she says that after her last visit she felt jittery with taking phentermine and stopped it She tries to control her portions with her diet Does not feel any palpitations and does have a smart watch to monitor her pulse  Her weight is about the same as last year although she had lost a little weight earlier this year She has not exercised as much lately but is planning to join a gym She is asking for a weight loss medication like Ozempic She thinks she had significant lethargy with  Contrave  Wt Readings from Last 3 Encounters:  03/03/22 193 lb 3.2 oz (87.6 kg)  02/22/22 194 lb 9.6 oz (88.3 kg)  12/16/21 191 lb 9.6 oz (86.9 kg)    Hypothyroidism was first diagnosed in 11/2015  At the time of diagnosis patient was having symptoms of fatigue, weight gain and hair loss. She apparently has been having symptoms for several years the same way She has not had problems with cold intolerance except sometimes her feet get cold Baseline TSH was 7.7 and she was started on levothyroxine 25 g daily  On her initial consultation she was having symptoms of fatigue, having difficulty losing weight, fatigue on exercising and some hair loss although not as much, this is despite her taking the levothyroxine Also her thyroid functions including free T3 were normal  Recent history:  She has had less fatigue with relief of her stress situation  Currently taking 88 mcg levothyroxine, dose was reduced slightly when TSH was low normal in 9/22 She has been regular with taking her thyroid supplement soon after waking up   Thyroid function results have been as follows:  Lab Results  Component Value Date   TSH 1.19 12/13/2021   TSH 3.591 04/06/2021   TSH 0.35 02/02/2021   FREET4 0.76 12/13/2021   FREET4 0.85 02/02/2021   FREET4 0.79 08/21/2019    (ABNORMAL)  Thyroid Peroxidase Antibody (03/07/2022 2:32 PM EDT) Component Value Ref Range Test Method Analysis Time Performed At Pathologist Signature  Thyroid Peroxidase Ab 193        IMPRESSION:  1.  Heterogeneous and hypervascular thyroid gland which can be seen with thyroiditis.  2.  No thyroid nodules identified.   Past Medical History:  Diagnosis Date   Adult acne    ANA positive    Anemia    iron infusions   Asthma    Chronic sinusitis    CT and MRI.    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   Fibromyalgia    Foot fracture, left 08/2012   History of appendicitis 09/2006   History of blood transfusion     last time 02/2018 hysterectomy surgery   Hypothyroidism    Intramural uterine fibroid 2019   Iron deficiency    Menorrhagia    Metatarsal fracture 10/2012   Right Proximal fifth metatarsal fracture   Obesity    Panic attack    at bedtime, awakes from sleep   Pelvic pain    resolved   Polyarthralgia    was seeing rheumatology   PONV (postoperative nausea and vomiting)     Past Surgical History:  Procedure Laterality Date   ABDOMINAL HYSTERECTOMY  04/2018   APPENDECTOMY     BUNIONECTOMY Left    DILATION AND EVACUATION     PAROTIDECTOMY Right 03/04/2020   Procedure: right superficial PAROTIDECTOMY, with nerve preservation;  Surgeon: Melida Quitter, MD;  Location: [facility] OR;  Service: ENT;  Laterality: Right;   TUBAL LIGATION      Family History  Problem Relation Age of Onset   Thyroid disease Mother    Sinusitis Sister    Sinusitis Brother    Lupus Daughter        treated at [facility] Rheum   Allergies Daughter    Colon cancer Neg Hx    Colon polyps Neg Hx    Esophageal cancer Neg Hx    Rectal cancer Neg Hx    Stomach cancer Neg Hx     Social History:  reports that she has never smoked. She has never used smokeless tobacco. She reports that she does not currently use alcohol. She reports that she does not use drugs.  Allergies:   Allergies  Allergen Reactions   Contrave [Naltrexone-Bupropion Hcl Er] Nausea Only    Nausea and dizziness   Oxycodone-Acetaminophen Er     Swollen face, eyes and caused itching     Allergies as of 03/10/2022       Reactions   Contrave [naltrexone-bupropion Hcl Er] Nausea Only   Nausea and dizziness   Oxycodone-acetaminophen Er    Swollen face, eyes and caused itching         Medication List        Accurate as of March 09, 2022  8:29 PM. If you have any questions, ask your nurse or doctor.          albuterol 108 (90 Base) MCG/ACT inhaler Commonly known as: VENTOLIN HFA Inhale 1-2 puffs into the lungs every 4 (four) hours as  needed for wheezing or shortness of breath.   levothyroxine 88 MCG tablet Commonly known as: SYNTHROID TAKE 1 TABLET BY MOUTH EVERY DAY BEFORE BREAKFAST   predniSONE 20 MG tablet Commonly known as: DELTASONE Take 2 tablets (40 mg total) by mouth daily with breakfast.          Review of Systems  She had been taking venlafaxine for depression and anxiety long-term Also tried on Celexa but she has stopped this   No history of diabetes  Has borderline LDL levels  Lab Results  Component Value Date   HGBA1C 5.3 02/02/2021   HGBA1C 5.6 12/02/2015   HGBA1C 5.1 12/25/2012   Lab Results  Component Value Date   MICROALBUR 0.8 02/03/2016   LDLCALC 104 (H) 02/02/2021   CREATININE 0.89 03/03/2022          Examination:    LMP 04/14/2018     Assessment:  OBESITY:  Her highest BMI has been about 36 at baseline  She is only on low-dose phentermine and weight has leveled off, previously was gaining weight She also has had more issues with stress, some depression and insomnia  She thinks she can do somewhat better with exercise and will continue work on her diet  Follow-up in 6 months   HYPOTHYROIDISM, mild with highest TSH 7.7  She has fatigue but this is likely unrelated Her TSH is back to normal with 88 mcg levothyroxine   PLAN:  As above She will continue 88 mcg levothyroxine every morning   For her weight loss program she will look at the cost of Wegovy and 0.5 mg prescription sent Showed her the injection device and how this would be adjusted, discussed possible side effects of nausea She thinks that she can do the injection herself To start with 0.25 mg for 4 weeks and then 0.5 and continue to titrate every month, maximum dose 2 mg Patient brochure given and she will check on the coverage and also get a co-pay card online  Follow-up in 4 months  Rhylee Nunn Dwyane Dee 03/09/2022, 8:29 PM     Note: This office note was prepared with Dragon voice recognition  system technology. Any transcriptional errors that result from this process are unintentional.

## 2022-03-09 NOTE — Progress Notes (Signed)
The pharmacy team has substituted diphenhydramine for IV cetirizine as a premedication. Patient will be monitored for hypersensitivity reaction and adverse reactions to IV cetirizine. Thanks.    Kennith Center, Pharm.D., CPP 03/09/2022'@4'$ :42 PM

## 2022-03-10 ENCOUNTER — Telehealth: Payer: Self-pay

## 2022-03-10 ENCOUNTER — Ambulatory Visit: Admitting: Endocrinology

## 2022-03-10 NOTE — Telephone Encounter (Signed)
Called regarding mychart message. She is okay with not seeing Dr. Alvy Bimler at next appt. Requesting to move 10/31 infusion appt to 11/1. Scheduling message sent to move appt. Ask her to call the office next time instead of sending mychart message. The offices has not seen any message regarding changing appts.

## 2022-03-15 ENCOUNTER — Ambulatory Visit: Admitting: Hematology and Oncology

## 2022-03-15 ENCOUNTER — Ambulatory Visit

## 2022-03-16 ENCOUNTER — Inpatient Hospital Stay: Attending: Hematology and Oncology

## 2022-03-16 ENCOUNTER — Inpatient Hospital Stay (HOSPITAL_BASED_OUTPATIENT_CLINIC_OR_DEPARTMENT_OTHER): Admitting: Physician Assistant

## 2022-03-16 ENCOUNTER — Other Ambulatory Visit: Payer: Self-pay

## 2022-03-16 VITALS — BP 123/74 | HR 75 | Temp 98.2°F | Resp 18 | Ht 63.0 in | Wt 190.0 lb

## 2022-03-16 DIAGNOSIS — C884 Extranodal marginal zone B-cell lymphoma of mucosa-associated lymphoid tissue [MALT-lymphoma]: Secondary | ICD-10-CM | POA: Insufficient documentation

## 2022-03-16 DIAGNOSIS — C8581 Other specified types of non-Hodgkin lymphoma, lymph nodes of head, face, and neck: Secondary | ICD-10-CM

## 2022-03-16 DIAGNOSIS — T451X5A Adverse effect of antineoplastic and immunosuppressive drugs, initial encounter: Secondary | ICD-10-CM

## 2022-03-16 DIAGNOSIS — Z79899 Other long term (current) drug therapy: Secondary | ICD-10-CM | POA: Insufficient documentation

## 2022-03-16 DIAGNOSIS — Z5112 Encounter for antineoplastic immunotherapy: Secondary | ICD-10-CM | POA: Insufficient documentation

## 2022-03-16 DIAGNOSIS — B3731 Acute candidiasis of vulva and vagina: Secondary | ICD-10-CM | POA: Diagnosis not present

## 2022-03-16 DIAGNOSIS — F064 Anxiety disorder due to known physiological condition: Secondary | ICD-10-CM | POA: Insufficient documentation

## 2022-03-16 DIAGNOSIS — Z7952 Long term (current) use of systemic steroids: Secondary | ICD-10-CM | POA: Insufficient documentation

## 2022-03-16 MED ORDER — METHYLPREDNISOLONE SODIUM SUCC 125 MG IJ SOLR
125.0000 mg | Freq: Once | INTRAMUSCULAR | Status: AC | PRN
  Administered 2022-03-16: 125 mg via INTRAVENOUS

## 2022-03-16 MED ORDER — ACETAMINOPHEN 325 MG PO TABS
650.0000 mg | ORAL_TABLET | Freq: Once | ORAL | Status: AC
  Administered 2022-03-16: 650 mg via ORAL
  Filled 2022-03-16: qty 2

## 2022-03-16 MED ORDER — DIPHENHYDRAMINE HCL 50 MG/ML IJ SOLN
50.0000 mg | Freq: Once | INTRAMUSCULAR | Status: AC | PRN
  Administered 2022-03-16: 25 mg via INTRAVENOUS

## 2022-03-16 MED ORDER — FAMOTIDINE IN NACL 20-0.9 MG/50ML-% IV SOLN
20.0000 mg | Freq: Once | INTRAVENOUS | Status: AC | PRN
  Administered 2022-03-16: 20 mg via INTRAVENOUS

## 2022-03-16 MED ORDER — CETIRIZINE HCL 10 MG/ML IV SOLN
10.0000 mg | Freq: Once | INTRAVENOUS | Status: AC
  Administered 2022-03-16: 10 mg via INTRAVENOUS
  Filled 2022-03-16: qty 1

## 2022-03-16 MED ORDER — SODIUM CHLORIDE 0.9 % IV SOLN
375.0000 mg/m2 | Freq: Once | INTRAVENOUS | Status: AC
  Administered 2022-03-16: 700 mg via INTRAVENOUS
  Filled 2022-03-16: qty 20

## 2022-03-16 MED ORDER — SODIUM CHLORIDE 0.9 % IV SOLN: Freq: Once | INTRAVENOUS | Status: DC | PRN

## 2022-03-16 MED ORDER — SODIUM CHLORIDE 0.9 % IV SOLN: Freq: Once | INTRAVENOUS | Status: AC

## 2022-03-16 NOTE — Progress Notes (Signed)
Hypersensitivity Reaction note  Date of event: 03/16/22 Time of event: 1115 Generic name of drug involved: [place] Name of provider notified of the hypersensitivity reaction: Lisabeth Devoid, PA Was agent that likely caused hypersensitivity reaction added to Allergies List within EMR? Yes Chain of events including reaction signs/symptoms, treatment administered, and outcome (e.g., drug resumed; drug discontinued; sent to Emergency Department; etc.)  1115: Patient notified nurse of dizziness, headache, and nausea.  1118: 1L of NS administered and Kaitlyn, PA arrived to assess patient. Patient then stated she "felt like something was in her throat" 1123: Solumedrol '125mg'$  administered 1124: HR 84, BP 129/82, RR 18, 100% 1125: '25mg'$  benadryl administered 1130: Patient started to have relief of symptoms 1149: Patient back to baseline with relief of all symptoms and Rituximab infusion restarted at half of previous rate.   Juanetta Gosling, RN 03/16/2022 1:37 PM

## 2022-03-16 NOTE — Patient Instructions (Signed)
[facility] ONCOLOG[facility]  Discharge Instructions: Thank you for choosing [facility] to provide your oncology and hematology care.   If you have a lab appointment with the [facility], please go directly to the [facility] and check in at the registration area.   Wear comfortable clothing and clothing appropriate for easy access to any Portacath or PICC line.   We strive to give you quality time with your provider. You may need to reschedule your appointment if you arrive late (15 or more minutes).  Arriving late affects you and other patients whose appointments are after yours.  Also, if you miss three or more appointments without notifying the office, you may be dismissed from the clinic at the provider's discretion.      For prescription refill requests, have your pharmacy contact our office and allow 72 hours for refills to be completed.    Today you received the following chemotherapy and/or immunotherapy agents: Rituximab-abbs      To help prevent nausea and vomiting after your treatment, we encourage you to take your nausea medication as directed.  BELOW ARE SYMPTOMS THAT SHOULD BE REPORTED IMMEDIATELY: *FEVER GREATER THAN 100.4 F (38 C) OR HIGHER *CHILLS OR SWEATING *NAUSEA AND VOMITING THAT IS NOT CONTROLLED WITH YOUR NAUSEA MEDICATION *UNUSUAL SHORTNESS OF BREATH *UNUSUAL BRUISING OR BLEEDING *URINARY PROBLEMS (pain or burning when urinating, or frequent urination) *BOWEL PROBLEMS (unusual diarrhea, constipation, pain near the anus) TENDERNESS IN MOUTH AND THROAT WITH OR WITHOUT PRESENCE OF ULCERS (sore throat, sores in mouth, or a toothache) UNUSUAL RASH, SWELLING OR PAIN  UNUSUAL VAGINAL DISCHARGE OR ITCHING   Items with * indicate a potential emergency and should be followed up as soon as possible or go to the Emergency Department if any problems should occur.  Please show the CHEMOTHERAPY ALERT CARD or IMMUNOTHERAPY ALERT CARD at  check-in to the Emergency Department and triage nurse.  Should you have questions after your visit or need to cancel or reschedule your appointment, please contact [facility]  Dept: [phone]  and follow the prompts.  Office hours are 8:00 a.m. to 4:30 p.m. Monday - Friday. Please note that voicemails left after 4:00 p.m. may not be returned until the following business day.  We are closed weekends and major holidays. You have access to a nurse at all times for urgent questions. Please call the main number to the clinic Dept: [phone] and follow the prompts.   For any non-urgent questions, you may also contact your provider using MyChart. We now offer e-Visits for anyone 76 and older to request care online for non-urgent symptoms. For details visit [email or web address].   Also download the MyChart app! Go to the app store, search "MyChart", open the app, select [facility], and log in with your MyChart username and password.  Masks are optional in the cancer centers. If you would like for your care team to wear a mask while they are taking care of you, please let them know. You may have one support person who is at least 48 years old accompany you for your appointments.

## 2022-03-16 NOTE — Progress Notes (Signed)
    DATE:  03/16/22                                        X CHEMO/IMMUNOTHERAPY REACTION          MD: Dr. Burr Medico notified. Patient of Dr. Alvy Bimler who is out of office today   AGENT/BLOOD PRODUCT RECEIVING TODAY:              Rituxan   AGENT/BLOOD PRODUCT RECEIVING IMMEDIATELY PRIOR TO REACTION:          Rituxan   VS: BP:     142/84   P:       82       SPO2:       98% RA                BP:     129/82   P:       79       SPO2:       100% RA     REACTION(S):           headache, nausea, throat scratchy sensation   PREMEDS:     Tylenol 650 mg PO, Cetrizine 10 mg PO   INTERVENTION: Benadryl 25 mg IV, Pepcid 210 mg IV, Solumedrol 125 mg IV   Review of Systems  Review of Systems  Constitutional:  Negative for chills and fever.  Respiratory:  Negative for shortness of breath.   Cardiovascular:  Negative for chest pain.  Gastrointestinal:  Positive for nausea. Negative for abdominal pain and vomiting.  Musculoskeletal:  Negative for arthralgias, back pain and myalgias.  Neurological:  Positive for headaches.     Physical Exam  Physical Exam Vitals and nursing note reviewed.  Constitutional:      Appearance: She is well-developed. She is not ill-appearing or toxic-appearing.  HENT:     Head: Normocephalic.     Nose: Nose normal.  Eyes:     Conjunctiva/sclera: Conjunctivae normal.  Neck:     Vascular: No JVD.  Cardiovascular:     Rate and Rhythm: Normal rate and regular rhythm.     Pulses: Normal pulses.     Heart sounds: Normal heart sounds.  Pulmonary:     Effort: Pulmonary effort is normal. No respiratory distress.     Breath sounds: Normal breath sounds. No stridor. No wheezing, rhonchi or rales.  Chest:     Chest wall: No tenderness.  Abdominal:     General: There is no distension.  Musculoskeletal:     Cervical back: Normal range of motion.  Skin:    General: Skin is warm and dry.  Neurological:     Mental Status: She is oriented to person, place, and time.      OUTCOME:                Patient symptomatic into third bump up. Symptoms resolved after emergency medications given as documented as above. Dr. Burr Medico agrees with plan to re-challenge given reaction was mild. Patient tolerated remainder of treatment. Primary oncologist will be made aware or reaction.

## 2022-03-17 ENCOUNTER — Telehealth: Payer: Self-pay

## 2022-03-17 ENCOUNTER — Other Ambulatory Visit: Payer: Self-pay

## 2022-03-17 MED ORDER — PROCHLORPERAZINE MALEATE 10 MG PO TABS: 10.0000 mg | ORAL_TABLET | Freq: Four times a day (QID) | ORAL | 0 refills | Status: DC | PRN

## 2022-03-17 NOTE — Telephone Encounter (Signed)
Called and told her that she can stop the Prednisone. The facial swelling has resolved. She verbalized understanding. She is complaining of some nausea today after treatment yesterday. Sent Compazine Rx to pharmacy.

## 2022-03-17 NOTE — Telephone Encounter (Signed)
-----   Message from Sydney Gosling, RN sent at 03/16/2022  4:48 PM EDT ----- Regarding: First time rituxan, gorsuch pt First time rituxan. Patient had reaction on third bump up (dizziness, HA, nausea). Kaitlyn, [place] assessed administered benadryl, solumedrol, and pepcid and was able to restart and finish. Due for first time call back. Dr. Alvy Bimler patient.

## 2022-03-17 NOTE — Telephone Encounter (Signed)
Ms Pehrson states that she is doing pretty well. She states that she is tired, has a h/a, and some joint pain.  Told her that this is common after the treatment.  She can take tylenol for H/A and aches. She can also use heat to joints. She is eating small amounts of food.  She has not had a big appetite prior to the treatment yesterday.  Encourage her to take in at least 64 ounces of caffeine free fluid daily.  She is urinating well.  She knows to call the office at [phone] if she has any questions or concerns.  She did want to know if she needs to continue her  prednisone 20 mg daily.  She did not take it yesterday as she was concerned that it might interfere with her treatment and possibly affect her heart.  Told her that this message would be sent to Dr. Alvy Bimler for review and her nurse would call her back regarding the prednisone.

## 2022-03-17 NOTE — Telephone Encounter (Signed)
Sydney Vaughn,  The prednisone is for her face swelling She can stop taking it now

## 2022-03-21 ENCOUNTER — Other Ambulatory Visit: Payer: Self-pay | Admitting: Hematology and Oncology

## 2022-03-21 ENCOUNTER — Telehealth: Payer: Self-pay

## 2022-03-21 DIAGNOSIS — B3731 Acute candidiasis of vulva and vagina: Secondary | ICD-10-CM | POA: Insufficient documentation

## 2022-03-21 MED ORDER — FLUCONAZOLE 100 MG PO TABS: 100.0000 mg | ORAL_TABLET | Freq: Every day | ORAL | 0 refills | Status: DC

## 2022-03-21 NOTE — Telephone Encounter (Signed)
Returned her call. Yesterday she noticed that was having a little vaginal itching and white vaginal discharge. On her paper work she was given she was instructed to call the office immediately. She thinks she may have a yeast infection. She uses [facility] on [address].

## 2022-03-21 NOTE — Telephone Encounter (Signed)
Sent fluconazole I also want to make sure she has stopped prednisone

## 2022-03-21 NOTE — Telephone Encounter (Signed)
Called her back and given below message. She verbalized understanding and stopped the prednisone last week when the office called.

## 2022-03-22 ENCOUNTER — Other Ambulatory Visit: Payer: Self-pay

## 2022-03-22 ENCOUNTER — Inpatient Hospital Stay (HOSPITAL_BASED_OUTPATIENT_CLINIC_OR_DEPARTMENT_OTHER): Admitting: Hematology and Oncology

## 2022-03-22 ENCOUNTER — Inpatient Hospital Stay

## 2022-03-22 VITALS — BP 123/76 | HR 74 | Temp 98.0°F | Resp 16

## 2022-03-22 VITALS — BP 134/88 | HR 85 | Temp 97.8°F | Resp 18 | Ht 63.0 in | Wt 188.0 lb

## 2022-03-22 DIAGNOSIS — C8581 Other specified types of non-Hodgkin lymphoma, lymph nodes of head, face, and neck: Secondary | ICD-10-CM

## 2022-03-22 DIAGNOSIS — Z5112 Encounter for antineoplastic immunotherapy: Secondary | ICD-10-CM | POA: Diagnosis not present

## 2022-03-22 DIAGNOSIS — B3731 Acute candidiasis of vulva and vagina: Secondary | ICD-10-CM | POA: Diagnosis not present

## 2022-03-22 DIAGNOSIS — F064 Anxiety disorder due to known physiological condition: Secondary | ICD-10-CM | POA: Diagnosis not present

## 2022-03-22 MED ORDER — CETIRIZINE HCL 10 MG/ML IV SOLN
10.0000 mg | Freq: Once | INTRAVENOUS | Status: AC
  Administered 2022-03-22: 10 mg via INTRAVENOUS
  Filled 2022-03-22: qty 1

## 2022-03-22 MED ORDER — DIPHENHYDRAMINE HCL 25 MG PO CAPS
25.0000 mg | ORAL_CAPSULE | Freq: Once | ORAL | Status: AC
  Administered 2022-03-22: 25 mg via ORAL
  Filled 2022-03-22: qty 1

## 2022-03-22 MED ORDER — SODIUM CHLORIDE 0.9 % IV SOLN: Freq: Once | INTRAVENOUS | Status: AC

## 2022-03-22 MED ORDER — ACETAMINOPHEN 325 MG PO TABS
650.0000 mg | ORAL_TABLET | Freq: Once | ORAL | Status: AC
  Administered 2022-03-22: 650 mg via ORAL
  Filled 2022-03-22: qty 2

## 2022-03-22 MED ORDER — SODIUM CHLORIDE 0.9 % IV SOLN
375.0000 mg/m2 | Freq: Once | INTRAVENOUS | Status: AC
  Administered 2022-03-22: 700 mg via INTRAVENOUS
  Filled 2022-03-22: qty 50

## 2022-03-22 NOTE — Patient Instructions (Signed)
[facility] ONCOLOGY  Discharge Instructions: Thank you for choosing [facility] to provide your oncology and hematology care.   If you have a lab appointment with the [facility], please go directly to the [facility] and check in at the registration area.   Wear comfortable clothing and clothing appropriate for easy access to any Portacath or PICC line.   We strive to give you quality time with your provider. You may need to reschedule your appointment if you arrive late (15 or more minutes).  Arriving late affects you and other patients whose appointments are after yours.  Also, if you miss three or more appointments without notifying the office, you may be dismissed from the clinic at the provider's discretion.      For prescription refill requests, have your pharmacy contact our office and allow 72 hours for refills to be completed.    Today you received the following chemotherapy and/or immunotherapy agents: Truxima      To help prevent nausea and vomiting after your treatment, we encourage you to take your nausea medication as directed.  BELOW ARE SYMPTOMS THAT SHOULD BE REPORTED IMMEDIATELY: *FEVER GREATER THAN 100.4 F (38 C) OR HIGHER *CHILLS OR SWEATING *NAUSEA AND VOMITING THAT IS NOT CONTROLLED WITH YOUR NAUSEA MEDICATION *UNUSUAL SHORTNESS OF BREATH *UNUSUAL BRUISING OR BLEEDING *URINARY PROBLEMS (pain or burning when urinating, or frequent urination) *BOWEL PROBLEMS (unusual diarrhea, constipation, pain near the anus) TENDERNESS IN MOUTH AND THROAT WITH OR WITHOUT PRESENCE OF ULCERS (sore throat, sores in mouth, or a toothache) UNUSUAL RASH, SWELLING OR PAIN  UNUSUAL VAGINAL DISCHARGE OR ITCHING   Items with * indicate a potential emergency and should be followed up as soon as possible or go to the Emergency Department if any problems should occur.  Please show the CHEMOTHERAPY ALERT CARD or IMMUNOTHERAPY ALERT CARD at check-in to  the Emergency Department and triage nurse.  Should you have questions after your visit or need to cancel or reschedule your appointment, please contact [facility]  Dept: [phone]  and follow the prompts.  Office hours are 8:00 a.m. to 4:30 p.m. Monday - Friday. Please note that voicemails left after 4:00 p.m. may not be returned until the following business day.  We are closed weekends and major holidays. You have access to a nurse at all times for urgent questions. Please call the main number to the clinic Dept: [phone] and follow the prompts.   For any non-urgent questions, you may also contact your provider using MyChart. We now offer e-Visits for anyone 64 and older to request care online for non-urgent symptoms. For details visit [email or web address].   Also download the MyChart app! Go to the app store, search "MyChart", open the app, select [facility], and log in with your MyChart username and password.  Masks are optional in the cancer centers. If you would like for your care team to wear a mask while they are taking care of you, please let them know. You may have one support person who is at least 48 years old accompany you for your appointments.

## 2022-03-23 ENCOUNTER — Encounter: Payer: Self-pay | Admitting: Hematology and Oncology

## 2022-03-23 NOTE — Assessment & Plan Note (Signed)
She is being prescribed Diflucan since yesterday and is improving We will observe only

## 2022-03-23 NOTE — Assessment & Plan Note (Signed)
She is profoundly anxious I continue to reassure the patient that she is responding well to treatment and I anticipate complete remission with next imaging studies

## 2022-03-23 NOTE — Assessment & Plan Note (Signed)
She had slight reaction from cycle 1 of treatment that has completely resolved She has no palpable fullness at the site of recurrence and has stopped taking prednisone a week ago Her recent treatment with prednisone complicates and caused yeast infection which she is being treated with Diflucan We will proceed with treatment as scheduled She felt comfortable that she does not need to see me for the next 2 cycles I plan to repeat imaging study and blood work in January for objective assessment of response to therapy

## 2022-03-23 NOTE — Progress Notes (Signed)
[facility] OFFICE PROGRESS NOTE  Patient Care Team: Medicine, [facility] as PCP - General (Family Medicine) Jarome Matin, MD as Consulting Physician (Dermatology) Horald Pollen, MD as Referring Physician (Internal Medicine)  ASSESSMENT & PLAN:  Marginal zone lymphoma of lymph nodes of neck ([place]) She had slight reaction from cycle 1 of treatment that has completely resolved She has no palpable fullness at the site of recurrence and has stopped taking prednisone a week ago Her recent treatment with prednisone complicates and caused yeast infection which she is being treated with Diflucan We will proceed with treatment as scheduled She felt comfortable that she does not need to see me for the next 2 cycles I plan to repeat imaging study and blood work in January for objective assessment of response to therapy  Yeast infection involving the vagina and surrounding area She is being prescribed Diflucan since yesterday and is improving We will observe only  Anxiety disorder due to medical condition She is profoundly anxious I continue to reassure the patient that she is responding well to treatment and I anticipate complete remission with next imaging studies  Orders Placed This Encounter  Procedures   NM PET Image Restage (PS) Skull Base to Thigh (F-18 FDG)    Standing Status:   Future    Standing Expiration Date:   03/23/2023    Order Specific Question:   If indicated for the ordered procedure, I authorize the administration of a radiopharmaceutical per Radiology protocol    Answer:   Yes    Order Specific Question:   Preferred imaging location?    Answer:   [facility]    Order Specific Question:   Radiology Contrast Protocol - do NOT remove file path    Answer:   [email or web address]    Order Specific Question:   Is the patient pregnant?    Answer:   No   CMP ([place] only)    Standing  Status:   Future    Standing Expiration Date:   03/24/2023   CBC with Differential ([place] Only)    Standing Status:   Future    Standing Expiration Date:   03/24/2023   Lactate dehydrogenase    Standing Status:   Future    Standing Expiration Date:   03/24/2023    All questions were answered. The patient knows to call the clinic with any problems, questions or concerns. The total time spent in the appointment was 30 minutes encounter with patients including review of chart and various tests results, discussions about plan of care and coordination of care plan   Heath Lark, MD 03/23/2022 8:34 AM  INTERVAL HISTORY: Please see below for problem oriented charting. she returns for treatment follow-up seen prior to treatment #2 of rituximab She had slight reaction with rituximab for cycle 1 as well as recent yeast infection Overall, she felt better She denies further pain at the site of cancer recurrence  REVIEW OF SYSTEMS:   Constitutional: Denies fevers, chills or abnormal weight loss Eyes: Denies blurriness of vision Ears, nose, mouth, throat, and face: Denies mucositis or sore throat Respiratory: Denies cough, dyspnea or wheezes Cardiovascular: Denies palpitation, chest discomfort or lower extremity swelling Gastrointestinal:  Denies nausea, heartburn or change in bowel habits Skin: Denies abnormal skin rashes Lymphatics: Denies new lymphadenopathy or easy bruising Neurological:Denies numbness, tingling or new weaknesses Behavioral/Psych: Mood is stable, no new changes  All other systems were reviewed with the patient and are negative.  I have reviewed the past medical history, past surgical history, social history and family history with the patient and they are unchanged from previous note.  ALLERGIES:  is allergic to contrave [naltrexone-bupropion hcl er], oxycodone-acetaminophen er, and rituximab-abbs [rituximab].  MEDICATIONS:  Current Outpatient Medications   Medication Sig Dispense Refill   albuterol (PROVENTIL HFA;VENTOLIN HFA) 108 (90 Base) MCG/ACT inhaler Inhale 1-2 puffs into the lungs every 4 (four) hours as needed for wheezing or shortness of breath. 1 Inhaler 5   fluconazole (DIFLUCAN) 100 MG tablet Take 1 tablet (100 mg total) by mouth daily. 7 tablet 0   levothyroxine (SYNTHROID) 88 MCG tablet TAKE 1 TABLET BY MOUTH EVERY DAY BEFORE BREAKFAST 90 tablet 1   prochlorperazine (COMPAZINE) 10 MG tablet Take 1 tablet (10 mg total) by mouth every 6 (six) hours as needed for nausea or vomiting. 30 tablet 0   sertraline (ZOLOFT) 25 MG tablet Take 25 mg by mouth daily.     No current facility-administered medications for this visit.    SUMMARY OF ONCOLOGIC HISTORY: Oncology History  Marginal zone lymphoma of lymph nodes of neck ([place])  02/07/2020 Imaging   Right parotid and adjacent subcutaneous mass without diagnostic feature. Lymphoma, sarcoid, or IgG 4 related disease are considered given the infiltrative appearance. If a primary parotid neoplasm the extent of the mass would be concerning for aggressive disease. The lesion extends to the subcutaneous preauricular space and should be readily amenable to biopsy.   03/04/2020 Surgery   Procedure: Right superficial parotidectomy with dissection of facial nerve Surgeon: Redmond Baseman Anesth: General and local with 1% lidocaine with 1:100,000 epinephrine Findings: Right superior parotid with superficial mass dissected easily from nerve branches.   03/04/2020 Pathology Results   FINAL MICROSCOPIC DIAGNOSIS:   A. PAROTID, RIGHT, PAROTIDECTOMY:  - Atypical lymphoid population, see comment.  - Benign salivary gland tissue.   COMMENT:   There is effacement of the salivary gland tissue by a monotonous population of small lymphoid cells with moderate clear cytoplasm. There are lymphoepithelial type lesions. Immunohistochemistry reveals the atypical lymphoid cells are positive for CD20 and bcl-2. There are  residual disrupted germinal centers (CD23, bcl-6, CD10-positive; bcl-2 negative). CD3, CD43, and CD5 reveal admixed T-cells. CyclinD1 is negative. Ki-67 is overall low and elevated in residual germinal centers. CD138 reveals only scattered plasma cells which appear polytypic by light chain in situ hybridization. 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 While the morphology is suspicious, there is no aberrant phenotype or monotypic plasma cell population. Thus, PCR for B-cell gene rearrangement will be ordered to assess clonality (results to be reported in an addendum).  ADDENDUM:   PCR for B-cell gene rearrangement is POSITIVE.   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    04/06/2020 PET scan   1. Low level FDG uptake in the right parotid bed compatible with the history of recent surgery. 2. No evidence for hypermetabolic metastatic disease in the neck, chest, abdomen, or pelvis. 3. Diffuse hypermetabolism in the thyroid parenchyma, likely inflammatory.   04/06/2020 Cancer Staging   Staging form: Hodgkin and Non-Hodgkin Lymphoma, AJCC 8th Edition - Clinical stage from 04/06/2020: Stage I (Marginal zone lymphoma) - Signed by Heath Lark, MD on 04/06/2020   02/17/2022 Imaging   1. Irregular infiltrative appearing area of soft tissue thickening  and contrast enhancement involving the right parotid gland and adjacent preauricular subcutaneous and skin. This extends inferiorly approximately to the level of the mandibular angle and superiorly beyond the field of view. Findings are more pronounced than expected for postsurgical changes. Findings are concerning for recurrent lymphoma. No lymphadenopathy identified. 2. Enlarged  uniformly enhancing thyroid gland, similar to prior CT   03/03/2022 PET scan   1. Focal area of increased metabolic activity in the RIGHT pre-auricular region corresponding to the area of concern on CT imaging. Findings remain concerning for recurrent lymphoma. Showing greater FDG uptake than pretreatment images and if documented as recurrence would be compatible with Deauville criteria 5 disease. 2. Marked enlargement of the thyroid gland with increased metabolic activity. Findings may relate to thyroiditis. Correlate with thyroid function. Metabolic activity while diffuse on the previous study has increased in the interval. In the absence of known or biochemical evidence of current active thyroiditis would also consider thyroid ultrasound and biopsy as warranted given that there is likely recurrence in the neck. 3. No additional sites of disease identified. 4. Normal spleen and normal liver. 5. Small non FDG avid lymph nodes at the thoracic inlet are unchanged dating back to 2021.     03/16/2022 -  Chemotherapy   Patient is on Treatment Plan : ITP Rituximab q7d x 4 cycles       PHYSICAL EXAMINATION: ECOG PERFORMANCE STATUS: 0 - Asymptomatic  Vitals:   03/22/22 1022  BP: 134/88  Pulse: 85  Resp: 18  Temp: 97.8 F (36.6 C)  SpO2: 98%   Filed Weights   03/22/22 1022  Weight: 188 lb (85.3 kg)    GENERAL:alert, no distress and comfortable SKIN: skin color, texture, turgor are normal, no rashes or significant lesions EYES: normal, Conjunctiva are pink and non-injected, sclera clear OROPHARYNX:no exudate, no erythema and lips, buccal mucosa, and tongue normal  NECK: supple, thyroid normal size, non-tender, without nodularity LYMPH:  no palpable lymphadenopathy in the cervical, axillary or inguinal LUNGS: clear to auscultation and percussion with normal breathing effort HEART: regular rate & rhythm and no murmurs and no lower extremity edema ABDOMEN:abdomen soft, non-tender and  normal bowel sounds Musculoskeletal:no cyanosis of digits and no clubbing  NEURO: alert & oriented x 3 with fluent speech, no focal motor/sensory deficits  LABORATORY DATA:  I have reviewed the data as listed    Component Value Date/Time   NA 138 03/03/2022 1510   NA 139 07/09/2016 1052   K 3.9 03/03/2022 1510   CL 106 03/03/2022 1510   CO2 25 03/03/2022 1510   GLUCOSE 86 03/03/2022 1510   BUN 16 03/03/2022 1510   BUN 7 07/09/2016 1052   CREATININE 0.89 03/03/2022 1510   CREATININE 0.95 04/20/2016 1632   CALCIUM 8.8 (L) 03/03/2022 1510   PROT 7.5 03/03/2022 1510   PROT 6.7 07/09/2016 1052   ALBUMIN 4.3 03/03/2022 1510   ALBUMIN 4.1 07/09/2016 1052   AST 34 03/03/2022 1510   ALT 28 03/03/2022 1510   ALKPHOS 79 03/03/2022 1510   BILITOT 0.7 03/03/2022 1510   GFRNONAA >60 03/03/2022 1510   GFRNONAA 74 04/20/2016 1632   GFRAA 121 07/09/2016 1052   GFRAA 85 04/20/2016 1632    No results found for: "SPEP", "UPEP"  Lab Results  Component Value Date   WBC 7.9 03/03/2022   NEUTROABS 4.6 03/03/2022   HGB 14.2 03/03/2022   HCT 39.9 03/03/2022   MCV 90.5 03/03/2022   PLT 275 03/03/2022  Chemistry      Component Value Date/Time   NA 138 03/03/2022 1510   NA 139 07/09/2016 1052   K 3.9 03/03/2022 1510   CL 106 03/03/2022 1510   CO2 25 03/03/2022 1510   BUN 16 03/03/2022 1510   BUN 7 07/09/2016 1052   CREATININE 0.89 03/03/2022 1510   CREATININE 0.95 04/20/2016 1632      Component Value Date/Time   CALCIUM 8.8 (L) 03/03/2022 1510   ALKPHOS 79 03/03/2022 1510   AST 34 03/03/2022 1510   ALT 28 03/03/2022 1510   BILITOT 0.7 03/03/2022 1510       RADIOGRAPHIC STUDIES: I have personally reviewed the radiological images as listed and agreed with the findings in the report. NM PET Image Restage (PS) Skull Base to Thigh (F-18 FDG)  Result Date: 03/03/2022 CLINICAL DATA:  Subsequent treatment strategy for lymphoma, suspected recurrence in the RIGHT parotid area.  EXAM: NUCLEAR MEDICINE PET SKULL BASE TO THIGH TECHNIQUE: 10.3 mCi F-18 FDG was injected intravenously. Full-ring PET imaging was performed from the skull base to thigh after the radiotracer. CT data was obtained and used for attenuation correction and anatomic localization. Fasting blood glucose: 90 mg/dl COMPARISON:  September of 2021 and October of 2023 CT of the neck. FINDINGS: Mediastinal blood pool activity: SUV max 2.44 Liver activity: SUV max 2.98 NECK: In the area of concern in the RIGHT pre-auricular region and extending along the RIGHT mandible there is no change with respect to the CT appearance of this area in the short interval and there is increased metabolic activity with a maximum SUV of 5.89 (image 21/4) on image 24/4 this area measures 3.6 x 1.4 cm greatest axial dimension. Diffuse and marked increase throughout the thyroid gland is increased as compared to previous imaging with a maximum SUV of 9.59 as compared to 5.2. No discrete enlarged lymph nodes with increased metabolic activity in the neck. Incidental CT findings: None. CHEST: No hypermetabolic mediastinal or hilar nodes. No suspicious pulmonary nodules on the CT scan. Incidental CT findings: Small non FDG avid lymph nodes at the thoracic inlet are unchanged dating back to 2021. ABDOMEN/PELVIS: No abnormal hypermetabolic activity within the liver, pancreas, adrenal glands, or spleen. No hypermetabolic lymph nodes in the abdomen or pelvis. Spleen is normal size. Incidental CT findings: No acute findings in the abdomen. SKELETON: No focal hypermetabolic activity to suggest skeletal metastasis. Incidental CT findings: None. IMPRESSION: 1. Focal area of increased metabolic activity in the RIGHT pre-auricular region corresponding to the area of concern on CT imaging. Findings remain concerning for recurrent lymphoma. Showing greater FDG uptake than pretreatment images and if documented as recurrence would be compatible with Deauville criteria 5  disease. 2. Marked enlargement of the thyroid gland with increased metabolic activity. Findings may relate to thyroiditis. Correlate with thyroid function. Metabolic activity while diffuse on the previous study has increased in the interval. In the absence of known or biochemical evidence of current active thyroiditis would also consider thyroid ultrasound and biopsy as warranted given that there is likely recurrence in the neck. 3. No additional sites of disease identified. 4. Normal spleen and normal liver. 5. Small non FDG avid lymph nodes at the thoracic inlet are unchanged dating back to 2021. Electronically Signed   By: Zetta Bills M.D.   On: 03/03/2022 09:52

## 2022-03-24 ENCOUNTER — Other Ambulatory Visit: Payer: Self-pay

## 2022-03-28 ENCOUNTER — Ambulatory Visit: Admitting: Hematology and Oncology

## 2022-03-28 ENCOUNTER — Other Ambulatory Visit: Payer: Self-pay

## 2022-03-28 ENCOUNTER — Inpatient Hospital Stay

## 2022-03-28 VITALS — BP 118/77 | HR 68 | Temp 98.2°F | Resp 18 | Wt 191.2 lb

## 2022-03-28 DIAGNOSIS — C8581 Other specified types of non-Hodgkin lymphoma, lymph nodes of head, face, and neck: Secondary | ICD-10-CM

## 2022-03-28 DIAGNOSIS — Z5112 Encounter for antineoplastic immunotherapy: Secondary | ICD-10-CM | POA: Diagnosis not present

## 2022-03-28 MED ORDER — ACETAMINOPHEN 325 MG PO TABS
650.0000 mg | ORAL_TABLET | Freq: Once | ORAL | Status: AC
  Administered 2022-03-28: 650 mg via ORAL
  Filled 2022-03-28: qty 2

## 2022-03-28 MED ORDER — CETIRIZINE HCL 10 MG/ML IV SOLN
10.0000 mg | Freq: Once | INTRAVENOUS | Status: AC
  Administered 2022-03-28: 10 mg via INTRAVENOUS
  Filled 2022-03-28: qty 1

## 2022-03-28 MED ORDER — SODIUM CHLORIDE 0.9 % IV SOLN: Freq: Once | INTRAVENOUS | Status: AC

## 2022-03-28 MED ORDER — SODIUM CHLORIDE 0.9 % IV SOLN
375.0000 mg/m2 | Freq: Once | INTRAVENOUS | Status: AC
  Administered 2022-03-28: 700 mg via INTRAVENOUS
  Filled 2022-03-28: qty 50

## 2022-03-28 MED ORDER — DIPHENHYDRAMINE HCL 25 MG PO CAPS
25.0000 mg | ORAL_CAPSULE | Freq: Once | ORAL | Status: AC
  Administered 2022-03-28: 25 mg via ORAL
  Filled 2022-03-28: qty 1

## 2022-03-28 NOTE — Patient Instructions (Signed)
[facility] MEDICAL ONCOLOGY  Discharge Instructions: Thank you for choosing [facility] to provide your oncology and hematology care.   If you have a lab appointment with the Cancer Center, please go directly to the Cancer Center and check in at the registration area.   Wear comfortable clothing and clothing appropriate for easy access to any Portacath or PICC line.   We strive to give you quality time with your provider. You may need to reschedule your appointment if you arrive late (15 or more minutes).  Arriving late affects you and other patients whose appointments are after yours.  Also, if you miss three or more appointments without notifying the office, you may be dismissed from the clinic at the provider's discretion.      For prescription refill requests, have your pharmacy contact our office and allow 72 hours for refills to be completed.    Today you received the following chemotherapy and/or immunotherapy agents : Rituximab      To help prevent nausea and vomiting after your treatment, we encourage you to take your nausea medication as directed.  BELOW ARE SYMPTOMS THAT SHOULD BE REPORTED IMMEDIATELY: *FEVER GREATER THAN 100.4 F (38 C) OR HIGHER *CHILLS OR SWEATING *NAUSEA AND VOMITING THAT IS NOT CONTROLLED WITH YOUR NAUSEA MEDICATION *UNUSUAL SHORTNESS OF BREATH *UNUSUAL BRUISING OR BLEEDING *URINARY PROBLEMS (pain or burning when urinating, or frequent urination) *BOWEL PROBLEMS (unusual diarrhea, constipation, pain near the anus) TENDERNESS IN MOUTH AND THROAT WITH OR WITHOUT PRESENCE OF ULCERS (sore throat, sores in mouth, or a toothache) UNUSUAL RASH, SWELLING OR PAIN  UNUSUAL VAGINAL DISCHARGE OR ITCHING   Items with * indicate a potential emergency and should be followed up as soon as possible or go to the Emergency Department if any problems should occur.  Please show the CHEMOTHERAPY ALERT CARD or IMMUNOTHERAPY ALERT CARD at check-in to  the Emergency Department and triage nurse.  Should you have questions after your visit or need to cancel or reschedule your appointment, please contact [facility] MEDICAL ONCOLOGY  Dept: [phone]  and follow the prompts.  Office hours are 8:00 a.m. to 4:30 p.m. Monday - Friday. Please note that voicemails left after 4:00 p.m. may not be returned until the following business day.  We are closed weekends and major holidays. You have access to a nurse at all times for urgent questions. Please call the main number to the clinic Dept: [phone] and follow the prompts.   For any non-urgent questions, you may also contact your provider using MyChart. We now offer e-Visits for anyone 18 and older to request care online for non-urgent symptoms. For details visit [email or web address].   Also download the MyChart app! Go to the app store, search "MyChart", open the app, select , and log in with your MyChart username and password.  Masks are optional in the cancer centers. If you would like for your care team to wear a mask while they are taking care of you, please let them know. You may have one support person who is at least 48 years old accompany you for your appointments. 

## 2022-04-04 ENCOUNTER — Other Ambulatory Visit: Payer: Self-pay | Admitting: Hematology and Oncology

## 2022-04-04 ENCOUNTER — Telehealth: Payer: Self-pay | Admitting: *Deleted

## 2022-04-04 ENCOUNTER — Inpatient Hospital Stay

## 2022-04-04 ENCOUNTER — Other Ambulatory Visit: Payer: Self-pay

## 2022-04-04 ENCOUNTER — Ambulatory Visit: Admitting: Hematology and Oncology

## 2022-04-04 VITALS — BP 116/73 | HR 74 | Temp 98.7°F | Resp 16 | Wt 191.2 lb

## 2022-04-04 DIAGNOSIS — C8581 Other specified types of non-Hodgkin lymphoma, lymph nodes of head, face, and neck: Secondary | ICD-10-CM

## 2022-04-04 DIAGNOSIS — Z5112 Encounter for antineoplastic immunotherapy: Secondary | ICD-10-CM | POA: Diagnosis not present

## 2022-04-04 MED ORDER — SODIUM CHLORIDE 0.9 % IV SOLN
375.0000 mg/m2 | Freq: Once | INTRAVENOUS | Status: AC
  Administered 2022-04-04: 700 mg via INTRAVENOUS
  Filled 2022-04-04: qty 50

## 2022-04-04 MED ORDER — DIPHENHYDRAMINE HCL 25 MG PO CAPS
25.0000 mg | ORAL_CAPSULE | Freq: Once | ORAL | Status: AC
  Administered 2022-04-04: 25 mg via ORAL
  Filled 2022-04-04: qty 1

## 2022-04-04 MED ORDER — ACETAMINOPHEN 325 MG PO TABS
ORAL_TABLET | ORAL | Status: AC
  Filled 2022-04-04: qty 1

## 2022-04-04 MED ORDER — CETIRIZINE HCL 10 MG/ML IV SOLN
10.0000 mg | Freq: Once | INTRAVENOUS | Status: AC
  Administered 2022-04-04: 10 mg via INTRAVENOUS
  Filled 2022-04-04: qty 1

## 2022-04-04 MED ORDER — ACETAMINOPHEN 325 MG PO TABS
650.0000 mg | ORAL_TABLET | Freq: Once | ORAL | Status: AC
  Administered 2022-04-04: 650 mg via ORAL
  Filled 2022-04-04: qty 2

## 2022-04-04 MED ORDER — ACETAMINOPHEN 325 MG PO TABS
ORAL_TABLET | ORAL | Status: AC
  Filled 2022-04-04: qty 2

## 2022-04-04 MED ORDER — FLUCONAZOLE 100 MG PO TABS: 100.0000 mg | ORAL_TABLET | Freq: Every day | ORAL | 0 refills | Status: DC

## 2022-04-04 MED ORDER — SODIUM CHLORIDE 0.9 % IV SOLN: Freq: Once | INTRAVENOUS | Status: AC

## 2022-04-04 NOTE — Patient Instructions (Signed)
[facility]  Discharge Instructions: Thank you for choosing [facility] to provide your oncology and hematology care.   If you have a lab appointment with the [facility], please go directly to the [facility] and check in at the registration area.   Wear comfortable clothing and clothing appropriate for easy access to any Portacath or PICC line.   We strive to give you quality time with your provider. You may need to reschedule your appointment if you arrive late (15 or more minutes).  Arriving late affects you and other patients whose appointments are after yours.  Also, if you miss three or more appointments without notifying the office, you may be dismissed from the clinic at the provider's discretion.      For prescription refill requests, have your pharmacy contact our office and allow 72 hours for refills to be completed.    Today you received the following chemotherapy and/or immunotherapy agent: Rituximab   To help prevent nausea and vomiting after your treatment, we encourage you to take your nausea medication as directed.  BELOW ARE SYMPTOMS THAT SHOULD BE REPORTED IMMEDIATELY: *FEVER GREATER THAN 100.4 F (38 C) OR HIGHER *CHILLS OR SWEATING *NAUSEA AND VOMITING THAT IS NOT CONTROLLED WITH YOUR NAUSEA MEDICATION *UNUSUAL SHORTNESS OF BREATH *UNUSUAL BRUISING OR BLEEDING *URINARY PROBLEMS (pain or burning when urinating, or frequent urination) *BOWEL PROBLEMS (unusual diarrhea, constipation, pain near the anus) TENDERNESS IN MOUTH AND THROAT WITH OR WITHOUT PRESENCE OF ULCERS (sore throat, sores in mouth, or a toothache) UNUSUAL RASH, SWELLING OR PAIN  UNUSUAL VAGINAL DISCHARGE OR ITCHING   Items with * indicate a potential emergency and should be followed up as soon as possible or go to the Emergency Department if any problems should occur.  Please show the CHEMOTHERAPY ALERT CARD or IMMUNOTHERAPY ALERT CARD at check-in to the  Emergency Department and triage nurse.  Should you have questions after your visit or need to cancel or reschedule your appointment, please contact [facility]  Dept: [phone]  and follow the prompts.  Office hours are 8:00 a.m. to 4:30 p.m. Monday - Friday. Please note that voicemails left after 4:00 p.m. may not be returned until the following business day.  We are closed weekends and major holidays. You have access to a nurse at all times for urgent questions. Please call the main number to the clinic Dept: [phone] and follow the prompts.   For any non-urgent questions, you may also contact your provider using MyChart. We now offer e-Visits for anyone 95 and older to request care online for non-urgent symptoms. For details visit [email or web address].   Also download the MyChart app! Go to the app store, search "MyChart", open the app, select [facility], and log in with your MyChart username and password.  Masks are optional in the cancer centers. If you would like for your care team to wear a mask while they are taking care of you, please let them know. You may have one support person who is at least 48 years old accompany you for your appointments. Rituximab Injection What is this medication? RITUXIMAB (ri TUX i mab) treats leukemia and lymphoma. It works by blocking a protein that causes cancer cells to grow and multiply. This helps to slow or stop the spread of cancer cells. It may also be used to treat autoimmune conditions, such as arthritis. It works by slowing down an overactive immune system. It is a monoclonal  antibody. This medicine may be used for other purposes; ask your health care provider or pharmacist if you have questions. COMMON BRAND NAME(S): RIABNI, Rituxan, RUXIENCE, truxima What should I tell my care team before I take this medication? They need to know if you have any of these conditions: Chest pain Heart disease Immune system  problems Infection, such as chickenpox, cold sores, hepatitis B, herpes Irregular heartbeat or rhythm Kidney disease Low blood counts, such as low white cells, platelets, red cells Lung disease Recent or upcoming vaccine An unusual or allergic reaction to rituximab, other medications, foods, dyes, or preservatives Pregnant or trying to get pregnant Breast-feeding How should I use this medication? This medication is injected into a vein. It is given by a care team in a hospital or clinic setting. A special MedGuide will be given to you before each treatment. Be sure to read this information carefully each time. Talk to your care team about the use of this medication in children. While this medication may be prescribed for children as young as 6 months for selected conditions, precautions do apply. Overdosage: If you think you have taken too much of this medicine contact a poison control center or emergency room at once. NOTE: This medicine is only for you. Do not share this medicine with others. What if I miss a dose? Keep appointments for follow-up doses. It is important not to miss your dose. Call your care team if you are unable to keep an appointment. What may interact with this medication? Do not take this medication with any of the following: Live vaccines This medication may also interact with the following: Cisplatin This list may not describe all possible interactions. Give your health care provider a list of all the medicines, herbs, non-prescription drugs, or dietary supplements you use. Also tell them if you smoke, drink alcohol, or use illegal drugs. Some items may interact with your medicine. What should I watch for while using this medication? Your condition will be monitored carefully while you are receiving this medication. You may need blood work while taking this medication. This medication can cause serious infusion reactions. To reduce the risk your care team may give  you other medications to take before receiving this one. Be sure to follow the directions from your care team. This medication may increase your risk of getting an infection. Call your care team for advice if you get a fever, chills, sore throat, or other symptoms of a cold or flu. Do not treat yourself. Try to avoid being around people who are sick. Call your care team if you are around anyone with measles, chickenpox, or if you develop sores or blisters that do not heal properly. Avoid taking medications that contain aspirin, acetaminophen, ibuprofen, naproxen, or ketoprofen unless instructed by your care team. These medications may hide a fever. This medication may cause serious skin reactions. They can happen weeks to months after starting the medication. Contact your care team right away if you notice fevers or flu-like symptoms with a rash. The rash may be red or purple and then turn into blisters or peeling of the skin. You may also notice a red rash with swelling of the face, lips, or lymph nodes in your neck or under your arms. In some patients, this medication may cause a serious brain infection that may cause death. If you have any problems seeing, thinking, speaking, walking, or standing, tell your care team right away. If you cannot reach your care team, urgently seek  another source of medical care. Talk to your care team if you may be pregnant. Serious birth defects can occur if you take this medication during pregnancy and for 12 months after the last dose. You will need a negative pregnancy test before starting this medication. Contraception is recommended while taking this medication and for 12 months after the last dose. Your care team can help you find the option that works for you. Do not breastfeed while taking this medication and for at least 6 months after the last dose. What side effects may I notice from receiving this medication? Side effects that you should report to your care  team as soon as possible: Allergic reactions or angioedema--skin rash, itching or hives, swelling of the face, eyes, lips, tongue, arms, or legs, trouble swallowing or breathing Bowel blockage--stomach cramping, unable to have a bowel movement or pass gas, loss of appetite, vomiting Dizziness, loss of balance or coordination, confusion or trouble speaking Heart attack--pain or tightness in the chest, shoulders, arms, or jaw, nausea, shortness of breath, cold or clammy skin, feeling faint or lightheaded Heart rhythm changes--fast or irregular heartbeat, dizziness, feeling faint or lightheaded, chest pain, trouble breathing Infection--fever, chills, cough, sore throat, wounds that don't heal, pain or trouble when passing urine, general feeling of discomfort or being unwell Infusion reactions--chest pain, shortness of breath or trouble breathing, feeling faint or lightheaded Kidney injury--decrease in the amount of urine, swelling of the ankles, hands, or feet Liver injury--right upper belly pain, loss of appetite, nausea, light-colored stool, dark yellow or brown urine, yellowing skin or eyes, unusual weakness or fatigue Redness, blistering, peeling, or loosening of the skin, including inside the mouth Stomach pain that is severe, does not go away, or gets worse Tumor lysis syndrome (TLS)--nausea, vomiting, diarrhea, decrease in the amount of urine, dark urine, unusual weakness or fatigue, confusion, muscle pain or cramps, fast or irregular heartbeat, joint pain Side effects that usually do not require medical attention (report to your care team if they continue or are bothersome): Headache Joint pain Nausea Runny or stuffy nose Unusual weakness or fatigue This list may not describe all possible side effects. Call your doctor for medical advice about side effects. You may report side effects to FDA at 1-800-FDA-1088. Where should I keep my medication? This medication is given in a hospital or  clinic. It will not be stored at home. NOTE: This sheet is a summary. It may not cover all possible information. If you have questions about this medicine, talk to your doctor, pharmacist, or health care provider.  2023 Elsevier/Gold Standard (2021-09-14 00:00:00)

## 2022-04-04 NOTE — Telephone Encounter (Signed)
Patient called. Currently in infusion. Finished 1 week Rx for diflucan given 11/6. Continues to have vaginal itching and small amount white discharge. Requested refill of diflucan.  Dr. Alvy Bimler informed  Contacted patient with Dr. Alvy Bimler response: RX Refilled. Please let her know if it does not resolve she needs to see GYN. Patient verbalized understanding.

## 2022-04-12 ENCOUNTER — Other Ambulatory Visit: Payer: Federal, State, Local not specified - PPO

## 2022-04-12 ENCOUNTER — Ambulatory Visit: Payer: Federal, State, Local not specified - PPO | Admitting: Hematology and Oncology

## 2022-04-18 ENCOUNTER — Ambulatory Visit (INDEPENDENT_AMBULATORY_CARE_PROVIDER_SITE_OTHER): Admitting: Endocrinology

## 2022-04-18 ENCOUNTER — Encounter: Payer: Self-pay | Admitting: Endocrinology

## 2022-04-18 VITALS — BP 122/84 | HR 116 | Ht 64.0 in | Wt 191.8 lb

## 2022-04-18 DIAGNOSIS — E669 Obesity, unspecified: Secondary | ICD-10-CM | POA: Diagnosis not present

## 2022-04-18 DIAGNOSIS — Z6832 Body mass index (BMI) 32.0-32.9, adult: Secondary | ICD-10-CM | POA: Diagnosis not present

## 2022-04-18 DIAGNOSIS — E063 Autoimmune thyroiditis: Secondary | ICD-10-CM | POA: Diagnosis not present

## 2022-04-18 LAB — T4, FREE: Free T4: 0.7 ng/dL (ref 0.60–1.60)

## 2022-04-18 LAB — TSH: TSH: 1.25 u[IU]/mL (ref 0.35–5.50)

## 2022-04-18 NOTE — Progress Notes (Unsigned)
Patient ID: Sydney Vaughn, female   DOB: 06/20/1975, 46 y.o.   MRN: [id number]             Reason for Appointment: Endocrinology follow-up    History of Present Illness:   WEIGHT management:  Previous history: She thinks her weight had been in the 170s a couple of years ago and has gained weight which she cannot shed Patient said that she has seen a dietitian in 2017 and is trying to modify her diet with taking salads for lunch, avoiding fried food usually She is trying to avoid snacking, trying to bake  foods as well as usually trying to avoid high-fat meats and avoiding sweets as well.  However she is usually trying to prepare food for her family with 4 children.   Her weight was about the same in July 2017  Since she had expressed difficulty with weight loss on her own with diet and exercise she was started on phentermine and Topamax on her initial visit in 07/2016   On her initial follow-up she had lost 18 pounds and had no side effects with the 2 drugs  In 2019 her Topamax was stopped because of her feeling that it was causing memory difficulties Also having periodic rapid heartbeat with phentermine low doses She was tried on Contrave and she had nausea and dizziness with the first tablet  RECENT history:  Previously in 2/21 she was having a rash with itching and Topamax was stopped by her PCP  Previously had no side effects with phentermine which she had been taking again since 9/22 However she says that after her last visit she felt jittery with taking phentermine and stopped it She tries to control her portions with her diet Does not feel any palpitations and does have a smart watch to monitor her pulse  Her weight is about the same as last year although she had lost a little weight earlier this year She has not exercised as much lately but is planning to join a gym She is asking for a weight loss medication like Ozempic She thinks she had significant lethargy with  Contrave  Wt Readings from Last 3 Encounters:  04/18/22 191 lb 12.8 oz (87 kg)  04/04/22 191 lb 4 oz (86.8 kg)  03/28/22 191 lb 4 oz (86.8 kg)    Hypothyroidism was first diagnosed in 11/2015  At the time of diagnosis patient was having symptoms of fatigue, weight gain and hair loss. She apparently has been having symptoms for several years the same way She has not had problems with cold intolerance except sometimes her feet get cold Baseline TSH was 7.7 and she was started on levothyroxine 25 g daily  On her initial consultation she was having symptoms of fatigue, having difficulty losing weight, fatigue on exercising and some hair loss although not as much, this is despite her taking the levothyroxine Also her thyroid functions including free T3 were normal  Recent history:  She has had less fatigue with relief of her stress situation  Currently taking 88 mcg levothyroxine, dose was reduced slightly when TSH was low normal in 9/22 She has been regular with taking her thyroid supplement soon after waking up   Thyroid function results have been as follows:  Lab Results  Component Value Date   TSH 1.19 12/13/2021   TSH 3.591 04/06/2021   TSH 0.35 02/02/2021   FREET4 0.76 12/13/2021   FREET4 0.85 02/02/2021   FREET4 0.79 08/21/2019  Past Medical History:  Diagnosis Date   Adult acne    ANA positive    Anemia    iron infusions   Asthma    Chronic sinusitis    CT and MRI.    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   Fibromyalgia    Foot fracture, left 08/2012   History of appendicitis 09/2006   History of blood transfusion    last time 02/2018 hysterectomy surgery   Hypothyroidism    Intramural uterine fibroid 2019   Iron deficiency    Menorrhagia    Metatarsal fracture 10/2012   Right Proximal fifth metatarsal fracture   Obesity    Panic attack    at bedtime, awakes from sleep   Pelvic pain    resolved   Polyarthralgia    was seeing  rheumatology   PONV (postoperative nausea and vomiting)     Past Surgical History:  Procedure Laterality Date   ABDOMINAL HYSTERECTOMY  04/2018   APPENDECTOMY     BUNIONECTOMY Left    DILATION AND EVACUATION     PAROTIDECTOMY Right 03/04/2020   Procedure: right superficial PAROTIDECTOMY, with nerve preservation;  Surgeon: Melida Quitter, MD;  Location: [facility] OR;  Service: ENT;  Laterality: Right;   TUBAL LIGATION      Family History  Problem Relation Age of Onset   Thyroid disease Mother    Sinusitis Sister    Sinusitis Brother    Lupus Daughter        treated at [facility]   Allergies Daughter    Colon cancer Neg Hx    Colon polyps Neg Hx    Esophageal cancer Neg Hx    Rectal cancer Neg Hx    Stomach cancer Neg Hx     Social History:  reports that she has never smoked. She has never used smokeless tobacco. She reports that she does not currently use alcohol. She reports that she does not use drugs.  Allergies:   Allergies  Allergen Reactions   Contrave [Naltrexone-Bupropion Hcl Er] Nausea Only    Nausea and dizziness   Oxycodone-Acetaminophen Er     Swollen face, eyes and caused itching    Rituximab-Abbs [Rituximab] Nausea Only and Other (See Comments)    Dizziness, "felt like something in throat", headache    Allergies as of 04/18/2022       Reactions   Contrave [naltrexone-bupropion Hcl Er] Nausea Only   Nausea and dizziness   Oxycodone-acetaminophen Er    Swollen face, eyes and caused itching    Rituximab-abbs [rituximab] Nausea Only, Other (See Comments)   Dizziness, "felt like something in throat", headache        Medication List        Accurate as of April 18, 2022  2:04 PM. If you have any questions, ask your nurse or doctor.          albuterol 108 (90 Base) MCG/ACT inhaler Commonly known as: VENTOLIN HFA Inhale 1-2 puffs into the lungs every 4 (four) hours as needed for wheezing or shortness of breath.   fluconazole 100 MG tablet Commonly  known as: DIFLUCAN Take 1 tablet (100 mg total) by mouth daily.   levothyroxine 88 MCG tablet Commonly known as: SYNTHROID TAKE 1 TABLET BY MOUTH EVERY DAY BEFORE BREAKFAST   prochlorperazine 10 MG tablet Commonly known as: COMPAZINE Take 1 tablet (10 mg total) by mouth every 6 (six) hours as needed for nausea or vomiting.   sertraline 25 MG  tablet Commonly known as: ZOLOFT Take 25 mg by mouth daily.          Review of Systems      She had been taking venlafaxine for depression and anxiety long-term Also tried on Celexa but she has stopped this   No history of diabetes  Has borderline LDL levels  Lab Results  Component Value Date   HGBA1C 5.3 02/02/2021   HGBA1C 5.6 12/02/2015   HGBA1C 5.1 12/25/2012   Lab Results  Component Value Date   MICROALBUR 0.8 02/03/2016   LDLCALC 104 (H) 02/02/2021   CREATININE 0.89 03/03/2022          Examination:    BP 122/84   Pulse (!) 116   Ht '5\' 4"'$  (1.626 m)   Wt 191 lb 12.8 oz (87 kg)   LMP 04/14/2018   SpO2 96%   BMI 32.92 kg/m     Assessment:  OBESITY:  Her highest BMI has been about 36 at baseline  She is only on low-dose phentermine and weight has leveled off, previously was gaining weight She also has had more issues with stress, some depression and insomnia  She thinks she can do somewhat better with exercise and will continue work on her diet  Follow-up in 6 months   HYPOTHYROIDISM, mild with highest TSH 7.7  She has fatigue but this is likely unrelated Her TSH is back to normal with 88 mcg levothyroxine   PLAN:  As above She will continue 88 mcg levothyroxine every morning   For her weight loss program she will look at the cost of Wegovy and 0.5 mg prescription sent Showed her the injection device and how this would be adjusted, discussed possible side effects of nausea She thinks that she can do the injection herself To start with 0.25 mg for 4 weeks and then 0.5 and continue to titrate every  month, maximum dose 2 mg Patient brochure given and she will check on the coverage and also get a co-pay card online  Follow-up in 4 months  Kamauri Denardo Dwyane Dee 04/18/2022, 2:04 PM     Note: This office note was prepared with Dragon voice recognition system technology. Any transcriptional errors that result from this process are unintentional.

## 2022-04-19 ENCOUNTER — Other Ambulatory Visit: Payer: Self-pay

## 2022-05-24 ENCOUNTER — Other Ambulatory Visit: Payer: Self-pay

## 2022-05-24 ENCOUNTER — Emergency Department (HOSPITAL_BASED_OUTPATIENT_CLINIC_OR_DEPARTMENT_OTHER): Admitting: Radiology

## 2022-05-24 ENCOUNTER — Encounter (HOSPITAL_BASED_OUTPATIENT_CLINIC_OR_DEPARTMENT_OTHER): Payer: Self-pay | Admitting: Emergency Medicine

## 2022-05-24 DIAGNOSIS — M546 Pain in thoracic spine: Secondary | ICD-10-CM | POA: Insufficient documentation

## 2022-05-24 DIAGNOSIS — E039 Hypothyroidism, unspecified: Secondary | ICD-10-CM | POA: Diagnosis not present

## 2022-05-24 DIAGNOSIS — R0789 Other chest pain: Secondary | ICD-10-CM | POA: Insufficient documentation

## 2022-05-24 DIAGNOSIS — Z79899 Other long term (current) drug therapy: Secondary | ICD-10-CM | POA: Insufficient documentation

## 2022-05-24 DIAGNOSIS — J45909 Unspecified asthma, uncomplicated: Secondary | ICD-10-CM | POA: Diagnosis not present

## 2022-05-24 DIAGNOSIS — Z7951 Long term (current) use of inhaled steroids: Secondary | ICD-10-CM | POA: Insufficient documentation

## 2022-05-24 LAB — CBC
HCT: 40.4 % (ref 36.0–46.0)
Hemoglobin: 14.1 g/dL (ref 12.0–15.0)
MCH: 31.5 pg (ref 26.0–34.0)
MCHC: 34.9 g/dL (ref 30.0–36.0)
MCV: 90.4 fL (ref 80.0–100.0)
Platelets: 305 10*3/uL (ref 150–400)
RBC: 4.47 MIL/uL (ref 3.87–5.11)
RDW: 12.8 % (ref 11.5–15.5)
WBC: 6.9 10*3/uL (ref 4.0–10.5)
nRBC: 0 % (ref 0.0–0.2)

## 2022-05-24 NOTE — ED Triage Notes (Signed)
Pt c/o generalized chest pain and upper back pain that started 2 days ago.

## 2022-05-25 ENCOUNTER — Emergency Department (HOSPITAL_BASED_OUTPATIENT_CLINIC_OR_DEPARTMENT_OTHER)
Admission: EM | Admit: 2022-05-25 | Discharge: 2022-05-25 | Disposition: A | Discharge status: Home / Self Care | Attending: Emergency Medicine | Admitting: Emergency Medicine

## 2022-05-25 DIAGNOSIS — R0789 Other chest pain: Secondary | ICD-10-CM

## 2022-05-25 LAB — TROPONIN I (HIGH SENSITIVITY)
Troponin I (High Sensitivity): <2 ng/L (ref <18)
Troponin I (High Sensitivity): <2 ng/L (ref <18)

## 2022-05-25 LAB — BASIC METABOLIC PANEL
Anion gap: 11 (ref 5–15)
BUN: 13 mg/dL (ref 6–20)
CO2: 22 mmol/L (ref 22–32)
Calcium: 9.4 mg/dL (ref 8.9–10.3)
Chloride: 106 mmol/L (ref 98–111)
Creatinine, Ser: 0.78 mg/dL (ref 0.44–1.00)
GFR, Estimated: >60 mL/min (ref >60)
Glucose, Bld: 98 mg/dL (ref 70–99)
Potassium: 3.9 mmol/L (ref 3.5–5.1)
Sodium: 139 mmol/L (ref 135–145)

## 2022-05-25 LAB — D-DIMER, QUANTITATIVE: D-Dimer, Quant: 0.3 ug/mL-FEU (ref 0.00–0.50)

## 2022-05-25 MED ORDER — KETOROLAC TROMETHAMINE 15 MG/ML IJ SOLN
15.0000 mg | Freq: Once | INTRAMUSCULAR | Status: AC
  Administered 2022-05-25: 15 mg via INTRAVENOUS
  Filled 2022-05-25: qty 1

## 2022-05-25 NOTE — Discharge Instructions (Signed)
You may use over-the-counter Motrin (Ibuprofen), Acetaminophen (Tylenol), topical muscle creams such as SalonPas, Icy Hot, Bengay, etc. Please stretch, apply ice or heat (whichever helps), and have massage therapy for additional assistance.  

## 2022-05-25 NOTE — ED Provider Notes (Signed)
[facility] EMERGENCY DEPT Provider Note  CSN: [id number] Arrival date & time: 05/24/22 2324  Chief Complaint(s) Chest Pain  HPI Sydney Vaughn is a 47 y.o. female with a past medical history listed below including fibromyalgia, hypothyroidism, lymphoma currently taking immunosuppressive medications here for 2 days of constant upper back and bilateral anterior chest discomfort described as achiness.  Worse with movements, and certain positions.  No recent fevers or infections.  Patient does have a dry cough ongoing for a while.  No shortness of breath.  No nausea or vomiting.  No abdominal pain.  No other physical complaints.  The history is provided by the patient.    Past Medical History Past Medical History:  Diagnosis Date   Adult acne    ANA positive    Anemia    iron infusions   Asthma    Chronic sinusitis    CT and MRI.    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   Fibromyalgia    Foot fracture, left 08/2012   History of appendicitis 09/2006   History of blood transfusion    last time 02/2018 hysterectomy surgery   Hypothyroidism    Intramural uterine fibroid 2019   Iron deficiency    Menorrhagia    Metatarsal fracture 10/2012   Right Proximal fifth metatarsal fracture   Obesity    Panic attack    at bedtime, awakes from sleep   Pelvic pain    resolved   Polyarthralgia    was seeing rheumatology   PONV (postoperative nausea and vomiting)    Patient Active Problem List   Diagnosis Date Noted   Yeast infection involving the vagina and surrounding area 03/21/2022   Marginal zone lymphoma of lymph nodes of neck ([place]) 04/01/2020   Neck stiffness 04/01/2020   Neuropathic pain 04/01/2020   Cerumen impaction 03/19/2020   Parotid neoplasm 03/04/2020   Anxiety disorder due to medical condition 04/20/2016   Vitamin D deficiency 12/14/2015   Acquired hypothyroidism 12/14/2015   Weight gain 12/03/2015   Positive ANA (antinuclear antibody)  03/18/2014   BMI 32.0-32.9,adult 12/11/2013   Right foot pain 11/04/2012   Allergic rhinitis 08/15/2011   Home Medication(s) Prior to Admission medications   Medication Sig Start Date End Date Taking? Authorizing Provider  albuterol (PROVENTIL HFA;VENTOLIN HFA) 108 (90 Base) MCG/ACT inhaler Inhale 1-2 puffs into the lungs every 4 (four) hours as needed for wheezing or shortness of breath. 08/29/18   Maryruth Hancock, MD  levothyroxine (SYNTHROID) 88 MCG tablet TAKE 1 TABLET BY MOUTH EVERY DAY BEFORE BREAKFAST 03/10/22   Elayne Snare, MD  prochlorperazine (COMPAZINE) 10 MG tablet Take 1 tablet (10 mg total) by mouth every 6 (six) hours as needed for nausea or vomiting. 03/17/22   Heath Lark, MD  sertraline (ZOLOFT) 25 MG tablet Take 25 mg by mouth daily. 03/07/22   [provider]  Allergies Contrave [naltrexone-bupropion hcl er], Oxycodone-acetaminophen er, and Rituximab-abbs [rituximab]  Review of Systems Review of Systems As noted in HPI  Physical Exam Vital Signs  I have reviewed the triage vital signs BP 134/86   Pulse 69   Temp 98.4 F (36.9 C)   Resp 18   Ht '5\' 3"'$  (1.6 m)   Wt 85.3 kg   LMP 04/14/2018   SpO2 97%   BMI 33.30 kg/m   Physical Exam Vitals reviewed.  Constitutional:      General: She is not in acute distress.    Appearance: She is well-developed. She is not diaphoretic.  HENT:     Head: Normocephalic and atraumatic.     Nose: Nose normal.  Eyes:     General: No scleral icterus.       Right eye: No discharge.        Left eye: No discharge.     Conjunctiva/sclera: Conjunctivae normal.     Pupils: Pupils are equal, round, and reactive to light.  Cardiovascular:     Rate and Rhythm: Normal rate and regular rhythm.     Heart sounds: No murmur heard.    No friction rub. No gallop.  Pulmonary:     Effort: Pulmonary  effort is normal. No respiratory distress.     Breath sounds: Normal breath sounds. No stridor. No rales.  Chest:     Chest wall: Tenderness present.    Abdominal:     General: There is no distension.     Palpations: Abdomen is soft.     Tenderness: There is no abdominal tenderness.  Musculoskeletal:     Cervical back: Normal range of motion and neck supple. Tenderness present.     Thoracic back: Tenderness present.       Back:  Skin:    General: Skin is warm and dry.     Findings: No erythema or rash.  Neurological:     Mental Status: She is alert and oriented to person, place, and time.     ED Results and Treatments Labs (all labs ordered are listed, but only abnormal results are displayed) Labs Reviewed  BASIC METABOLIC PANEL  CBC  D-DIMER, QUANTITATIVE  TROPONIN I (HIGH SENSITIVITY)  TROPONIN I (HIGH SENSITIVITY)                                                                                                                         EKG  EKG Interpretation  Date/Time:  Tuesday May 24 2022 23:36:39 EST Ventricular Rate:  77 PR Interval:  172 QRS Duration: 82 QT Interval:  390 QTC Calculation: 441 R Axis:   57 Text Interpretation: Normal sinus rhythm Normal ECG When compared with ECG of 30-Jun-2010 03:06, No significant change was found Confirmed by Addison Lank [phone]) on 05/25/2022 1:42:28 AM       Radiology DG Chest 2 View  Result Date: 05/24/2022 CLINICAL DATA:  Chest pain EXAM: CHEST - 2 VIEW COMPARISON:  06/30/2010 FINDINGS: The  heart size and mediastinal contours are within normal limits. Both lungs are clear. The visualized skeletal structures are unremarkable. IMPRESSION: No active cardiopulmonary disease. Electronically Signed   By: Fidela Salisbury M.D.   On: 05/24/2022 23:56    Medications Ordered in ED Medications  ketorolac (TORADOL) 15 MG/ML injection 15 mg (15 mg Intravenous Given 05/25/22 0303)                                                                                                                                      Procedures Procedures  (including critical care time)  Medical Decision Making / ED Course   Medical Decision Making Amount and/or Complexity of Data Reviewed Labs: ordered. Decision-making details documented in ED Course. Radiology: ordered and independent interpretation performed. Decision-making details documented in ED Course. ECG/medicine tests: ordered and independent interpretation performed. Decision-making details documented in ED Course.  Risk Prescription drug management.    Atypical chest pain. Favored to be MSK.  Differential also includes ACS (though feel this is less likely), pneumonia, pneumothorax, PE, GERD.  Presentation is not classic pleuritic dissection or esophageal perforation.  EKG without acute ischemic changes or evidence of pericarditis. Serial troponins negative x 2  CBC without leukocytosis or anemia. Metabolic panel without significant electrolyte derangements or renal insufficiency Dimer negative Chest x-ray without evidence of pneumonia, pneumothorax, pulmonary edema or pleural effusions.      Final Clinical Impression(s) / ED Diagnoses Final diagnoses:  Chest wall pain   The patient appears reasonably screened and/or stabilized for discharge and I doubt any other medical condition or other [facility] requiring further screening, evaluation, or treatment in the ED at this time. I have discussed the findings, Dx and Tx plan with the patient/family who expressed understanding and agree(s) with the plan. Discharge instructions discussed at length. The patient/family was given strict return precautions who verbalized understanding of the instructions. No further questions at time of discharge.  Disposition: Discharge  Condition: Good  ED Discharge Orders     None       Follow Up: Medicine, [place]  [id number] [phone]  Call  to schedule an appointment for close follow up           This chart was dictated using voice recognition software.  Despite best efforts to proofread,  errors can occur which can change the documentation meaning.    Fatima Blank, MD 05/25/22 [phone]

## 2022-06-06 ENCOUNTER — Encounter (HOSPITAL_COMMUNITY)
Admission: RE | Admit: 2022-06-06 | Discharge: 2022-06-06 | Disposition: A | Discharge status: Home / Self Care | Source: Ambulatory Visit | Attending: Hematology and Oncology | Admitting: Hematology and Oncology

## 2022-06-06 DIAGNOSIS — C8581 Other specified types of non-Hodgkin lymphoma, lymph nodes of head, face, and neck: Secondary | ICD-10-CM | POA: Diagnosis not present

## 2022-06-06 LAB — GLUCOSE, CAPILLARY: Glucose-Capillary: 93 mg/dL (ref 70–99)

## 2022-06-06 MED ORDER — FLUDEOXYGLUCOSE F - 18 (FDG) INJECTION
9.9000 | Freq: Once | INTRAVENOUS | Status: AC
  Administered 2022-06-06: 9.38 via INTRAVENOUS

## 2022-06-07 ENCOUNTER — Inpatient Hospital Stay: Attending: Hematology and Oncology

## 2022-06-07 DIAGNOSIS — Z79899 Other long term (current) drug therapy: Secondary | ICD-10-CM | POA: Insufficient documentation

## 2022-06-07 DIAGNOSIS — Z9221 Personal history of antineoplastic chemotherapy: Secondary | ICD-10-CM | POA: Insufficient documentation

## 2022-06-07 DIAGNOSIS — K209 Esophagitis, unspecified without bleeding: Secondary | ICD-10-CM | POA: Insufficient documentation

## 2022-06-07 DIAGNOSIS — C884 Extranodal marginal zone B-cell lymphoma of mucosa-associated lymphoid tissue [MALT-lymphoma]: Secondary | ICD-10-CM | POA: Insufficient documentation

## 2022-06-09 ENCOUNTER — Encounter: Payer: Self-pay | Admitting: Hematology and Oncology

## 2022-06-09 ENCOUNTER — Other Ambulatory Visit: Payer: Self-pay

## 2022-06-09 ENCOUNTER — Inpatient Hospital Stay (HOSPITAL_BASED_OUTPATIENT_CLINIC_OR_DEPARTMENT_OTHER): Admitting: Hematology and Oncology

## 2022-06-09 VITALS — BP 127/89 | HR 81 | Temp 98.8°F | Resp 18 | Ht 63.0 in | Wt 195.6 lb

## 2022-06-09 DIAGNOSIS — C8581 Other specified types of non-Hodgkin lymphoma, lymph nodes of head, face, and neck: Secondary | ICD-10-CM

## 2022-06-09 DIAGNOSIS — K209 Esophagitis, unspecified without bleeding: Secondary | ICD-10-CM

## 2022-06-09 DIAGNOSIS — Z9221 Personal history of antineoplastic chemotherapy: Secondary | ICD-10-CM | POA: Diagnosis not present

## 2022-06-09 DIAGNOSIS — Z79899 Other long term (current) drug therapy: Secondary | ICD-10-CM | POA: Diagnosis not present

## 2022-06-09 DIAGNOSIS — C884 Extranodal marginal zone B-cell lymphoma of mucosa-associated lymphoid tissue [MALT-lymphoma]: Secondary | ICD-10-CM | POA: Diagnosis present

## 2022-06-09 MED ORDER — LEVOTHYROXINE SODIUM 100 MCG PO TABS: 100.0000 ug | ORAL_TABLET | Freq: Every day | ORAL | Status: DC

## 2022-06-09 MED ORDER — PANTOPRAZOLE SODIUM 40 MG PO TBEC: 40.0000 mg | DELAYED_RELEASE_TABLET | Freq: Every day | ORAL | 1 refills | Status: DC

## 2022-06-09 NOTE — Progress Notes (Signed)
[facility] OFFICE PROGRESS NOTE  Patient Care Team: Medicine, [place] [facility] as PCP - General (Family Medicine) Jarome Matin, MD as Consulting Physician (Dermatology) Horald Pollen, MD as Referring Physician (Internal Medicine)  ASSESSMENT & PLAN:  Marginal zone lymphoma of lymph nodes of neck ([place]) I have reviewed multiple imaging studies with patient and family She has complete response to therapy Her atypical chest pain is unrelated I will see her again in a few months for further follow-up  Esophagitis She has been to the emergency department with atypical chest pain with negative cardiac evaluation PET/CT imaging revealed potential esophagitis as a cause of her atypical chest pain I recommend 6 weeks course of pantoprazole If her symptoms do not improve, I would recommend GI evaluation We discussed lifestyle changes and dietary modification to promote esophageal healing such as avoidance of NSAID, aspirin, caffeinated beverages and spicy food  No orders of the defined types were placed in this encounter.   All questions were answered. The patient knows to call the clinic with any problems, questions or concerns. The total time spent in the appointment was 30 minutes encounter with patients including review of chart and various tests results, discussions about plan of care and coordination of care plan   Heath Lark, MD 06/09/2022 3:50 PM  INTERVAL HISTORY: Please see below for problem oriented charting. she returns for review test results She has been to the emergency department lately due to atypical chest pain She has taken calcium carbonate intermittently for heartburn She has no further pain in the head and neck region We spent majority of our time reviewing test results  REVIEW OF SYSTEMS:   Constitutional: Denies fevers, chills or abnormal weight loss Eyes: Denies blurriness of vision Ears, nose, mouth, throat, and face: Denies  mucositis or sore throat Respiratory: Denies cough, dyspnea or wheezes Skin: Denies abnormal skin rashes Lymphatics: Denies new lymphadenopathy or easy bruising Neurological:Denies numbness, tingling or new weaknesses Behavioral/Psych: Mood is stable, no new changes  All other systems were reviewed with the patient and are negative.  I have reviewed the past medical history, past surgical history, social history and family history with the patient and they are unchanged from previous note.  ALLERGIES:  is allergic to contrave [naltrexone-bupropion hcl er], oxycodone-acetaminophen er, and rituximab-abbs [rituximab].  MEDICATIONS:  Current Outpatient Medications  Medication Sig Dispense Refill   ALPRAZolam (XANAX) 0.25 MG tablet Take by mouth.     pantoprazole (PROTONIX) 40 MG tablet Take 1 tablet (40 mg total) by mouth daily. 42 tablet 1   sertraline (ZOLOFT) 50 MG tablet Take by mouth.     zolpidem (AMBIEN) 10 MG tablet Take by mouth.     albuterol (PROVENTIL HFA;VENTOLIN HFA) 108 (90 Base) MCG/ACT inhaler Inhale 1-2 puffs into the lungs every 4 (four) hours as needed for wheezing or shortness of breath. 1 Inhaler 5   levothyroxine (SYNTHROID) 100 MCG tablet Take 1 tablet (100 mcg total) by mouth daily before breakfast.     prochlorperazine (COMPAZINE) 10 MG tablet Take 1 tablet (10 mg total) by mouth every 6 (six) hours as needed for nausea or vomiting. 30 tablet 0   No current facility-administered medications for this visit.    SUMMARY OF ONCOLOGIC HISTORY: Oncology History  Marginal zone lymphoma of lymph nodes of neck ([place])  02/07/2020 Imaging   Right parotid and adjacent subcutaneous mass without diagnostic feature. Lymphoma, sarcoid, or IgG 4 related disease are considered given the infiltrative appearance. If  a primary parotid neoplasm the extent of the mass would be concerning for aggressive disease. The lesion extends to the subcutaneous preauricular space and should be readily  amenable to biopsy.   03/04/2020 Surgery   Procedure: Right superficial parotidectomy with dissection of facial nerve Surgeon: Redmond Baseman Anesth: General and local with 1% lidocaine with 1:100,000 epinephrine Findings: Right superior parotid with superficial mass dissected easily from nerve branches.   03/04/2020 Pathology Results   FINAL MICROSCOPIC DIAGNOSIS:   A. PAROTID, RIGHT, PAROTIDECTOMY:  - Atypical lymphoid population, see comment.  - Benign salivary gland tissue.   COMMENT:   There is effacement of the salivary gland tissue by a monotonous population of small lymphoid cells with moderate clear cytoplasm. There are lymphoepithelial type lesions. Immunohistochemistry reveals the atypical lymphoid cells are positive for CD20 and bcl-2. There are residual disrupted germinal centers (CD23, bcl-6, CD10-positive; bcl-2 negative). CD3, CD43, and CD5 reveal admixed T-cells. CyclinD1 is negative. Ki-67 is overall low and elevated in residual germinal centers. CD138 reveals only scattered plasma cells which appear polytypic by light chain in situ hybridization. 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 While the morphology is suspicious, there is no aberrant phenotype or monotypic plasma cell population. Thus, PCR for B-cell gene rearrangement will be ordered to assess clonality (results to be reported in an addendum).  ADDENDUM:   PCR for B-cell gene rearrangement is POSITIVE.   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    04/06/2020 PET scan   1. Low level FDG uptake in the right parotid bed compatible with the history of recent surgery. 2. No evidence for hypermetabolic metastatic disease in the neck, chest, abdomen, or  pelvis. 3. Diffuse hypermetabolism in the thyroid parenchyma, likely inflammatory.   04/06/2020 Cancer Staging   Staging form: Hodgkin and Non-Hodgkin Lymphoma, AJCC 8th Edition - Clinical stage from 04/06/2020: Stage I (Marginal zone lymphoma) - Signed by Heath Lark, MD on 04/06/2020   02/17/2022 Imaging   1. Irregular infiltrative appearing area of soft tissue thickening and contrast enhancement involving the right parotid gland and adjacent preauricular subcutaneous and skin. This extends inferiorly approximately to the level of the mandibular angle and superiorly beyond the field of view. Findings are more pronounced than expected for postsurgical changes. Findings are concerning for recurrent lymphoma. No lymphadenopathy identified. 2. Enlarged uniformly enhancing thyroid gland, similar to prior CT   03/03/2022 PET scan   1. Focal area of increased metabolic activity in the RIGHT pre-auricular region corresponding to the area of concern on CT imaging. Findings remain concerning for recurrent lymphoma. Showing greater FDG uptake than pretreatment images and if documented as recurrence would be compatible with Deauville criteria 5 disease. 2. Marked enlargement of the thyroid gland with increased metabolic activity. Findings may relate to thyroiditis. Correlate with thyroid function. Metabolic activity while diffuse on the previous study has increased in the interval. In the absence of known or biochemical evidence of current active thyroiditis would also consider thyroid ultrasound and biopsy as warranted given that there is likely recurrence in the neck. 3. No additional sites of disease identified. 4. Normal spleen and normal liver. 5. Small non FDG avid lymph nodes at the thoracic inlet are unchanged dating back to 2021.     03/16/2022 - 04/04/2022 Chemotherapy   Patient is on  Treatment Plan : ITP Rituximab q7d x 4 cycles     06/07/2022 PET scan   1. No evidence of residual metabolically  active lymphoma (Deauville 1). 2. Low level activity within the distal esophagus, likely esophagitis.     PHYSICAL EXAMINATION: ECOG PERFORMANCE STATUS: 1 - Symptomatic but completely ambulatory  Vitals:   06/09/22 0930  BP: 127/89  Pulse: 81  Resp: 18  Temp: 98.8 F (37.1 C)  SpO2: 98%   Filed Weights   06/09/22 0930  Weight: 195 lb 9.6 oz (88.7 kg)    GENERAL:alert, no distress and comfortable NEURO: alert & oriented x 3 with fluent speech, no focal motor/sensory deficits  LABORATORY DATA:  I have reviewed the data as listed    Component Value Date/Time   NA 139 05/24/2022 2334   NA 139 07/09/2016 1052   K 3.9 05/24/2022 2334   CL 106 05/24/2022 2334   CO2 22 05/24/2022 2334   GLUCOSE 98 05/24/2022 2334   BUN 13 05/24/2022 2334   BUN 7 07/09/2016 1052   CREATININE 0.78 05/24/2022 2334   CREATININE 0.89 03/03/2022 1510   CREATININE 0.95 04/20/2016 1632   CALCIUM 9.4 05/24/2022 2334   PROT 7.5 03/03/2022 1510   PROT 6.7 07/09/2016 1052   ALBUMIN 4.3 03/03/2022 1510   ALBUMIN 4.1 07/09/2016 1052   AST 34 03/03/2022 1510   ALT 28 03/03/2022 1510   ALKPHOS 79 03/03/2022 1510   BILITOT 0.7 03/03/2022 1510   GFRNONAA >60 05/24/2022 2334   GFRNONAA >60 03/03/2022 1510   GFRNONAA 74 04/20/2016 1632   GFRAA 121 07/09/2016 1052   GFRAA 85 04/20/2016 1632    No results found for: "SPEP", "UPEP"  Lab Results  Component Value Date   WBC 6.9 05/24/2022   NEUTROABS 4.6 03/03/2022   HGB 14.1 05/24/2022   HCT 40.4 05/24/2022   MCV 90.4 05/24/2022   PLT 305 05/24/2022      Chemistry      Component Value Date/Time   NA 139 05/24/2022 2334   NA 139 07/09/2016 1052   K 3.9 05/24/2022 2334   CL 106 05/24/2022 2334   CO2 22 05/24/2022 2334   BUN 13 05/24/2022 2334   BUN 7 07/09/2016 1052   CREATININE 0.78 05/24/2022 2334   CREATININE 0.89 03/03/2022 1510   CREATININE 0.95 04/20/2016 1632      Component Value Date/Time   CALCIUM 9.4 05/24/2022 2334    ALKPHOS 79 03/03/2022 1510   AST 34 03/03/2022 1510   ALT 28 03/03/2022 1510   BILITOT 0.7 03/03/2022 1510       RADIOGRAPHIC STUDIES: I have reviewed multiple imaging studies with the patient and family I have personally reviewed the radiological images as listed and agreed with the findings in the report. NM PET Image Restage (PS) Skull Base to Thigh (F-18 FDG)  Result Date: 06/06/2022 CLINICAL DATA:  Subsequent treatment strategy for marginal zone lymphoma. Chemotherapy completed. EXAM: NUCLEAR MEDICINE PET SKULL BASE TO THIGH TECHNIQUE: 9.38 mCi F-18 FDG was injected intravenously. Full-ring PET imaging was performed from the skull base to thigh after the radiotracer. CT data was obtained and used for attenuation correction and anatomic localization. Fasting blood glucose: 93 mg/dl COMPARISON:  PET-CT 03/02/2022 and 04/06/2020. FINDINGS: Mediastinal blood pool activity: SUV max 1.1 Liver activity: SUV max 1.9 NECK: No hypermetabolic cervical lymph nodes are identified.Fairly symmetric activity within the lymphoid tissue of Waldeyer's ring is within physiologic limits.No suspicious activity identified within the pharyngeal mucosal space. The  right periauricular activity is normal. Previously demonstrated homogeneous thyroid activity has decreased in intensity and remains non focal. Incidental CT findings: none CHEST: There are no hypermetabolic mediastinal, hilar or axillary lymph nodes. No hypermetabolic pulmonary activity or suspicious nodularity. Incidental CT findings: Low level activity at the distal esophagus (SUV max 2.9), likely esophagitis. The lungs appear stable with scattered ground-glass densities in the right lung, unchanged from 2021. ABDOMEN/PELVIS: There is no hypermetabolic activity within the liver, adrenal glands, spleen or pancreas. There is no hypermetabolic nodal activity in the abdomen or pelvis. Incidental CT findings: Previous hysterectomy. SKELETON: There is no  hypermetabolic activity to suggest osseous metastatic disease. Incidental CT findings: none IMPRESSION: 1. No evidence of residual metabolically active lymphoma (Deauville 1). 2. Low level activity within the distal esophagus, likely esophagitis. Electronically Signed   By: Richardean Sale M.D.   On: 06/06/2022 12:59   DG Chest 2 View  Result Date: 05/24/2022 CLINICAL DATA:  Chest pain EXAM: CHEST - 2 VIEW COMPARISON:  06/30/2010 FINDINGS: The heart size and mediastinal contours are within normal limits. Both lungs are clear. The visualized skeletal structures are unremarkable. IMPRESSION: No active cardiopulmonary disease. Electronically Signed   By: Fidela Salisbury M.D.   On: 05/24/2022 23:56

## 2022-06-09 NOTE — Assessment & Plan Note (Signed)
She has been to the emergency department with atypical chest pain with negative cardiac evaluation PET/CT imaging revealed potential esophagitis as a cause of her atypical chest pain I recommend 6 weeks course of pantoprazole If her symptoms do not improve, I would recommend GI evaluation We discussed lifestyle changes and dietary modification to promote esophageal healing such as avoidance of NSAID, aspirin, caffeinated beverages and spicy food

## 2022-06-09 NOTE — Assessment & Plan Note (Signed)
I have reviewed multiple imaging studies with patient and family She has complete response to therapy Her atypical chest pain is unrelated I will see her again in a few months for further follow-up

## 2022-06-27 ENCOUNTER — Encounter: Payer: Self-pay | Admitting: Hematology and Oncology

## 2022-06-28 ENCOUNTER — Telehealth: Payer: Self-pay

## 2022-06-28 NOTE — Telephone Encounter (Signed)
Called and scheduled virtual visit on 2/16 at 1 pm. She is aware of appt time/date.

## 2022-07-01 ENCOUNTER — Encounter: Payer: Self-pay | Admitting: Hematology and Oncology

## 2022-07-01 ENCOUNTER — Inpatient Hospital Stay: Attending: Hematology and Oncology | Admitting: Hematology and Oncology

## 2022-07-01 DIAGNOSIS — K209 Esophagitis, unspecified without bleeding: Secondary | ICD-10-CM | POA: Diagnosis not present

## 2022-07-01 DIAGNOSIS — C8581 Other specified types of non-Hodgkin lymphoma, lymph nodes of head, face, and neck: Secondary | ICD-10-CM

## 2022-07-01 NOTE — Assessment & Plan Note (Signed)
I reviewed recent PET/CT findings again Her symptom of esophagitis is not related to cancer We discussed the role of EGD to evaluate

## 2022-07-01 NOTE — Progress Notes (Signed)
HEMATOLOGY-ONCOLOGY ELECTRONIC VISIT PROGRESS NOTE  Patient Care Team: Medicine, [place] [facility] as PCP - General (Family Medicine) Jarome Matin, MD as Consulting Physician (Dermatology) Horald Pollen, MD as Referring Physician (Internal Medicine)  I connected with the patient via telephone conference and verified that I am speaking with the correct person using two identifiers. The patient's location is at home and I am providing care from the [facility] I discussed the limitations, risks, security and privacy concerns of performing an evaluation and management service by e-visits and the availability of in person appointments.  I also discussed with the patient that there may be a patient responsible charge related to this service. The patient expressed understanding and agreed to proceed.   ASSESSMENT & PLAN:  Marginal zone lymphoma of lymph nodes of neck ([place]) I reviewed recent PET/CT findings again Her symptom of esophagitis is not related to cancer We discussed the role of EGD to evaluate  Esophagitis Despite being on proton pump inhibitor and dietary modification, she continues to experience atypical chest discomfort and heartburn I recommend reaching out to her gastroenterologist for EGD evaluation and she is in agreement  No orders of the defined types were placed in this encounter.   INTERVAL HISTORY: Please see below for problem oriented charting. The purpose of today's discussion is to discuss about her symptoms The patient expressed significant anxiety related to her symptoms of esophagitis Despite being on proton pump inhibitor and dietary modification, she is still quite symptomatic and is worried that her symptoms are related to cancer recurrence  SUMMARY OF ONCOLOGIC HISTORY: Oncology History  Marginal zone lymphoma of lymph nodes of neck ([place])  02/07/2020 Imaging   Right parotid and adjacent subcutaneous mass without diagnostic feature.  Lymphoma, sarcoid, or IgG 4 related disease are considered given the infiltrative appearance. If a primary parotid neoplasm the extent of the mass would be concerning for aggressive disease. The lesion extends to the subcutaneous preauricular space and should be readily amenable to biopsy.   03/04/2020 Surgery   Procedure: Right superficial parotidectomy with dissection of facial nerve Surgeon: Redmond Baseman Anesth: General and local with 1% lidocaine with 1:100,000 epinephrine Findings: Right superior parotid with superficial mass dissected easily from nerve branches.   03/04/2020 Pathology Results   FINAL MICROSCOPIC DIAGNOSIS:   A. PAROTID, RIGHT, PAROTIDECTOMY:  - Atypical lymphoid population, see comment.  - Benign salivary gland tissue.   COMMENT:   There is effacement of the salivary gland tissue by a monotonous population of small lymphoid cells with moderate clear cytoplasm. There are lymphoepithelial type lesions. Immunohistochemistry reveals the atypical lymphoid cells are positive for CD20 and bcl-2. There are residual disrupted germinal centers (CD23, bcl-6, CD10-positive; bcl-2 negative). CD3, CD43, and CD5 reveal admixed T-cells. CyclinD1 is negative. Ki-67 is overall low and elevated in residual germinal centers. CD138 reveals only scattered plasma cells which appear polytypic by light chain in situ hybridization. 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 While the morphology is suspicious, there is no aberrant phenotype or monotypic plasma cell population. Thus, PCR for B-cell gene rearrangement will be ordered to assess clonality (results to be reported in an addendum).  ADDENDUM:   PCR for B-cell gene rearrangement is POSITIVE.   This finding supports a diagnosis of non-Hodgkin B-cell lymphoma (extranodal marginal zone lymphoma of mucosa  associated lymphoid tissue - MALT lymphoma), as described above.  04/06/2020 PET scan   1. Low level FDG uptake in the right parotid bed compatible with the history of recent surgery. 2. No evidence for hypermetabolic metastatic disease in the neck, chest, abdomen, or pelvis. 3. Diffuse hypermetabolism in the thyroid parenchyma, likely inflammatory.   04/06/2020 Cancer Staging   Staging form: Hodgkin and Non-Hodgkin Lymphoma, AJCC 8th Edition - Clinical stage from 04/06/2020: Stage I (Marginal zone lymphoma) - Signed by Heath Lark, MD on 04/06/2020   02/17/2022 Imaging   1. Irregular infiltrative appearing area of soft tissue thickening and contrast enhancement involving the right parotid gland and adjacent preauricular subcutaneous and skin. This extends inferiorly approximately to the level of the mandibular angle and superiorly beyond the field of view. Findings are more pronounced than expected for postsurgical changes. Findings are concerning for recurrent lymphoma. No lymphadenopathy identified. 2. Enlarged uniformly enhancing thyroid gland, similar to prior CT   03/03/2022 PET scan   1. Focal area of increased metabolic activity in the RIGHT pre-auricular region corresponding to the area of concern on CT imaging. Findings remain concerning for recurrent lymphoma. Showing greater FDG uptake than pretreatment images and if documented as recurrence would be compatible with Deauville criteria 5 disease. 2. Marked enlargement of the thyroid gland with increased metabolic activity. Findings may relate to thyroiditis. Correlate with thyroid function. Metabolic activity while diffuse on the previous study has increased in the interval. In the absence of known or biochemical evidence of current active thyroiditis would also consider thyroid ultrasound and biopsy as warranted given that there is likely recurrence in the neck. 3. No additional sites of disease identified. 4. Normal spleen and normal  liver. 5. Small non FDG avid lymph nodes at the thoracic inlet are unchanged dating back to 2021.     03/16/2022 - 04/04/2022 Chemotherapy   Patient is on Treatment Plan : ITP Rituximab q7d x 4 cycles     06/07/2022 PET scan   1. No evidence of residual metabolically active lymphoma (Deauville 1). 2. Low level activity within the distal esophagus, likely esophagitis.     REVIEW OF SYSTEMS:   Constitutional: Denies fevers, chills or abnormal weight loss Eyes: Denies blurriness of vision Ears, nose, mouth, throat, and face: Denies mucositis or sore throat Respiratory: Denies cough, dyspnea or wheezes Cardiovascular: Denies palpitation, chest discomfort Skin: Denies abnormal skin rashes Lymphatics: Denies new lymphadenopathy or easy bruising Neurological:Denies numbness, tingling or new weaknesses Behavioral/Psych: Mood is stable, no new changes  Extremities: No lower extremity edema All other systems were reviewed with the patient and are negative.  I have reviewed the past medical history, past surgical history, social history and family history with the patient and they are unchanged from previous note.  ALLERGIES:  is allergic to contrave [naltrexone-bupropion hcl er], oxycodone-acetaminophen er, and rituximab-abbs [rituximab].  MEDICATIONS:  Current Outpatient Medications  Medication Sig Dispense Refill   albuterol (PROVENTIL HFA;VENTOLIN HFA) 108 (90 Base) MCG/ACT inhaler Inhale 1-2 puffs into the lungs every 4 (four) hours as needed for wheezing or shortness of breath. 1 Inhaler 5   ALPRAZolam (XANAX) 0.25 MG tablet Take by mouth.     levothyroxine (SYNTHROID) 100 MCG tablet Take 1 tablet (100 mcg total) by mouth daily before breakfast.     pantoprazole (PROTONIX) 40 MG tablet Take 1 tablet (40 mg total) by mouth daily. 42 tablet 1   prochlorperazine (COMPAZINE) 10 MG tablet Take 1 tablet (10 mg total) by mouth every 6 (six) hours as needed for nausea or vomiting. [place]  tablet 0    sertraline (ZOLOFT) 50 MG tablet Take by mouth.     zolpidem (AMBIEN) 10 MG tablet Take by mouth.     No current facility-administered medications for this visit.    PHYSICAL EXAMINATION: ECOG PERFORMANCE STATUS: 1 - Symptomatic but completely ambulatory  LABORATORY DATA:  I have reviewed the data as listed    Latest Ref Rng & Units 05/24/2022   11:34 PM 03/03/2022    3:10 PM 04/06/2021    8:15 AM  CMP  Glucose 70 - 99 mg/dL 98  86  104   BUN 6 - 20 mg/dL 13  16  11   $ Creatinine 0.44 - 1.00 mg/dL 0.78  0.89  0.86   Sodium 135 - 145 mmol/L 139  138  142   Potassium 3.5 - 5.1 mmol/L 3.9  3.9  4.5   Chloride 98 - 111 mmol/L 106  106  108   CO2 22 - 32 mmol/L 22  25  25   $ Calcium 8.9 - 10.3 mg/dL 9.4  8.8  8.8   Total Protein 6.5 - 8.1 g/dL  7.5  7.7   Total Bilirubin 0.3 - 1.2 mg/dL  0.7  0.4   Alkaline Phos 38 - 126 U/L  79  94   AST 15 - 41 U/L  34  16   ALT 0 - 44 U/L  28  14     Lab Results  Component Value Date   WBC 6.9 05/24/2022   HGB 14.1 05/24/2022   HCT 40.4 05/24/2022   MCV 90.4 05/24/2022   PLT 305 05/24/2022   NEUTROABS 4.6 03/03/2022     RADIOGRAPHIC STUDIES: I have personally reviewed the radiological images as listed and agreed with the findings in the report. NM PET Image Restage (PS) Skull Base to Thigh (F-18 FDG)  Result Date: 06/06/2022 CLINICAL DATA:  Subsequent treatment strategy for marginal zone lymphoma. Chemotherapy completed. EXAM: NUCLEAR MEDICINE PET SKULL BASE TO THIGH TECHNIQUE: 9.38 mCi F-18 FDG was injected intravenously. Full-ring PET imaging was performed from the skull base to thigh after the radiotracer. CT data was obtained and used for attenuation correction and anatomic localization. Fasting blood glucose: 93 mg/dl COMPARISON:  PET-CT 03/02/2022 and 04/06/2020. FINDINGS: Mediastinal blood pool activity: SUV max 1.1 Liver activity: SUV max 1.9 NECK: No hypermetabolic cervical lymph nodes are identified.Fairly symmetric activity  within the lymphoid tissue of Waldeyer's ring is within physiologic limits.No suspicious activity identified within the pharyngeal mucosal space. The right periauricular activity is normal. Previously demonstrated homogeneous thyroid activity has decreased in intensity and remains non focal. Incidental CT findings: none CHEST: There are no hypermetabolic mediastinal, hilar or axillary lymph nodes. No hypermetabolic pulmonary activity or suspicious nodularity. Incidental CT findings: Low level activity at the distal esophagus (SUV max 2.9), likely esophagitis. The lungs appear stable with scattered ground-glass densities in the right lung, unchanged from 2021. ABDOMEN/PELVIS: There is no hypermetabolic activity within the liver, adrenal glands, spleen or pancreas. There is no hypermetabolic nodal activity in the abdomen or pelvis. Incidental CT findings: Previous hysterectomy. SKELETON: There is no hypermetabolic activity to suggest osseous metastatic disease. Incidental CT findings: none IMPRESSION: 1. No evidence of residual metabolically active lymphoma (Deauville 1). 2. Low level activity within the distal esophagus, likely esophagitis. Electronically Signed   By: Richardean Sale M.D.   On: 06/06/2022 12:59    I discussed the assessment and treatment plan with the patient. The patient was provided an opportunity to ask questions  and all were answered. The patient agreed with the plan and demonstrated an understanding of the instructions. The patient was advised to call back or seek an in-person evaluation if the symptoms worsen or if the condition fails to improve as anticipated.    I spent 20 minutes for the appointment reviewing test results, discuss management and coordination of care.  Heath Lark, MD 07/01/2022 3:13 PM

## 2022-07-01 NOTE — Assessment & Plan Note (Signed)
Despite being on proton pump inhibitor and dietary modification, she continues to experience atypical chest discomfort and heartburn I recommend reaching out to her gastroenterologist for EGD evaluation and she is in agreement

## 2022-07-04 ENCOUNTER — Telehealth: Payer: Self-pay

## 2022-07-04 NOTE — Telephone Encounter (Signed)
The pt has been advised and appt made for EGD in the [facility] on 08/16/22 at 10 am.  She will increase PPI to BID.  She will call if she has any further concerns.  Information has been sent to My Chart and mail.

## 2022-07-04 NOTE — Telephone Encounter (Signed)
-----   Message from Irving Copas., MD sent at 07/03/2022  5:17 AM EST ----- Regarding: RE: mutual patient NG, Thanks for reaching out. Happy to get her set up for EGD +/- clinic visit.  Naaman Curro, Please let the patient know that I would recommend increasing her PPI up to twice daily for now and we recommend an upper endoscopy for progressive GERD symptoms through PPI.  She can be set up my next available EGD but increase her PPI to twice daily.  After 1 to 2 weeks if she still having progressive symptoms on twice daily PPI, we can add Carafate while awaiting the EGD.  Should she want to be seen in clinic, she can be set up for a clinic visit with myself or one of the APP's but would try to increase her PPI at a minimum. Thanks. GM ----- Message ----- From: Heath Lark, MD Sent: 07/01/2022   3:15 PM EST To: Irving Copas., MD Subject: mutual patient                                 Hi,  I treated this patient for lymphoma recently She has a lot of GERD/esophagitis despite PPI Would you be able to arrange for EGD? She had colonoscopy eval with you last year  Thanks, Ni

## 2022-07-07 ENCOUNTER — Other Ambulatory Visit: Payer: Self-pay | Admitting: Hematology and Oncology

## 2022-07-07 MED ORDER — PANTOPRAZOLE SODIUM 40 MG PO TBEC: 40.0000 mg | DELAYED_RELEASE_TABLET | Freq: Two times a day (BID) | ORAL | 1 refills | Status: DC

## 2022-07-21 ENCOUNTER — Telehealth: Payer: Self-pay

## 2022-07-21 DIAGNOSIS — N760 Acute vaginitis: Secondary | ICD-10-CM

## 2022-07-21 MED ORDER — METRONIDAZOLE 500 MG PO TABS: 500.0000 mg | ORAL_TABLET | Freq: Two times a day (BID) | ORAL | 0 refills | Status: DC

## 2022-07-21 NOTE — Telephone Encounter (Signed)
Patient called stating that she is having vaginal irritation, with discharge with odor. Requesting rx for BV.

## 2022-07-25 ENCOUNTER — Telehealth: Payer: Self-pay | Admitting: *Deleted

## 2022-07-25 NOTE — Telephone Encounter (Signed)
TC from pt requesting RX for yeast infection. Pt has no encounter with MD at [facility] in record. Advised unable to provide RX.

## 2022-08-16 ENCOUNTER — Encounter: Payer: Self-pay | Admitting: Gastroenterology

## 2022-08-16 ENCOUNTER — Ambulatory Visit (AMBULATORY_SURGERY_CENTER): Admitting: Gastroenterology

## 2022-08-16 VITALS — BP 129/79 | HR 61 | Temp 97.5°F | Resp 13 | Ht 63.0 in | Wt 186.6 lb

## 2022-08-16 DIAGNOSIS — K2289 Other specified disease of esophagus: Secondary | ICD-10-CM | POA: Diagnosis not present

## 2022-08-16 DIAGNOSIS — K209 Esophagitis, unspecified without bleeding: Secondary | ICD-10-CM

## 2022-08-16 DIAGNOSIS — K317 Polyp of stomach and duodenum: Secondary | ICD-10-CM

## 2022-08-16 DIAGNOSIS — K2100 Gastro-esophageal reflux disease with esophagitis, without bleeding: Secondary | ICD-10-CM

## 2022-08-16 DIAGNOSIS — K219 Gastro-esophageal reflux disease without esophagitis: Secondary | ICD-10-CM

## 2022-08-16 MED ORDER — SODIUM CHLORIDE 0.9 % IV SOLN: 500.0000 mL | Freq: Once | INTRAVENOUS | Status: DC

## 2022-08-16 MED ORDER — SUCRALFATE 1 GM/10ML PO SUSP: 1.0000 g | Freq: Two times a day (BID) | ORAL | 0 refills | Status: DC

## 2022-08-16 NOTE — Op Note (Signed)
[facility] Patient Name: Sydney Vaughn Procedure Date: 08/16/2022 10:18 AM MRN: [id number] Endoscopist: Justice Britain , MD, [id number] Age: 47 Referring MD:  Date of Birth: 14-Jun-1975 Gender: Female Account #: [id number] Procedure:                Upper GI endoscopy Indications:              Dysphagia, Esophageal reflux symptoms that recur                            despite appropriate therapy, Chest pain (non                            cardiac) Medicines:                Monitored Anesthesia Care Procedure:                Pre-Anesthesia Assessment:                           - Prior to the procedure, a History and Physical                            was performed, and patient medications and                            allergies were reviewed. The patient's tolerance of                            previous anesthesia was also reviewed. The risks                            and benefits of the procedure and the sedation                            options and risks were discussed with the patient.                            All questions were answered, and informed consent                            was obtained. Prior Anticoagulants: The patient has                            taken no anticoagulant or antiplatelet agents. ASA                            Grade Assessment: II - A patient with mild systemic                            disease. After reviewing the risks and benefits,                            the patient was deemed in satisfactory condition to  undergo the procedure.                           After obtaining informed consent, the endoscope was                            passed under direct vision. Throughout the                            procedure, the patient's blood pressure, pulse, and                            oxygen saturations were monitored continuously. The                            GIF [id number] was introduced through the                             mouth, and advanced to the second part of duodenum.                            The upper GI endoscopy was accomplished without                            difficulty. The patient tolerated the procedure. Scope In: Scope Out: Findings:                 No gross lesions were noted in the majority of the                            esophagus. Biopsies were taken with a cold forceps                            for histology to rule out EOE/LOE. After the rest                            of the endoscopy was completed, a guidewire was                            placed and the scope was withdrawn. Dilation was                            performed with a Savary dilator with mild                            resistance at 18 mm. The dilation site was examined                            following endoscope reinsertion and showed just                            below the UES a mild mucosal disruption, mild  improvement in luminal narrowing and no perforation.                           LA Grade B (one or more mucosal breaks greater than                            5 mm, not extending between the tops of two mucosal                            folds) esophagitis with no bleeding was found in                            the distal esophagus.                           A widely patent Schatzki ring was found at the                            gastroesophageal junction. Biopsies were taken with                            a cold forceps for histology and disruption                            purposes circumferentially around the ring with                            good effect.                           The Z-line was irregular and was found 33 cm from                            the incisors.                           A 3 cm hiatal hernia was present.                           One 4 mm sessile polyp was found in the gastric                            fundus  (likely fundic gland). The polyp was removed                            with a piecemeal technique using a cold biopsy                            forceps. Resection and retrieval were complete.                           Patchy mild inflammation characterized by erosions  and erythema was found in the entire examined                            stomach. Biopsies were taken with a cold forceps                            for histology and Helicobacter pylori testing.                           No gross lesions were noted in the duodenal bulb,                            in the first portion of the duodenum and in the                            second portion of the duodenum. Complications:            No immediate complications. Estimated Blood Loss:     Estimated blood loss was minimal. Impression:               - No gross lesions in the majority of the                            esophagus. Biopsied. Dilated with mild mucosal                            wrent just below the UES.                           - LA Grade B esophagitis with no bleeding found                            distally.                           - Widely patent Schatzki ring. Disrupted.                           - Z-line irregular, 33 cm from the incisors.                           - 3 cm hiatal hernia.                           - One gastric polyp. Resected and retrieved.                           - Gastritis. Biopsied.                           - No gross lesions in the duodenal bulb, in the                            first portion of the duodenum and in the second  portion of the duodenum. Recommendation:           - The patient will be observed post-procedure,                            until all discharge criteria are met.                           - Discharge patient to home.                           - Patient has a contact number available for                             emergencies. The signs and symptoms of potential                            delayed complications were discussed with the                            patient. Return to normal activities tomorrow.                            Written discharge instructions were provided to the                            patient.                           - Dilation diet as per protocol.                           - Please use Cepacol or Halls Lozenges +/-                            Chloraseptic spray for next 72-96 hours to aid in                            sore thoat should you experience this.                           - Monitor for signs/symptoms of bleeding,                            perforation, and infection. If issues please call                            our number to get further assistance as needed.                           - Recommend patient continue taking PPI twice                            daily. Try to take it 30 minutes before breakfast  and 30 minutes before dinner to have 24-hour                            availability.                           - Recommend initiation of Pepcid 20 mg at bedtime.                           - For next 1 month patient should initiate liquid                            Carafate/slurry Carafate 1 g twice daily.                           - Pending how patient is doing with appropriate PPI                            dosing, could consider one-time PPI switch or                            transition to Voquenza in effort of trying to heal                            GERD/esophagitis.                           - Suspect patient has anatomical barrier issues                            with hiatal hernia likely playing some role with                            persisting symptoms. Would recommend consideration                            of endoscopic TIF/hiatal hernia repair versus                            surgical fundoplication/hiatal  hernia repair in                            future based on how patient is doing with                            uptitrated medications or transition medications.                           - Would suspect that having done a dilation as well                            as having disrupted the Schatzki ring biopsies,  that we will be able to have improvement in                            dysphagia symptoms. With this being said, could                            consider manometry if needed in the future.                           - The findings and recommendations were discussed                            with the patient.                           - The findings and recommendations were discussed                            with the designated responsible adult. Justice Britain, MD 08/16/2022 10:52:28 AM

## 2022-08-16 NOTE — Patient Instructions (Signed)
- Dilation diet as per protocol. - Please use Cepacol or Halls Lozenges +/- Chloraseptic spray for next 72-96 hours to aid in sore thoat should you experience this. - Monitor for signs/symptoms of bleeding, perforation, and infection. If issues please call our number to get further assistance as needed. - Recommend patient continue taking PPI twice daily. Try to take it 30 minutes before breakfast and 30 minutes before dinner to have 24-hour availability. - Recommend initiation of Pepcid 20 mg at bedtime. - For next 1 month patient should initiate liquid Carafate/slurry Carafate 1 g twice daily. - Pending how patient is doing with appropriate PPI dosing, could consider one-time PPI switch or transition to Voquenza in effort of trying to heal GERD/esophagitis.  YOU HAD AN ENDOSCOPIC PROCEDURE TODAY AT [facility]:   Refer to the procedure report that was given to you for any specific questions about what was found during the examination.  If the procedure report does not answer your questions, please call your gastroenterologist to clarify.  If you requested that your care partner not be given the details of your procedure findings, then the procedure report has been included in a sealed envelope for you to review at your convenience later.  YOU SHOULD EXPECT: Some feelings of bloating in the abdomen. Passage of more gas than usual.  Walking can help get rid of the air that was put into your GI tract during the procedure and reduce the bloating. If you had a lower endoscopy (such as a colonoscopy or flexible sigmoidoscopy) you may notice spotting of blood in your stool or on the toilet paper. If you underwent a bowel prep for your procedure, you may not have a normal bowel movement for a few days.  Please Note:  You might notice some irritation and congestion in your nose or some drainage.  This is from the oxygen used during your procedure.  There is no need for concern and it should  clear up in a day or so.  SYMPTOMS TO REPORT IMMEDIATELY:  Following upper endoscopy (EGD)  Vomiting of blood or coffee ground material  New chest pain or pain under the shoulder blades  Painful or persistently difficult swallowing  New shortness of breath  Fever of 100F or higher  Black, tarry-looking stools  For urgent or emergent issues, a gastroenterologist can be reached at any hour by calling [phone]. Do not use MyChart messaging for urgent concerns.    DIET:  We do recommend a small meal at first, but then you may proceed to your regular diet.  Drink plenty of fluids but you should avoid alcoholic beverages for 24 hours.  ACTIVITY:  You should plan to take it easy for the rest of today and you should NOT DRIVE or use heavy machinery until tomorrow (because of the sedation medicines used during the test).    FOLLOW UP: Our staff will call the number listed on your records the next business day following your procedure.  We will call around 7:15- 8:00 am to check on you and address any questions or concerns that you may have regarding the information given to you following your procedure. If we do not reach you, we will leave a message.     If any biopsies were taken you will be contacted by phone or by letter within the next 1-3 weeks.  Please call us at [phone] if you have not heard about the biopsies in 3 weeks.    SIGNATURES/CONFIDENTIALITY:  You and/or your care partner have signed paperwork which will be entered into your electronic medical record.  These signatures attest to the fact that that the information above on your After Visit Summary has been reviewed and is understood.  Full responsibility of the confidentiality of this discharge information lies with you and/or your care-partner.

## 2022-08-16 NOTE — Progress Notes (Signed)
GASTROENTEROLOGY PROCEDURE H&P NOTE   Primary Care Physician: Medicine, [facility]  HPI: Sydney Vaughn is a 47 y.o. female who presents for EGD for evaluation of persisting GERD symptoms on therapy.  Past Medical History:  Diagnosis Date   Adult acne    ANA positive    Anemia    iron infusions   Asthma    Chronic sinusitis    CT and MRI.    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   Fibromyalgia    Foot fracture, left 08/2012   History of appendicitis 09/2006   History of blood transfusion    last time 02/2018 hysterectomy surgery   Hypothyroidism    Intramural uterine fibroid 2019   Iron deficiency    Menorrhagia    Metatarsal fracture 10/2012   Right Proximal fifth metatarsal fracture   Obesity    Panic attack    at bedtime, awakes from sleep   Pelvic pain    resolved   Polyarthralgia    was seeing rheumatology   PONV (postoperative nausea and vomiting)    Past Surgical History:  Procedure Laterality Date   ABDOMINAL HYSTERECTOMY  04/2018   APPENDECTOMY     BUNIONECTOMY Left    DILATION AND EVACUATION     PAROTIDECTOMY Right 03/04/2020   Procedure: right superficial PAROTIDECTOMY, with nerve preservation;  Surgeon: Melida Quitter, MD;  Location: [facility] OR;  Service: ENT;  Laterality: Right;   TUBAL LIGATION     Current Outpatient Medications  Medication Sig Dispense Refill   albuterol (PROVENTIL HFA;VENTOLIN HFA) 108 (90 Base) MCG/ACT inhaler Inhale 1-2 puffs into the lungs every 4 (four) hours as needed for wheezing or shortness of breath. 1 Inhaler 5   ALPRAZolam (XANAX) 0.25 MG tablet Take by mouth.     levothyroxine (SYNTHROID) 100 MCG tablet Take 1 tablet (100 mcg total) by mouth daily before breakfast.     metroNIDAZOLE (FLAGYL) 500 MG tablet Take 1 tablet (500 mg total) by mouth 2 (two) times daily. 14 tablet 0   pantoprazole (PROTONIX) 40 MG tablet Take 1 tablet (40 mg total) by mouth 2 (two) times daily. 60 tablet 1    sertraline (ZOLOFT) 50 MG tablet Take by mouth.     zolpidem (AMBIEN) 10 MG tablet Take by mouth.     No current facility-administered medications for this visit.    Current Outpatient Medications:    albuterol (PROVENTIL HFA;VENTOLIN HFA) 108 (90 Base) MCG/ACT inhaler, Inhale 1-2 puffs into the lungs every 4 (four) hours as needed for wheezing or shortness of breath., Disp: 1 Inhaler, Rfl: 5   ALPRAZolam (XANAX) 0.25 MG tablet, Take by mouth., Disp: , Rfl:    levothyroxine (SYNTHROID) 100 MCG tablet, Take 1 tablet (100 mcg total) by mouth daily before breakfast., Disp: , Rfl:    metroNIDAZOLE (FLAGYL) 500 MG tablet, Take 1 tablet (500 mg total) by mouth 2 (two) times daily., Disp: 14 tablet, Rfl: 0   pantoprazole (PROTONIX) 40 MG tablet, Take 1 tablet (40 mg total) by mouth 2 (two) times daily., Disp: 60 tablet, Rfl: 1   sertraline (ZOLOFT) 50 MG tablet, Take by mouth., Disp: , Rfl:    zolpidem (AMBIEN) 10 MG tablet, Take by mouth., Disp: , Rfl:  Allergies  Allergen Reactions   Contrave [Naltrexone-Bupropion Hcl Er] Nausea Only    Nausea and dizziness   Oxycodone-Acetaminophen Er     Swollen face, eyes and caused itching    Rituximab-Abbs [Rituximab]  Nausea Only and Other (See Comments)    Dizziness, "felt like something in throat", headache   Family History  Problem Relation Age of Onset   Thyroid disease Mother    Sinusitis Sister    Sinusitis Brother    Lupus Daughter        treated at [facility]   Allergies Daughter    Colon cancer Neg Hx    Colon polyps Neg Hx    Esophageal cancer Neg Hx    Rectal cancer Neg Hx    Stomach cancer Neg Hx    Social History   Socioeconomic History   Marital status: Married    Spouse name: Not on file   Number of children: 4   Years of education: Not on file   Highest education level: Not on file  Occupational History    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 Haberl lives in [place] with her husband and children. She has 4 daughters: ages ranging from 94 yo- 67 yo. 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     Social Determinants of Health   Financial Resource Strain: Not on file  Food Insecurity: Not on file  Transportation Needs: Not on file  Physical Activity: Not on file  Stress: Not on file  Social Connections: Not on file  Intimate Partner Violence: Not on file    Physical Exam: There were no vitals filed for this visit. There is no height or weight on file to calculate BMI. GEN: NAD EYE: Sclerae anicteric ENT: MMM CV: Non-tachycardic GI: Soft, NT/ND NEURO:  Alert & Oriented x 3  Lab Results: No results for input(s): "WBC", "HGB", "HCT", "PLT" in the last 72 hours. BMET No results for input(s): "NA", "K", "CL", "CO2", "GLUCOSE", "BUN", "CREATININE", "CALCIUM" in the last 72 hours. LFT No results for input(s): "PROT", "ALBUMIN", "AST", "ALT", "ALKPHOS", "BILITOT", "BILIDIR", "IBILI" in the last 72 hours. PT/INR No results for input(s): "LABPROT", "INR" in the last 72 hours.   Impression / Plan: This is a 47 y.o.female who presents for EGD for evaluation of persisting GERD symptoms on therapy.  The risks and benefits of endoscopic evaluation/treatment were discussed with the patient and/or family; these include but are not limited to the risk of perforation, infection, bleeding, missed lesions, lack of diagnosis, severe illness requiring hospitalization, as well as anesthesia and sedation related illnesses.  The patient's history has been reviewed, patient examined, no  change in status, and deemed stable for procedure.  The patient and/or family is agreeable to proceed.    Justice Britain, MD [facility] # [id number]

## 2022-08-16 NOTE — Progress Notes (Signed)
Called to room to assist during endoscopic procedure.  Patient ID and intended procedure confirmed with present staff. Received instructions for my participation in the procedure from the performing physician.  

## 2022-08-16 NOTE — Progress Notes (Signed)
VS by CW  Pt's states no medical or surgical changes since previsit or office visit.  

## 2022-08-16 NOTE — Progress Notes (Signed)
Pt in recovery with monitors in place, VSS. Report given to receiving RN. Bite guard was placed with pt awake to ensure comfort. No dental or soft tissue damage noted. 

## 2022-08-17 ENCOUNTER — Telehealth: Payer: Self-pay | Admitting: *Deleted

## 2022-08-17 NOTE — Telephone Encounter (Signed)
  Follow up Call-     08/16/2022    9:45 AM 08/17/2021    1:16 PM  Call back number  Post procedure Call Back phone  # [phone]  Permission to leave phone message Yes Yes     Patient questions:  Do you have a fever, pain , or abdominal swelling? No. Pain Score  0 *  Have you tolerated food without any problems? Yes.    Have you been able to return to your normal activities? Yes.    Do you have any questions about your discharge instructions: Diet   No. Medications  No. Follow up visit  No.  Do you have questions or concerns about your Care? No.  Actions: * If pain score is 4 or above: No action needed, pain <4.

## 2022-08-19 ENCOUNTER — Encounter: Payer: Self-pay | Admitting: Gastroenterology

## 2022-08-22 ENCOUNTER — Other Ambulatory Visit (INDEPENDENT_AMBULATORY_CARE_PROVIDER_SITE_OTHER)

## 2022-08-22 DIAGNOSIS — E063 Autoimmune thyroiditis: Secondary | ICD-10-CM

## 2022-08-22 LAB — TSH: TSH: 1.43 u[IU]/mL (ref 0.35–5.50)

## 2022-08-22 LAB — T4, FREE: Free T4: 0.94 ng/dL (ref 0.60–1.60)

## 2022-08-24 ENCOUNTER — Encounter: Payer: Self-pay | Admitting: Endocrinology

## 2022-08-24 NOTE — Progress Notes (Unsigned)
Patient ID: Sydney Vaughn, female   DOB: 05/05/1976, 47 y.o.   MRN: [id number]            I connected with the above-named patient by video enabled telemedicine application and verified that I am speaking with the correct person. The patient was explained the limitations of evaluation and management by telemedicine and the availability of in person appointments.  Patient also understood that there may be a patient responsible charge related to this service  Location of the patient: Patient's home  Location of the provider: Physician office Only the patient and myself were participating in the encounter The patient understood the above statements and agreed to proceed.  Reason for Appointment: Endocrinology follow-up    History of Present Illness:   WEIGHT management:  Previous history: She thinks her weight had been in the 170s a couple of years ago and has gained weight which she cannot shed Patient said that she has seen a dietitian in 2017 and is trying to modify her diet with taking salads for lunch, avoiding fried food usually She is trying to avoid snacking, trying to bake  foods as well as usually trying to avoid high-fat meats and avoiding sweets as well.  However she is usually trying to prepare food for her family with 4 children.   Her weight was about the same in July 2017  Since she had expressed difficulty with weight loss on her own with diet and exercise she was started on phentermine and Topamax on her initial visit in 07/2016   On her initial follow-up she had lost 18 pounds and had no side effects with the 2 drugs  In 2019 her Topamax was stopped because of her feeling that it was causing memory difficulties Also having periodic rapid heartbeat with phentermine low doses She was tried on Contrave and she had nausea and dizziness with the first tablet  RECENT history:  Previously in 2/21 she was having a rash with itching and Topamax was stopped by her PCP  For  quite some time had no side effects with phentermine which she had been taking again since 9/22 However  she felt jittery with taking phentermine and stopped it in late 2023 She feels like she is able to control her portions with her diet Not getting any more steroids with her lymphoma treatment  Currently is trying to do the exercise bike or rowing machine as well as some walking for exercise, able to do this regularly  Her weight is about 4 pounds down from her last visit at 187 Unable to get Wegovy covered through her insurance Currently she wants to continue without medications  Wt Readings from Last 3 Encounters:  08/16/22 186 lb 9.6 oz (84.6 kg)  06/09/22 195 lb 9.6 oz (88.7 kg)  05/24/22 188 lb (85.3 kg)    Hypothyroidism was first diagnosed in 11/2015  At the time of diagnosis patient was having symptoms of fatigue, weight gain and hair loss. She apparently has been having symptoms for several years the same way She has not had problems with cold intolerance except sometimes her feet get cold Baseline TSH was 7.7 and she was started on levothyroxine 25 g daily  On her initial consultation she was having symptoms of fatigue, having difficulty losing weight, fatigue on exercising and some hair loss although not as much, this is despite her taking the levothyroxine Also her thyroid functions including free T3 were normal  She has been regular with taking her  thyroid supplement soon after waking up around 6 AM and does not take her Protonix till about 9 AM, breakfast is generally an hour later  Her PCP increased her dose to 100 mcg levothyroxine in late 2023 possibly because of high normal TSH With this she did not feel any different Subsequent TSH here was normal and she is here for 65-month follow-up No unusual fatigue lately  Thyroid function results have been as follows:  Lab Results  Component Value Date   TSH 1.43 08/22/2022   TSH 1.25 04/18/2022   TSH 1.19 12/13/2021    FREET4 0.94 08/22/2022   FREET4 0.70 04/18/2022   FREET4 0.76 12/13/2021     Because of abnormal PET scan done for her lymphoma showing activity in the thyroid diffusely her PCP did an ultrasound which was unremarkable and antibodies which were positive as expected  Past Medical History:  Diagnosis Date   Adult acne    ANA positive    Anemia    iron infusions   Asthma    Chronic sinusitis    CT and MRI.    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   Fibromyalgia    Foot fracture, left 08/2012   History of appendicitis 09/2006   History of blood transfusion    last time 02/2018 hysterectomy surgery   Hypothyroidism    Intramural uterine fibroid 2019   Iron deficiency    Menorrhagia    Metatarsal fracture 10/2012   Right Proximal fifth metatarsal fracture   Obesity    Panic attack    at bedtime, awakes from sleep   Pelvic pain    resolved   Polyarthralgia    was seeing rheumatology   PONV (postoperative nausea and vomiting)     Past Surgical History:  Procedure Laterality Date   ABDOMINAL HYSTERECTOMY  04/2018   APPENDECTOMY     BUNIONECTOMY Left    DILATION AND EVACUATION     PAROTIDECTOMY Right 03/04/2020   Procedure: right superficial PAROTIDECTOMY, with nerve preservation;  Surgeon: Christia Reading, MD;  Location: [facility] OR;  Service: ENT;  Laterality: Right;   TUBAL LIGATION      Family History  Problem Relation Age of Onset   Thyroid disease Mother    Sinusitis Sister    Sinusitis Brother    Lupus Daughter        treated at [facility]   Allergies Daughter    Colon cancer Neg Hx    Colon polyps Neg Hx    Esophageal cancer Neg Hx    Rectal cancer Neg Hx    Stomach cancer Neg Hx     Social History:  reports that she has never smoked. She has never used smokeless tobacco. She reports that she does not currently use alcohol. She reports that she does not use drugs.  Allergies:   Allergies  Allergen Reactions   Contrave [Naltrexone-Bupropion Hcl  Er] Nausea Only    Nausea and dizziness   Oxycodone-Acetaminophen Er     Swollen face, eyes and caused itching    Rituximab-Abbs [Rituximab] Nausea Only and Other (See Comments)    Dizziness, "felt like something in throat", headache    Allergies as of 08/25/2022       Reactions   Contrave [naltrexone-bupropion Hcl Er] Nausea Only   Nausea and dizziness   Oxycodone-acetaminophen Er    Swollen face, eyes and caused itching    Rituximab-abbs [rituximab] Nausea Only, Other (See Comments)   Dizziness, "felt like  something in throat", headache        Medication List        Accurate as of August 24, 2022  9:00 PM. If you have any questions, ask your nurse or doctor.          albuterol 108 (90 Base) MCG/ACT inhaler Commonly known as: VENTOLIN HFA Inhale 1-2 puffs into the lungs every 4 (four) hours as needed for wheezing or shortness of breath.   levothyroxine 100 MCG tablet Commonly known as: SYNTHROID Take 1 tablet (100 mcg total) by mouth daily before breakfast.   metroNIDAZOLE 500 MG tablet Commonly known as: FLAGYL Take 1 tablet (500 mg total) by mouth 2 (two) times daily.   pantoprazole 40 MG tablet Commonly known as: Protonix Take 1 tablet (40 mg total) by mouth 2 (two) times daily.   sertraline 50 MG tablet Commonly known as: ZOLOFT Take by mouth.   sucralfate 1 GM/10ML suspension Commonly known as: CARAFATE Take 10 mLs (1 g total) by mouth 2 (two) times daily.   zolpidem 10 MG tablet Commonly known as: AMBIEN Take by mouth.          Review of Systems      She had been taking sertraline for depression  No history of diabetes  Has borderline LDL levels  Lab Results  Component Value Date   HGBA1C 5.3 02/02/2021   HGBA1C 5.6 12/02/2015   HGBA1C 5.1 12/25/2012   Lab Results  Component Value Date   MICROALBUR 0.8 02/03/2016   LDLCALC 104 (H) 02/02/2021   CREATININE 0.78 05/24/2022          Examination:    LMP 04/14/2018      Assessment:  OBESITY:  Her highest BMI has been about 36 at baseline  Weight has improved slightly with her continuing her diet and exercise regimen which she wants to continue without any pharmacological treatment   HYPOTHYROIDISM, mild with highest TSH 7.7 No unusual fatigue recently  Currently on 100 mcg of levothyroxine since late 2023 and is regular with this regimen   PLAN:   She will continue taking her levothyroxine 100 mcg and discussed that this should not have any interaction with her Protonix which she takes 3 hours later  Follow-up in August  Reather Littler 08/24/2022, 9:00 PM     Note: This office note was prepared with Insurance underwriter. Any transcriptional errors that result from this process are unintentional.

## 2022-08-25 ENCOUNTER — Encounter: Payer: Self-pay | Admitting: Endocrinology

## 2022-08-25 ENCOUNTER — Telehealth (INDEPENDENT_AMBULATORY_CARE_PROVIDER_SITE_OTHER): Admitting: Endocrinology

## 2022-08-25 VITALS — Ht 63.0 in | Wt 187.0 lb

## 2022-08-25 DIAGNOSIS — E669 Obesity, unspecified: Secondary | ICD-10-CM

## 2022-08-25 DIAGNOSIS — Z6832 Body mass index (BMI) 32.0-32.9, adult: Secondary | ICD-10-CM

## 2022-08-25 DIAGNOSIS — E063 Autoimmune thyroiditis: Secondary | ICD-10-CM

## 2022-09-01 ENCOUNTER — Encounter: Payer: Self-pay | Admitting: Hematology and Oncology

## 2022-09-01 ENCOUNTER — Inpatient Hospital Stay: Attending: Hematology and Oncology

## 2022-09-01 ENCOUNTER — Inpatient Hospital Stay: Admitting: Hematology and Oncology

## 2022-09-11 ENCOUNTER — Other Ambulatory Visit: Payer: Self-pay | Admitting: Hematology and Oncology

## 2022-10-12 ENCOUNTER — Other Ambulatory Visit: Payer: Self-pay | Admitting: Endocrinology

## 2022-10-13 ENCOUNTER — Telehealth: Payer: Self-pay

## 2022-10-13 ENCOUNTER — Emergency Department (HOSPITAL_COMMUNITY)

## 2022-10-13 ENCOUNTER — Emergency Department (HOSPITAL_COMMUNITY)
Admission: EM | Admit: 2022-10-13 | Discharge: 2022-10-13 | Disposition: A | Discharge status: Home / Self Care | Attending: Emergency Medicine | Admitting: Emergency Medicine

## 2022-10-13 ENCOUNTER — Other Ambulatory Visit: Payer: Self-pay

## 2022-10-13 DIAGNOSIS — Z1152 Encounter for screening for COVID-19: Secondary | ICD-10-CM | POA: Diagnosis not present

## 2022-10-13 DIAGNOSIS — Z79890 Hormone replacement therapy (postmenopausal): Secondary | ICD-10-CM | POA: Diagnosis not present

## 2022-10-13 DIAGNOSIS — J069 Acute upper respiratory infection, unspecified: Secondary | ICD-10-CM | POA: Diagnosis not present

## 2022-10-13 DIAGNOSIS — E039 Hypothyroidism, unspecified: Secondary | ICD-10-CM | POA: Diagnosis not present

## 2022-10-13 DIAGNOSIS — R079 Chest pain, unspecified: Secondary | ICD-10-CM

## 2022-10-13 DIAGNOSIS — J45909 Unspecified asthma, uncomplicated: Secondary | ICD-10-CM | POA: Diagnosis not present

## 2022-10-13 DIAGNOSIS — R059 Cough, unspecified: Secondary | ICD-10-CM | POA: Diagnosis present

## 2022-10-13 LAB — CBC
HCT: 42.9 % (ref 36.0–46.0)
Hemoglobin: 14.5 g/dL (ref 12.0–15.0)
MCH: 31.7 pg (ref 26.0–34.0)
MCHC: 33.8 g/dL (ref 30.0–36.0)
MCV: 93.9 fL (ref 80.0–100.0)
Platelets: 293 10*3/uL (ref 150–400)
RBC: 4.57 MIL/uL (ref 3.87–5.11)
RDW: 13.3 % (ref 11.5–15.5)
WBC: 10.3 10*3/uL (ref 4.0–10.5)
nRBC: 0 % (ref 0.0–0.2)

## 2022-10-13 LAB — BASIC METABOLIC PANEL
Anion gap: 11 (ref 5–15)
BUN: 5 mg/dL — ABNORMAL LOW (ref 6–20)
CO2: 24 mmol/L (ref 22–32)
Calcium: 9 mg/dL (ref 8.9–10.3)
Chloride: 104 mmol/L (ref 98–111)
Creatinine, Ser: 1.01 mg/dL — ABNORMAL HIGH (ref 0.44–1.00)
GFR, Estimated: >60 mL/min (ref >60)
Glucose, Bld: 134 mg/dL — ABNORMAL HIGH (ref 70–99)
Potassium: 3.7 mmol/L (ref 3.5–5.1)
Sodium: 139 mmol/L (ref 135–145)

## 2022-10-13 LAB — TROPONIN I (HIGH SENSITIVITY)
Troponin I (High Sensitivity): 3 ng/L (ref <18)
Troponin I (High Sensitivity): 4 ng/L (ref <18)

## 2022-10-13 LAB — SARS CORONAVIRUS 2 BY RT PCR: SARS Coronavirus 2 by RT PCR: NEGATIVE

## 2022-10-13 MED ORDER — ONDANSETRON 4 MG PO TBDP: 4.0000 mg | ORAL_TABLET | Freq: Three times a day (TID) | ORAL | 0 refills | Status: DC | PRN

## 2022-10-13 MED ORDER — ONDANSETRON HCL 4 MG/2ML IJ SOLN
4.0000 mg | Freq: Once | INTRAMUSCULAR | Status: AC
  Administered 2022-10-13: 4 mg via INTRAVENOUS
  Filled 2022-10-13: qty 2

## 2022-10-13 MED ORDER — HYDROCODONE BIT-HOMATROP MBR 5-1.5 MG/5ML PO SOLN: 5.0000 mL | Freq: Four times a day (QID) | ORAL | 0 refills | Status: DC | PRN

## 2022-10-13 MED ORDER — KETOROLAC TROMETHAMINE 15 MG/ML IJ SOLN
15.0000 mg | Freq: Once | INTRAMUSCULAR | Status: AC
  Administered 2022-10-13: 15 mg via INTRAVENOUS
  Filled 2022-10-13: qty 1

## 2022-10-13 MED ORDER — LACTATED RINGERS IV BOLUS
1000.0000 mL | Freq: Once | INTRAVENOUS | Status: AC
  Administered 2022-10-13: 1000 mL via INTRAVENOUS

## 2022-10-13 NOTE — ED Triage Notes (Signed)
Chest pain worse with movement of left arm.

## 2022-10-13 NOTE — Telephone Encounter (Signed)
Request sent to Dr. Lucianne Muss

## 2022-10-13 NOTE — ED Provider Notes (Signed)
[facility] AT [facility] Provider Note   CSN: [id number] Arrival date & time: 10/13/22  1945     History  Chief Complaint  Patient presents with   Chest Pain   Generalized Body Aches    Sydney Vaughn is a 47 y.o. female.  47 year old female presents today for concern of URI symptoms, including generalized myalgias and chest pain.  She has cough which has been ongoing since Monday and is nonproductive.  Has been around family member who has been sick with URI symptoms.  Without prior history of cardiac disease.  Chest pain is reproducible on palpation.  Worse with movement.  Worse with cough.  No prior history of DVT or PE.  No leg pain or swelling.  No recent long travel or recent surgery.  The history is provided by the patient. No language interpreter was used.       Home Medications Prior to Admission medications   Medication Sig Start Date End Date Taking? Authorizing Provider  albuterol (PROVENTIL HFA;VENTOLIN HFA) 108 (90 Base) MCG/ACT inhaler Inhale 1-2 puffs into the lungs every 4 (four) hours as needed for wheezing or shortness of breath. 08/29/18   Wandra Feinstein, MD  levothyroxine (SYNTHROID) 100 MCG tablet Take 1 tablet (100 mcg total) by mouth daily before breakfast. 06/09/22   Artis Delay, MD  pantoprazole (PROTONIX) 40 MG tablet Take 1 tablet (40 mg total) by mouth 2 (two) times daily. 07/07/22   Artis Delay, MD  phentermine 15 MG capsule TAKE 1 CAPSULE BY MOUTH EVERY DAY IN THE MORNING 10/13/22   Reather Littler, MD  sertraline (ZOLOFT) 50 MG tablet Take by mouth. 04/18/22   [provider]  sucralfate (CARAFATE) 1 GM/10ML suspension Take 10 mLs (1 g total) by mouth 2 (two) times daily. 08/16/22   Mansouraty, Netty Starring., MD  zolpidem Remus Loffler) 10 MG tablet Take by mouth. 06/08/22 08/25/22  [provider]      Allergies    Contrave [naltrexone-bupropion hcl er], Oxycodone-acetaminophen er, and Rituximab-abbs [rituximab]     Review of Systems   Review of Systems  Constitutional:  Negative for fever.  Respiratory:  Positive for cough. Negative for shortness of breath.   Cardiovascular:  Positive for chest pain. Negative for palpitations and leg swelling.  Gastrointestinal:  Positive for nausea and vomiting. Negative for abdominal pain.  Neurological:  Negative for light-headedness and headaches.  All other systems reviewed and are negative.   Physical Exam Updated Vital Signs BP 116/84   Pulse (!) 106   Temp (!) 100.4 F (38 C) (Oral)   Resp 18   Ht 5\' 3"  (1.6 m)   Wt 81.2 kg   LMP 04/14/2018   SpO2 100%   BMI 31.71 kg/m  Physical Exam Vitals and nursing note reviewed.  Constitutional:      General: She is not in acute distress.    Appearance: Normal appearance. She is not ill-appearing.  HENT:     Head: Normocephalic and atraumatic.     Nose: Nose normal.  Eyes:     General: No scleral icterus.    Extraocular Movements: Extraocular movements intact.     Conjunctiva/sclera: Conjunctivae normal.  Cardiovascular:     Rate and Rhythm: Normal rate and regular rhythm.     Heart sounds: Normal heart sounds.  Pulmonary:     Effort: Pulmonary effort is normal. No respiratory distress.     Breath sounds: Normal breath sounds. No wheezing or rales.  Abdominal:  General: There is no distension.     Palpations: Abdomen is soft.     Tenderness: There is no abdominal tenderness. There is no guarding.  Musculoskeletal:        General: Normal range of motion.     Cervical back: Normal range of motion.  Skin:    General: Skin is warm and dry.  Neurological:     General: No focal deficit present.     Mental Status: She is alert and oriented to person, place, and time. Mental status is at baseline.     ED Results / Procedures / Treatments   Labs (all labs ordered are listed, but only abnormal results are displayed) Labs Reviewed  BASIC METABOLIC PANEL - Abnormal; Notable for the following  components:      Result Value   Glucose, Bld 134 (*)    BUN 5 (*)    Creatinine, Ser 1.01 (*)    All other components within normal limits  SARS CORONAVIRUS 2 BY RT PCR  CBC  TROPONIN I (HIGH SENSITIVITY)  TROPONIN I (HIGH SENSITIVITY)    EKG EKG Interpretation  Date/Time:  Thursday Oct 13 2022 19:58:20 EDT Ventricular Rate:  128 PR Interval:  152 QRS Duration: 72 QT Interval:  294 QTC Calculation: 429 R Axis:   61 Text Interpretation: Sinus tachycardia Right atrial enlargement Septal infarct , age undetermined Abnormal ECG When compared with ECG of 24-May-2022 23:36, PREVIOUS ECG IS PRESENT when compared to prior, faster rate. No STEMI Confirmed by Theda Belfast ([id number]) on 10/13/2022 8:47:35 PM  Radiology DG Chest 2 View  Result Date: 10/13/2022 CLINICAL DATA:  Chest pain EXAM: CHEST - 2 VIEW COMPARISON:  05/24/2022 FINDINGS: The heart size and mediastinal contours are within normal limits. Both lungs are clear. The visualized skeletal structures are unremarkable. IMPRESSION: Normal study. Electronically Signed   By: Charlett Nose M.D.   On: 10/13/2022 20:23    Procedures Procedures    Medications Ordered in ED Medications  lactated ringers bolus 1,000 mL (1,000 mLs Intravenous New Bag/Given 10/13/22 2145)  ondansetron (ZOFRAN) injection 4 mg (4 mg Intravenous Given 10/13/22 2145)  ketorolac (TORADOL) 15 MG/ML injection 15 mg (15 mg Intravenous Given 10/13/22 2145)    ED Course/ Medical Decision Making/ A&P                             Medical Decision Making Amount and/or Complexity of Data Reviewed Labs: ordered. Radiology: ordered.  Risk Prescription drug management.   Medical Decision Making / ED Course   This patient presents to the ED for concern of URI symptoms, myalgias, chest pain, this involves an extensive number of treatment options, and is a complaint that carries with it a high risk of complications and morbidity.  The differential diagnosis includes  viral URI, ACS, costochondritis, myocarditis, muscle strain, PE  MDM: 47 year old female presents today for concern of URI symptoms including generalized myalgias and chest pain.  She does have a recent sick contact.  Chest pain is reproducible on exam.  No prior history of DVT or PE.  Low risk for PE on Wells criteria.  Workup overall reassuring.  BMP without acute concerns.  Initial troponin negative.  CBC unremarkable.  COVID negative.  Chest x-ray without acute cardiopulmonary process.  EKG without acute ischemic changes.  This is likely from the violent coughing spells.  Likely MSK in etiology.  Will provide Hycodan to help with cough suppression.  Discussed  importance of sinus rinse given she does have congestion and postnasal drip.  Patient is appropriate for discharge.  Discharged in stable condition.  Return precaution discussed.  Patient voices understanding and is in agreement with plan.   Lab Tests: -I ordered, reviewed, and interpreted labs.   The pertinent results include:   Labs Reviewed  BASIC METABOLIC PANEL - Abnormal; Notable for the following components:      Result Value   Glucose, Bld 134 (*)    BUN 5 (*)    Creatinine, Ser 1.01 (*)    All other components within normal limits  SARS CORONAVIRUS 2 BY RT PCR  CBC  TROPONIN I (HIGH SENSITIVITY)  TROPONIN I (HIGH SENSITIVITY)      EKG  EKG Interpretation  Date/Time:  Thursday Oct 13 2022 19:58:20 EDT Ventricular Rate:  128 PR Interval:  152 QRS Duration: 72 QT Interval:  294 QTC Calculation: 429 R Axis:   61 Text Interpretation: Sinus tachycardia Right atrial enlargement Septal infarct , age undetermined Abnormal ECG When compared with ECG of 24-May-2022 23:36, PREVIOUS ECG IS PRESENT when compared to prior, faster rate. No STEMI Confirmed by Theda Belfast ([id number]) on 10/13/2022 8:47:35 PM         Imaging Studies ordered: I ordered imaging studies including cxr I independently visualized and interpreted  imaging. I agree with the radiologist interpretation   Medicines ordered and prescription drug management: Meds ordered this encounter  Medications   lactated ringers bolus 1,000 mL   ondansetron (ZOFRAN) injection 4 mg   ketorolac (TORADOL) 15 MG/ML injection 15 mg    -I have reviewed the patients home medicines and have made adjustments as needed   Reevaluation: After the interventions noted above, I reevaluated the patient and found that they have :improved  Co morbidities that complicate the patient evaluation  Past Medical History:  Diagnosis Date   Adult acne    ANA positive    Anemia    iron infusions   Asthma    Chronic sinusitis    CT and MRI.    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   Fibromyalgia    Foot fracture, left 08/2012   History of appendicitis 09/2006   History of blood transfusion    last time 02/2018 hysterectomy surgery   Hypothyroidism    Intramural uterine fibroid 2019   Iron deficiency    Menorrhagia    Metatarsal fracture 10/2012   Right Proximal fifth metatarsal fracture   Obesity    Panic attack    at bedtime, awakes from sleep   Pelvic pain    resolved   Polyarthralgia    was seeing rheumatology   PONV (postoperative nausea and vomiting)       Dispostion: Patient is appropriate for discharge.  Discharged in stable condition.  Return precaution discussed.  Patient reports understanding and is in agreement with plan.  Final Clinical Impression(s) / ED Diagnoses Final diagnoses:  Viral URI with cough    Rx / DC Orders ED Discharge Orders          Ordered    HYDROcodone bit-homatropine (HYCODAN) 5-1.5 MG/5ML syrup  Every 6 hours PRN        10/13/22 2241    ondansetron (ZOFRAN-ODT) 4 MG disintegrating tablet  Every 8 hours PRN        10/13/22 2241              Marita Kansas, PA-C 10/13/22 2243    Tegeler,  Canary Brim, MD 10/13/22 [phone]

## 2022-10-13 NOTE — Telephone Encounter (Signed)
Refill request for Phentermine.  

## 2022-10-13 NOTE — ED Triage Notes (Signed)
Pt having fatigue, generalized body aches, chest pain that radiates to arm, fever 102, SOB and dry cough for two days.Denies abd pain but endorses N&V.Pt has been around grandson that has been sick but no other sick exposures

## 2022-10-13 NOTE — Discharge Instructions (Addendum)
Your workup today is reassuring.  No concerning cause of your chest pain.  This is likely due to either the significant coughing spells you are having or costochondritis which is triggered at times by an upper respiratory infection.  Treatment for this is anti-inflammatories.  Take ibuprofen 600 mg 3 times a day for about 7 to 10 days.  Have also sent in cough syrup as well as nausea medication.  Take 1000 mg of Tylenol every 8 hours as well to help with the headache and muscle aches.  For any concerning symptoms return to the emergency department.

## 2022-10-17 ENCOUNTER — Other Ambulatory Visit: Payer: Self-pay | Admitting: Endocrinology

## 2022-10-17 NOTE — Telephone Encounter (Signed)
Transmission failed for the one on 10/13/22 but they did have an old one on file that had a refill.

## 2022-10-17 NOTE — Telephone Encounter (Signed)
Do you want to refill the Phentermine.?

## 2022-11-15 ENCOUNTER — Other Ambulatory Visit: Payer: Self-pay | Admitting: Gastroenterology

## 2022-11-15 NOTE — Telephone Encounter (Signed)
Patient needs to contact office to discuss refill

## 2022-12-26 ENCOUNTER — Other Ambulatory Visit

## 2022-12-29 ENCOUNTER — Ambulatory Visit: Admitting: Endocrinology

## 2023-01-11 ENCOUNTER — Other Ambulatory Visit: Payer: Self-pay | Admitting: Endocrinology

## 2023-01-11 ENCOUNTER — Telehealth: Payer: Self-pay | Admitting: Endocrinology

## 2023-01-11 DIAGNOSIS — E039 Hypothyroidism, unspecified: Secondary | ICD-10-CM

## 2023-01-11 NOTE — Telephone Encounter (Signed)
Patient advising that she wanted to know if she need Blood work with appointment

## 2023-01-12 ENCOUNTER — Other Ambulatory Visit (INDEPENDENT_AMBULATORY_CARE_PROVIDER_SITE_OTHER)

## 2023-01-12 DIAGNOSIS — E039 Hypothyroidism, unspecified: Secondary | ICD-10-CM

## 2023-01-13 ENCOUNTER — Ambulatory Visit: Admitting: Endocrinology

## 2023-01-13 ENCOUNTER — Encounter: Payer: Self-pay | Admitting: Endocrinology

## 2023-01-13 VITALS — BP 120/90 | HR 73 | Ht 63.0 in | Wt 173.8 lb

## 2023-01-13 DIAGNOSIS — E039 Hypothyroidism, unspecified: Secondary | ICD-10-CM

## 2023-01-13 DIAGNOSIS — N951 Menopausal and female climacteric states: Secondary | ICD-10-CM

## 2023-01-13 DIAGNOSIS — E063 Autoimmune thyroiditis: Secondary | ICD-10-CM

## 2023-01-13 LAB — TSH
TSH: 5.23 u[IU]/mL (ref 0.35–5.50)
TSH: 4.2 u[IU]/mL (ref 0.35–5.50)

## 2023-01-13 LAB — FOLLICLE STIMULATING HORMONE: FSH: 82.8 m[IU]/mL

## 2023-01-13 LAB — T4, FREE
Free T4: 0.79 ng/dL (ref 0.60–1.60)
Free T4: 0.8 ng/dL (ref 0.60–1.60)

## 2023-01-13 MED ORDER — LEVOTHYROXINE SODIUM 112 MCG PO TABS: 112.0000 ug | ORAL_TABLET | Freq: Every day | ORAL | Status: DC

## 2023-01-13 NOTE — Progress Notes (Signed)
Outpatient Endocrinology Note Sydney Cate Oravec, MD  01/15/23  Patient's Name: Sydney Vaughn    DOB: 03/23/1976    MRN: [id number]  REASON OF VISIT: Follow-up for hypothyroidism  REFERRING PROVIDER:   PCP: Medicine, [facility]  HISTORY OF PRESENT ILLNESS:   Sydney Vaughn is a 47 y.o. old female with past medical history as listed below is presented for a follow up of hypothyroidism.  Patient was last seen by Dr. Lucianne Muss in April 2024.  Pertinent Thyroid History: Patient was diagnosed with hypothyroidism in July 2017.  She has been on thyroid hormone replacement since the diagnosis.  Initially started on levothyroxine 25 mcg daily and dose was gradually increased over time.  At the time of presentation she had symptoms of fatigue, weight gain and hair loss.  Per office note by Dr. Lucianne Muss because of abnormal PET scan done for her lymphoma showing activity in the thyroid diffusely, her PCP did an ultrasound which was unremarkable and antibodies which were positive as expected.   Weight management : Patient started management for weight loss from 2017 she had seen dietitian initially and modified diet.  She had tried weight loss medication in the past including phentermine and Topamax, started in March 2018, lost about 18 pounds of weight at that time.  In 2019 Topamax was stopped because of her feeling that it was causing memory difficulties.  She was also on phentermine which was stopped around late 2023 due to feeling of jitteriness.  She had taken Contrave which was stopped due to nausea and dizziness, with first tablet.  Unable to get [facility] not covered by her insurance.  Patient has been doing lifestyle modification with exercise bike or rowing machine as well as walking for exercise.  She has been following diet plan and trying to stay natural.  She does not want to try any more weight loss medications.   Interval history 01/15/23 Patient has been taking levothyroxine 100  mcg daily.  She usually takes with coffee with sometimes with creamer however she has been doing this for a long time.  Sometimes she forgets and takes levothyroxine in the evening however almost all the time she has been taking levothyroxine in the early morning.  She complains of fatigue, somewhat constipation, hair loss.  She has lost about 10-15 pounds of weight in last 4 months according to our scale today.    She complains of mood changes, muscle ache and hot flashes at times.  She is wondering about if she is going through menopause.  She had recent lab with high normal TSH of 5.23 and normal free T4 of 0.79 as follows.   Latest Reference Range & Units 01/12/23 08:08  TSH 0.35 - 5.50 uIU/mL 5.23  T4,Free(Direct) 0.60 - 1.60 ng/dL 4.09    REVIEW OF SYSTEMS:  As per history of present illness.   PAST MEDICAL HISTORY: Past Medical History:  Diagnosis Date   Adult acne    ANA positive    Anemia    iron infusions   Asthma    Chronic sinusitis    CT and MRI.    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   Fibromyalgia    Foot fracture, left 08/2012   History of appendicitis 09/2006   History of blood transfusion    last time 02/2018 hysterectomy surgery   Hypothyroidism    Intramural uterine fibroid 2019   Iron deficiency    Menorrhagia    Metatarsal  fracture 10/2012   Right Proximal fifth metatarsal fracture   Obesity    Panic attack    at bedtime, awakes from sleep   Pelvic pain    resolved   Polyarthralgia    was seeing rheumatology   PONV (postoperative nausea and vomiting)     PAST SURGICAL HISTORY: Past Surgical History:  Procedure Laterality Date   ABDOMINAL HYSTERECTOMY  04/2018   APPENDECTOMY     BUNIONECTOMY Left    DILATION AND EVACUATION     PAROTIDECTOMY Right 03/04/2020   Procedure: right superficial PAROTIDECTOMY, with nerve preservation;  Surgeon: Christia Reading, MD;  Location: [facility] OR;  Service: ENT;  Laterality: Right;   TUBAL LIGATION       ALLERGIES: Allergies  Allergen Reactions   Contrave [Naltrexone-Bupropion Hcl Er] Nausea Only    Nausea and dizziness   Oxycodone-Acetaminophen Er     Swollen face, eyes and caused itching    Rituximab-Abbs [Rituximab] Nausea Only and Other (See Comments)    Dizziness, "felt like something in throat", headache    FAMILY HISTORY:  Family History  Problem Relation Age of Onset   Thyroid disease Mother    Sinusitis Sister    Sinusitis Brother    Lupus Daughter        [facility] at [facility]   Allergies Daughter    Colon cancer Neg Hx    Colon polyps Neg Hx    Esophageal cancer Neg Hx    Rectal cancer Neg Hx    Stomach cancer Neg Hx     SOCIAL HISTORY: Social History   Socioeconomic History   Marital status: Married    Spouse name: Not on file   Number of children: 4   Years of education: Not on file   Highest education level: Not on file  Occupational History    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 Sydney Vaughn lives in [place] with her husband and children. She has 4 daughters: ages ranging from 72 yo- 55 yo. 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     Social Determinants of Health   Financial Resource Strain: Low Risk  (09/13/2022)   Received from [facility], [facility]   Overall Financial Resource Strain (CARDIA)    Difficulty of Paying Living Expenses: Not very hard  Food Insecurity: No Food Insecurity (09/13/2022)   Received from [facility], [facility]   Hunger Vital Sign    Worried About Running Out of Food in the Last  Year: Never true    Ran Out of Food in the Last Year: Never true  Transportation Needs: No Transportation Needs (09/13/2022)   Received from [employer], [facility]   PRAPARE - Transportation    Lack of Transportation (Medical): No    Lack of Transportation (Non-Medical): No  Physical Activity: Insufficiently Active (09/13/2022)   Received from [facility], [facility]   Exercise Vital Sign    Days of Exercise per Week: 1 day  Minutes of Exercise per Session: 40 min  Stress: Stress Concern Present (09/13/2022)   Received from [facility], [facility] of Occupational Health - Occupational Stress Questionnaire    Feeling of Stress : Very much  Social Connections: Somewhat Isolated (09/13/2022)   Received from [facility], [facility]   Social Network    How would you rate your social network (family, work, friends)?: Restricted participation with some degree of social isolation    MEDICATIONS:  Current Outpatient Medications  Medication Sig Dispense Refill   albuterol (PROVENTIL HFA;VENTOLIN HFA) 108 (90 Base) MCG/ACT inhaler Inhale 1-2 puffs into the lungs every 4 (four) hours as needed for wheezing or shortness of breath. 1 Inhaler 5   ondansetron (ZOFRAN-ODT) 4 MG disintegrating tablet Take 1 tablet (4 mg total) by mouth every 8 (eight) hours as needed. 20 tablet 0   pantoprazole (PROTONIX) 40 MG tablet Take 1 tablet (40 mg total) by mouth 2 (two) times daily. 60 tablet 1   zolpidem (AMBIEN) 10 MG tablet Take by mouth.     HYDROcodone bit-homatropine (HYCODAN) 5-1.5 MG/5ML syrup Take 5 mLs by mouth every 6 (six) hours as needed for cough. (Patient not taking: Reported on 01/13/2023) 120 mL 0   levothyroxine (SYNTHROID) 112 MCG tablet Take 1 tablet (112 mcg total) by mouth daily before breakfast.     phentermine 15 MG capsule TAKE 1 CAPSULE BY MOUTH EVERY DAY IN THE MORNING (Patient not taking: Reported on 01/13/2023) 30 capsule 3   sertraline (ZOLOFT)  50 MG tablet Take by mouth.     sucralfate (CARAFATE) 1 GM/10ML suspension Take 10 mLs (1 g total) by mouth 2 (two) times daily. (Patient not taking: Reported on 01/13/2023) 420 mL 0   No current facility-administered medications for this visit.    PHYSICAL EXAM: Vitals:   01/13/23 0802  BP: (!) 120/90  Pulse: 73  SpO2: 98%  Weight: 173 lb 12.8 oz (78.8 kg)  Height: 5\' 3"  (1.6 m)   Body mass index is 30.79 kg/m.  Wt Readings from Last 3 Encounters:  01/13/23 173 lb 12.8 oz (78.8 kg)  10/13/22 179 lb (81.2 kg)  08/25/22 187 lb (84.8 kg)     General: Well developed, well nourished female in no apparent distress.  HEENT: AT/[place], no external lesions. Hearing intact to the spoken word Eyes: Conjunctiva clear and no icterus. Neck: Trachea midline, neck supple without appreciable thyromegaly or lymphadenopathy and no palpable thyroid nodules Lungs: Clear to auscultation, no wheeze. Respirations not labored Heart: S1S2, Regular in rate and rhythm. No loud murmurs Abdomen: Soft, non tender, non distended Neurologic: Alert, oriented, normal speech, deep tendon biceps reflexes normal,  no gross focal neurological deficit Extremities: No pedal pitting edema, no tremors of outstretched hands Skin: Warm, color good.  Psychiatric: Does not appear depressed or anxious  PERTINENT HISTORIC LABORATORY AND IMAGING STUDIES:  All pertinent laboratory results were reviewed. Please see HPI also for further details.   TSH  Date Value Ref Range Status  01/13/2023 4.20 0.35 - 5.50 uIU/mL Final  01/12/2023 5.23 0.35 - 5.50 uIU/mL Final  08/22/2022 1.43 0.35 - 5.50 uIU/mL Final     ASSESSMENT / PLAN  1. Acquired autoimmune hypothyroidism   2. Perimenopausal symptom   3. Primary hypothyroidism    -Patient has been taking levothyroxine 100 mcg daily.  She has complaints of fatigue. -Recent thyroid function test with hide normal TSH of 5.23.  Plan: -Increase levothyroxine to 112 mcg daily with  the goal of TSH in mid normal range. -Check TSH, free T4 in 2 months.  # Patient complains of hot flashes and concern about possible menopause/Has perimenopausal symptoms. -Will check FSH   Diagnoses and all orders for this visit:  Acquired autoimmune hypothyroidism -     T4, free -     TSH  Perimenopausal symptom -     FSH  Primary hypothyroidism  Other orders -     levothyroxine (SYNTHROID) 112 MCG tablet; Take 1 tablet (112 mcg total) by mouth daily before breakfast.    DISPOSITION Follow up in clinic in 6 months suggested.  All questions answered and patient verbalized understanding of the plan.  Sydney Ghina Bittinger, MD [facility] Endocrinology [facility] [address] [place], [address] [place], [place] [address] Phone # [phone]  At least part of this note was generated using voice recognition software. Inadvertent word errors may have occurred, which were not recognized during the proofreading process.

## 2023-01-13 NOTE — Patient Instructions (Signed)
Labs today.  Labs in 2 months for thyroid.  I have increase levothyroxine to 112 mcg daily.

## 2023-01-14 ENCOUNTER — Encounter: Payer: Self-pay | Admitting: Endocrinology

## 2023-01-15 ENCOUNTER — Other Ambulatory Visit: Payer: Self-pay | Admitting: Endocrinology

## 2023-01-15 DIAGNOSIS — E039 Hypothyroidism, unspecified: Secondary | ICD-10-CM

## 2023-01-16 ENCOUNTER — Other Ambulatory Visit: Payer: Self-pay | Admitting: Gastroenterology

## 2023-01-18 NOTE — Telephone Encounter (Signed)
I called the patient and talked.  She has a question about her FSH level indicating menopause.  I recommended for OB/GYN visit for treatment menopausal symptoms.

## 2023-01-19 ENCOUNTER — Encounter: Payer: Self-pay | Admitting: Endocrinology

## 2023-01-20 ENCOUNTER — Other Ambulatory Visit: Payer: Self-pay

## 2023-01-20 DIAGNOSIS — E039 Hypothyroidism, unspecified: Secondary | ICD-10-CM

## 2023-01-20 MED ORDER — LEVOTHYROXINE SODIUM 112 MCG PO TABS
112.0000 ug | ORAL_TABLET | Freq: Every day | ORAL | Status: DC
Start: 2023-01-20 — End: 2023-07-20

## 2023-03-06 ENCOUNTER — Inpatient Hospital Stay (HOSPITAL_BASED_OUTPATIENT_CLINIC_OR_DEPARTMENT_OTHER): Admitting: Hematology and Oncology

## 2023-03-06 ENCOUNTER — Other Ambulatory Visit: Payer: Self-pay | Admitting: Hematology and Oncology

## 2023-03-06 ENCOUNTER — Telehealth: Payer: Self-pay

## 2023-03-06 ENCOUNTER — Other Ambulatory Visit: Payer: Self-pay | Admitting: Gastroenterology

## 2023-03-06 ENCOUNTER — Inpatient Hospital Stay: Attending: Hematology and Oncology

## 2023-03-06 ENCOUNTER — Encounter: Payer: Self-pay | Admitting: Hematology and Oncology

## 2023-03-06 ENCOUNTER — Other Ambulatory Visit: Payer: Self-pay

## 2023-03-06 VITALS — BP 136/88 | HR 80 | Temp 97.6°F | Resp 18 | Ht 63.0 in | Wt 173.6 lb

## 2023-03-06 DIAGNOSIS — C8581 Other specified types of non-Hodgkin lymphoma, lymph nodes of head, face, and neck: Secondary | ICD-10-CM

## 2023-03-06 DIAGNOSIS — Z8572 Personal history of non-Hodgkin lymphomas: Secondary | ICD-10-CM | POA: Diagnosis present

## 2023-03-06 LAB — CBC WITH DIFFERENTIAL (CANCER CENTER ONLY)
Abs Immature Granulocytes: 0.01 10*3/uL (ref 0.00–0.07)
Basophils Absolute: 0.1 10*3/uL (ref 0.0–0.1)
Basophils Relative: 1 %
Eosinophils Absolute: 0.1 10*3/uL (ref 0.0–0.5)
Eosinophils Relative: 1 %
HCT: 41.4 % (ref 36.0–46.0)
Hemoglobin: 14.4 g/dL (ref 12.0–15.0)
Immature Granulocytes: 0 %
Lymphocytes Relative: 27 %
Lymphs Abs: 1.8 10*3/uL (ref 0.7–4.0)
MCH: 31.7 pg (ref 26.0–34.0)
MCHC: 34.8 g/dL (ref 30.0–36.0)
MCV: 91.2 fL (ref 80.0–100.0)
Monocytes Absolute: 0.4 10*3/uL (ref 0.1–1.0)
Monocytes Relative: 7 %
Neutro Abs: 4.1 10*3/uL (ref 1.7–7.7)
Neutrophils Relative %: 64 %
Platelet Count: 268 10*3/uL (ref 150–400)
RBC: 4.54 MIL/uL (ref 3.87–5.11)
RDW: 12.7 % (ref 11.5–15.5)
WBC Count: 6.5 10*3/uL (ref 4.0–10.5)
nRBC: 0 % (ref 0.0–0.2)

## 2023-03-06 LAB — CMP (CANCER CENTER ONLY)
ALT: 9 U/L (ref 0–44)
AST: 14 U/L — ABNORMAL LOW (ref 15–41)
Albumin: 4.6 g/dL (ref 3.5–5.0)
Alkaline Phosphatase: 91 U/L (ref 38–126)
Anion gap: 7 (ref 5–15)
BUN: 9 mg/dL (ref 6–20)
CO2: 27 mmol/L (ref 22–32)
Calcium: 9.3 mg/dL (ref 8.9–10.3)
Chloride: 107 mmol/L (ref 98–111)
Creatinine: 0.84 mg/dL (ref 0.44–1.00)
GFR, Estimated: >60 mL/min (ref >60)
Glucose, Bld: 91 mg/dL (ref 70–99)
Potassium: 4.2 mmol/L (ref 3.5–5.1)
Sodium: 141 mmol/L (ref 135–145)
Total Bilirubin: 0.6 mg/dL (ref 0.3–1.2)
Total Protein: 7.6 g/dL (ref 6.5–8.1)

## 2023-03-06 LAB — LACTATE DEHYDROGENASE: LDH: 164 U/L (ref 98–192)

## 2023-03-06 MED ORDER — AMOXICILLIN-POT CLAVULANATE 875-125 MG PO TABS: 1.0000 | ORAL_TABLET | Freq: Two times a day (BID) | ORAL | 0 refills | Status: DC

## 2023-03-06 MED ORDER — FLUCONAZOLE 150 MG PO TABS: 150.0000 mg | ORAL_TABLET | Freq: Every day | ORAL | 0 refills | Status: DC

## 2023-03-06 NOTE — Progress Notes (Signed)
[facility] OFFICE PROGRESS NOTE  Patient Care Team: Medicine, [facility] as PCP - General (Family Medicine) Donzetta Starch, MD as Consulting Physician (Dermatology) Georgina Quint, MD as Referring Physician (Internal Medicine)  HISTORY OF PRESENTING ILLNESS: Discussed the use of AI scribe software for clinical note transcription with the patient, who gave verbal consent to proceed.  History of Present Illness   The patient, with a history of marginal zone lymphoma, presents with a three-week history of neck discomfort and swelling. She describes the discomfort as a knot in the back of her neck, which has been causing pain and limiting her ability to move her head. She has been managing the discomfort with Tylenol, initially thinking it was allergy related. She also reports facial swelling, which she attributes to sinus congestion. She has been self-treating with over-the-counter allergy medication, but the discomfort in her neck has not improved. She denies fever, attributing her constant use of Tylenol to manage her symptoms.  I verified her medication list today. She also uses albuterol as needed and takes Sudafed for allergies. She denies taking Zofran, Protonix, phentermine, Zoloft, or Carafate.         Assessment and Plan    History of lymphoma Patient reports a palpable mass in the neck for 3 weeks with associated pain. On examination, no discrete lymph node or mass was palpable. -Order CT scan of the neck to further evaluate.  Sinus Congestion Patient reports facial swelling and burning sensation, suspecting sinus congestion. Over-the-counter Sudafed and Tylenol have been used for symptom management. -Prescribe Augmentin for possible sinus infection but I told her that majority of sinus infection is not caused by bacterial infection, and she might not feel better even after finishing the antibiotics. Side-effects from Augmentin is  discussed -Prescribe Diflucan prophylactically to prevent yeast infection secondary to antibiotic use.  Follow-up Await CT scan results. If normal, consider prescribing steroids for possible allergy-related inflammation. If abnormal, patient to return for review and further management. If symptoms persist, consider referral to an Ear, Nose, and Throat specialist.          Orders Placed This Encounter  Procedures   CT Soft Tissue Neck W Contrast    Standing Status:   Future    Standing Expiration Date:   03/05/2024    Order Specific Question:   If indicated for the ordered procedure, I authorize the administration of contrast media per Radiology protocol    Answer:   Yes    Order Specific Question:   Does the patient have a contrast media/X-ray dye allergy?    Answer:   No    Order Specific Question:   Is patient pregnant?    Answer:   No    Order Specific Question:   Preferred imaging location?    Answer:   [facility]    All questions were answered. The patient knows to call the clinic with any problems, questions or concerns. The total time spent in the appointment was 30 minutes encounter with patients including review of chart and various tests results, discussions about plan of care and coordination of care plan   Artis Delay, MD 03/06/2023 1:30 PM  REVIEW OF SYSTEMS:  All other systems were reviewed with the patient and are negative.  I have reviewed the past medical history, past surgical history, social history and family history with the patient and they are unchanged from previous note.  ALLERGIES:  is allergic to contrave [naltrexone-bupropion hcl er], oxycodone-acetaminophen  er, and rituximab-abbs [rituximab].  MEDICATIONS:  Current Outpatient Medications  Medication Sig Dispense Refill   amoxicillin-clavulanate (AUGMENTIN) 875-125 MG tablet Take 1 tablet by mouth 2 (two) times daily. 14 tablet 0   fluconazole (DIFLUCAN) 150 MG tablet Take 1 tablet (150 mg  total) by mouth daily. 1 tablet 0   albuterol (PROVENTIL HFA;VENTOLIN HFA) 108 (90 Base) MCG/ACT inhaler Inhale 1-2 puffs into the lungs every 4 (four) hours as needed for wheezing or shortness of breath. 1 Inhaler 5   levothyroxine (SYNTHROID) 112 MCG tablet Take 1 tablet (112 mcg total) by mouth daily before breakfast.     pantoprazole (PROTONIX) 40 MG tablet Take 1 tablet (40 mg total) by mouth 2 (two) times daily. 60 tablet 1   phentermine 15 MG capsule TAKE 1 CAPSULE BY MOUTH EVERY DAY IN THE MORNING (Patient not taking: Reported on 01/13/2023) 30 capsule 3   sucralfate (CARAFATE) 1 GM/10ML suspension Take 10 mLs (1 g total) by mouth 2 (two) times daily. (Patient not taking: Reported on 01/13/2023) 420 mL 0   zolpidem (AMBIEN) 10 MG tablet Take by mouth.     No current facility-administered medications for this visit.    SUMMARY OF ONCOLOGIC HISTORY: Oncology History  Marginal zone lymphoma of lymph nodes of neck (HCC)  02/07/2020 Imaging   Right parotid and adjacent subcutaneous mass without diagnostic feature. Lymphoma, sarcoid, or IgG 4 related disease are considered given the infiltrative appearance. If a primary parotid neoplasm the extent of the mass would be concerning for aggressive disease. The lesion extends to the subcutaneous preauricular space and should be readily amenable to biopsy.   03/04/2020 Surgery   Procedure: Right superficial parotidectomy with dissection of facial nerve Surgeon: Jenne Pane Anesth: General and local with 1% lidocaine with 1:100,000 epinephrine Findings: Right superior parotid with superficial mass dissected easily from nerve branches.   03/04/2020 Pathology Results   FINAL MICROSCOPIC DIAGNOSIS:   A. PAROTID, RIGHT, PAROTIDECTOMY:  - Atypical lymphoid population, see comment.  - Benign salivary gland tissue.   COMMENT:   There is effacement of the salivary gland tissue by a monotonous population of small lymphoid cells with moderate clear  cytoplasm. There are lymphoepithelial type lesions. Immunohistochemistry reveals the atypical lymphoid cells are positive for CD20 and bcl-2. There are residual disrupted germinal centers (CD23, bcl-6, CD10-positive; bcl-2 negative). CD3, CD43, and CD5 reveal admixed T-cells. CyclinD1 is negative. Ki-67 is overall low and elevated in residual germinal centers. CD138 reveals only scattered plasma cells which appear polytypic by light chain in situ hybridization. 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 While the morphology is suspicious, there is no aberrant phenotype or monotypic plasma cell population. Thus, PCR for B-cell gene rearrangement will be ordered to assess clonality (results to be reported in an addendum).  ADDENDUM:   PCR for B-cell gene rearrangement is POSITIVE.   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    04/06/2020 PET scan   1. Low level FDG uptake in the right parotid bed compatible with the history of recent surgery. 2. No evidence for hypermetabolic metastatic disease in the neck, chest, abdomen, or pelvis. 3. Diffuse hypermetabolism in the thyroid parenchyma, likely inflammatory.   04/06/2020 Cancer Staging   Staging form: Hodgkin and Non-Hodgkin Lymphoma, AJCC 8th Edition - Clinical stage from 04/06/2020:  Stage I (Marginal zone lymphoma) - Signed by Artis Delay, MD on 04/06/2020   02/17/2022 Imaging   1. Irregular infiltrative appearing area of soft tissue thickening and contrast enhancement involving the right parotid gland and adjacent preauricular subcutaneous and skin. This extends inferiorly approximately to the level of the mandibular angle and superiorly beyond the field of view. Findings are  more pronounced than expected for postsurgical changes. Findings are concerning for recurrent lymphoma. No lymphadenopathy identified. 2. Enlarged uniformly enhancing thyroid gland, similar to prior CT   03/03/2022 PET scan   1. Focal area of increased metabolic activity in the RIGHT pre-auricular region corresponding to the area of concern on CT imaging. Findings remain concerning for recurrent lymphoma. Showing greater FDG uptake than pretreatment images and if documented as recurrence would be compatible with Deauville criteria 5 disease. 2. Marked enlargement of the thyroid gland with increased metabolic activity. Findings may relate to thyroiditis. Correlate with thyroid function. Metabolic activity while diffuse on the previous study has increased in the interval. In the absence of known or biochemical evidence of current active thyroiditis would also consider thyroid ultrasound and biopsy as warranted given that there is likely recurrence in the neck. 3. No additional sites of disease identified. 4. Normal spleen and normal liver. 5. Small non FDG avid lymph nodes at the thoracic inlet are unchanged dating back to 2021.     03/16/2022 - 04/04/2022 Chemotherapy   Patient is on Treatment Plan : ITP Rituximab q7d x 4 cycles     06/07/2022 PET scan   1. No evidence of residual metabolically active lymphoma (Deauville 1). 2. Low level activity within the distal esophagus, likely esophagitis.     PHYSICAL EXAMINATION: ECOG PERFORMANCE STATUS: 1 - Symptomatic but completely ambulatory  Vitals:   03/06/23 1311  BP: 136/88  Pulse: 80  Resp: 18  Temp: 97.6 F (36.4 C)  SpO2: 99%   Filed Weights   03/06/23 1311  Weight: 173 lb 9.6 oz (78.7 kg)    GENERAL:alert, no distress and comfortable SKIN: skin color, texture, turgor are normal, no rashes or significant lesions EYES: normal, conjunctiva are pink and non-injected, sclera clear OROPHARYNX:no exudate, no erythema and lips, buccal  mucosa, and tongue normal  NECK: supple, thyroid normal size, non-tender, without nodularity LYMPH:  no palpable lymphadenopathy in the cervical, axillary or inguinal LUNGS: clear to auscultation and percussion with normal breathing effort HEART: regular rate & rhythm and no murmurs and no lower extremity edema ABDOMEN:abdomen soft, non-tender and normal bowel sounds Musculoskeletal:no cyanosis of digits and no clubbing  PSYCH: alert & oriented x 3 with fluent speech NEURO: no focal motor/sensory deficits  LABORATORY DATA:  I have reviewed the data as listed    Component Value Date/Time   NA 139 10/13/2022 2013   NA 139 07/09/2016 1052   K 3.7 10/13/2022 2013   CL 104 10/13/2022 2013   CO2 24 10/13/2022 2013   GLUCOSE 134 (H) 10/13/2022 2013   BUN 5 (L) 10/13/2022 2013   BUN 7 07/09/2016 1052   CREATININE 1.01 (H) 10/13/2022 2013   CREATININE 0.89 03/03/2022 1510   CREATININE 0.95 04/20/2016 1632   CALCIUM 9.0 10/13/2022 2013   PROT 7.5 03/03/2022 1510   PROT 6.7 07/09/2016 1052   ALBUMIN 4.3 03/03/2022 1510   ALBUMIN 4.1 07/09/2016 1052   AST 34 03/03/2022 1510   ALT 28 03/03/2022 1510   ALKPHOS 79 03/03/2022 1510   BILITOT 0.7 03/03/2022 1510   GFRNONAA >60 10/13/2022  2013   GFRNONAA >60 03/03/2022 1510   GFRNONAA 74 04/20/2016 1632   GFRAA 121 07/09/2016 1052   GFRAA 85 04/20/2016 1632    No results found for: "SPEP", "UPEP"  Lab Results  Component Value Date   WBC 6.5 03/06/2023   NEUTROABS 4.1 03/06/2023   HGB 14.4 03/06/2023   HCT 41.4 03/06/2023   MCV 91.2 03/06/2023   PLT 268 03/06/2023      Chemistry      Component Value Date/Time   NA 139 10/13/2022 2013   NA 139 07/09/2016 1052   K 3.7 10/13/2022 2013   CL 104 10/13/2022 2013   CO2 24 10/13/2022 2013   BUN 5 (L) 10/13/2022 2013   BUN 7 07/09/2016 1052   CREATININE 1.01 (H) 10/13/2022 2013   CREATININE 0.89 03/03/2022 1510   CREATININE 0.95 04/20/2016 1632      Component Value Date/Time    CALCIUM 9.0 10/13/2022 2013   ALKPHOS 79 03/03/2022 1510   AST 34 03/03/2022 1510   ALT 28 03/03/2022 1510   BILITOT 0.7 03/03/2022 1510

## 2023-03-06 NOTE — Telephone Encounter (Signed)
Called regarding mychart message and scheduled appts for today. She is aware of appts.

## 2023-03-09 ENCOUNTER — Ambulatory Visit (HOSPITAL_BASED_OUTPATIENT_CLINIC_OR_DEPARTMENT_OTHER)
Admission: RE | Admit: 2023-03-09 | Discharge: 2023-03-09 | Disposition: A | Discharge status: Home / Self Care | Source: Ambulatory Visit | Attending: Hematology and Oncology | Admitting: Hematology and Oncology

## 2023-03-09 ENCOUNTER — Encounter (HOSPITAL_BASED_OUTPATIENT_CLINIC_OR_DEPARTMENT_OTHER): Payer: Self-pay

## 2023-03-09 DIAGNOSIS — C8581 Other specified types of non-Hodgkin lymphoma, lymph nodes of head, face, and neck: Secondary | ICD-10-CM | POA: Diagnosis present

## 2023-03-09 MED ORDER — IOHEXOL 300 MG/ML  SOLN
100.0000 mL | Freq: Once | INTRAMUSCULAR | Status: AC | PRN
  Administered 2023-03-09: 75 mL via INTRAVENOUS

## 2023-03-15 ENCOUNTER — Other Ambulatory Visit

## 2023-03-15 ENCOUNTER — Encounter: Payer: Self-pay | Admitting: Endocrinology

## 2023-03-16 ENCOUNTER — Telehealth: Payer: Self-pay

## 2023-03-16 NOTE — Telephone Encounter (Signed)
Called her per Dr. Bertis Ruddy, CT showed no abnormal lymph nodes. Radiologist says she has DJD that could cause neck pain. Please reach out to PCP for referral for neck pain/ DJD. She verbalized understanding and will call PCP.

## 2023-03-20 ENCOUNTER — Telehealth: Payer: Self-pay

## 2023-03-20 NOTE — Telephone Encounter (Signed)
Faxed results of 10/24 CT soft tissue neck to PCP, [facility]. Faxed to [phone], received fax confirmation.

## 2023-03-21 NOTE — Progress Notes (Signed)
Faxed results of 10/24 CT soft tissue neck to PCP, [facility]. Faxed to [phone], received fax confirmation.

## 2023-04-03 ENCOUNTER — Encounter (HOSPITAL_BASED_OUTPATIENT_CLINIC_OR_DEPARTMENT_OTHER): Payer: Self-pay

## 2023-04-03 ENCOUNTER — Emergency Department (HOSPITAL_BASED_OUTPATIENT_CLINIC_OR_DEPARTMENT_OTHER)

## 2023-04-03 ENCOUNTER — Other Ambulatory Visit: Payer: Self-pay

## 2023-04-03 ENCOUNTER — Emergency Department (HOSPITAL_BASED_OUTPATIENT_CLINIC_OR_DEPARTMENT_OTHER)
Admission: EM | Admit: 2023-04-03 | Discharge: 2023-04-03 | Disposition: A | Discharge status: Home / Self Care | Attending: Emergency Medicine | Admitting: Emergency Medicine

## 2023-04-03 ENCOUNTER — Other Ambulatory Visit (HOSPITAL_BASED_OUTPATIENT_CLINIC_OR_DEPARTMENT_OTHER): Payer: Self-pay

## 2023-04-03 DIAGNOSIS — Z7951 Long term (current) use of inhaled steroids: Secondary | ICD-10-CM | POA: Diagnosis not present

## 2023-04-03 DIAGNOSIS — M545 Low back pain, unspecified: Secondary | ICD-10-CM | POA: Diagnosis not present

## 2023-04-03 DIAGNOSIS — J45909 Unspecified asthma, uncomplicated: Secondary | ICD-10-CM | POA: Insufficient documentation

## 2023-04-03 DIAGNOSIS — R1032 Left lower quadrant pain: Secondary | ICD-10-CM | POA: Insufficient documentation

## 2023-04-03 DIAGNOSIS — M79605 Pain in left leg: Secondary | ICD-10-CM | POA: Insufficient documentation

## 2023-04-03 HISTORY — DX: Non-Hodgkin lymphoma, unspecified, unspecified site: C85.90

## 2023-04-03 LAB — COMPREHENSIVE METABOLIC PANEL
ALT: 11 U/L (ref 0–44)
AST: 14 U/L — ABNORMAL LOW (ref 15–41)
Albumin: 4.3 g/dL (ref 3.5–5.0)
Alkaline Phosphatase: 84 U/L (ref 38–126)
Anion gap: 6 (ref 5–15)
BUN: 10 mg/dL (ref 6–20)
CO2: 26 mmol/L (ref 22–32)
Calcium: 8.7 mg/dL — ABNORMAL LOW (ref 8.9–10.3)
Chloride: 108 mmol/L (ref 98–111)
Creatinine, Ser: 0.78 mg/dL (ref 0.44–1.00)
GFR, Estimated: >60 mL/min (ref >60)
Glucose, Bld: 122 mg/dL — ABNORMAL HIGH (ref 70–99)
Potassium: 3.8 mmol/L (ref 3.5–5.1)
Sodium: 140 mmol/L (ref 135–145)
Total Bilirubin: 0.7 mg/dL (ref <1.2)
Total Protein: 7 g/dL (ref 6.5–8.1)

## 2023-04-03 LAB — CBC WITH DIFFERENTIAL/PLATELET
Abs Immature Granulocytes: 0.02 10*3/uL (ref 0.00–0.07)
Basophils Absolute: 0.1 10*3/uL (ref 0.0–0.1)
Basophils Relative: 1 %
Eosinophils Absolute: 0.2 10*3/uL (ref 0.0–0.5)
Eosinophils Relative: 3 %
HCT: 40.2 % (ref 36.0–46.0)
Hemoglobin: 14 g/dL (ref 12.0–15.0)
Immature Granulocytes: 0 %
Lymphocytes Relative: 24 %
Lymphs Abs: 1.4 10*3/uL (ref 0.7–4.0)
MCH: 31.7 pg (ref 26.0–34.0)
MCHC: 34.8 g/dL (ref 30.0–36.0)
MCV: 91.2 fL (ref 80.0–100.0)
Monocytes Absolute: 0.5 10*3/uL (ref 0.1–1.0)
Monocytes Relative: 10 %
Neutro Abs: 3.5 10*3/uL (ref 1.7–7.7)
Neutrophils Relative %: 62 %
Platelets: 245 10*3/uL (ref 150–400)
RBC: 4.41 MIL/uL (ref 3.87–5.11)
RDW: 13.3 % (ref 11.5–15.5)
WBC: 5.6 10*3/uL (ref 4.0–10.5)
nRBC: 0 % (ref 0.0–0.2)

## 2023-04-03 LAB — URINALYSIS, ROUTINE W REFLEX MICROSCOPIC
Bilirubin Urine: NEGATIVE
Glucose, UA: NEGATIVE mg/dL
Ketones, ur: NEGATIVE mg/dL
Leukocytes,Ua: NEGATIVE
Nitrite: NEGATIVE
Protein, ur: NEGATIVE mg/dL
Specific Gravity, Urine: 1.022 (ref 1.005–1.030)
pH: 6.5 (ref 5.0–8.0)

## 2023-04-03 LAB — HCG, SERUM, QUALITATIVE: Preg, Serum: NEGATIVE

## 2023-04-03 MED ORDER — KETOROLAC TROMETHAMINE 15 MG/ML IJ SOLN
15.0000 mg | Freq: Once | INTRAMUSCULAR | Status: AC
  Administered 2023-04-03: 15 mg via INTRAVENOUS
  Filled 2023-04-03: qty 1

## 2023-04-03 MED ORDER — LIDOCAINE 5 % EX PTCH
1.0000 | MEDICATED_PATCH | CUTANEOUS | 0 refills | Status: AC
  Filled 2023-04-03: qty 30, 30d supply, fill #0

## 2023-04-03 MED ORDER — IOHEXOL 350 MG/ML SOLN
100.0000 mL | Freq: Once | INTRAVENOUS | Status: AC | PRN
  Administered 2023-04-03: 75 mL via INTRAVENOUS

## 2023-04-03 MED ORDER — LIDOCAINE 5 % EX PTCH
1.0000 | MEDICATED_PATCH | CUTANEOUS | Status: DC
  Administered 2023-04-03: 1 via TRANSDERMAL
  Filled 2023-04-03: qty 1

## 2023-04-03 NOTE — ED Notes (Signed)
Discharge paperwork given and verbally understood. 

## 2023-04-03 NOTE — ED Triage Notes (Signed)
In for eval of lower back pain x1 week. Now pain wraps around left flank around to abd with nausea and vomiting. Last BM Saturday and normal. Urinary frequency. Denies dysuria.

## 2023-04-03 NOTE — Discharge Instructions (Addendum)
While you are in the emergency department, you had blood work and CT imaging done.  These tests were normal.  At this time, I do not have a clear cause for what is causing your symptoms.  You can take Tylenol and ibuprofen at home.  Do not apply lidocaine patch to the area of discomfort.  Please follow-up with your primary care doctor this Thursday.

## 2023-04-03 NOTE — ED Triage Notes (Signed)
Reports having a fever last night of 101.6. Took 2 BC powders this am.

## 2023-04-03 NOTE — ED Provider Notes (Signed)
Sydney Vaughn EMERGENCY DEPARTMENT AT [facility] Provider Note  CSN: [id number] Arrival date & time: 04/03/23 2841  Chief Complaint(s) Flank Pain  HPI Sydney Vaughn is a 47 y.o. female here today for pain on her left side.  She says she has been having the symptoms for about a week, but they have worsened.  She describes a pain in her left lower side, with some radiation to her back.  She also has "shooting pain "down the front of her left leg.  Patient reports that she had a fever last night.   Past Medical History Past Medical History:  Diagnosis Date   Adult acne    ANA positive    Anemia    iron infusions   Asthma    Chronic sinusitis    CT and MRI.    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   Fibromyalgia    Foot fracture, left 08/2012   History of appendicitis 09/2006   History of blood transfusion    last time 02/2018 hysterectomy surgery   Hypothyroidism    Intramural uterine fibroid 2019   Iron deficiency    Lymphoma (HCC)    x2, completed last round of chemo Jan 2024   Menorrhagia    Metatarsal fracture 10/2012   Right Proximal fifth metatarsal fracture   Obesity    Panic attack    at bedtime, awakes from sleep   Pelvic pain    resolved   Polyarthralgia    was seeing rheumatology   PONV (postoperative nausea and vomiting)    Patient Active Problem List   Diagnosis Date Noted   Esophagitis 06/09/2022   Yeast infection involving the vagina and surrounding area 03/21/2022   Marginal zone lymphoma of lymph nodes of neck (HCC) 04/01/2020   Neck stiffness 04/01/2020   Neuropathic pain 04/01/2020   Cerumen impaction 03/19/2020   Parotid neoplasm 03/04/2020   Anxiety disorder due to medical condition 04/20/2016   Vitamin D deficiency 12/14/2015   Acquired hypothyroidism 12/14/2015   Weight gain 12/03/2015   Positive ANA (antinuclear antibody) 03/18/2014   BMI 32.0-32.9,adult 12/11/2013   Right foot pain 11/04/2012   Allergic rhinitis  08/15/2011   Home Medication(s) Prior to Admission medications   Medication Sig Start Date End Date Taking? Authorizing Provider  acetaminophen (TYLENOL) 500 MG tablet Take 500 mg by mouth daily as needed for mild pain (pain score 1-3).    [provider]  albuterol (PROVENTIL HFA;VENTOLIN HFA) 108 (90 Base) MCG/ACT inhaler Inhale 1-2 puffs into the lungs every 4 (four) hours as needed for wheezing or shortness of breath. 08/29/18   Wandra Feinstein, MD  amoxicillin-clavulanate (AUGMENTIN) 875-125 MG tablet Take 1 tablet by mouth 2 (two) times daily. 03/06/23   Artis Delay, MD  fluconazole (DIFLUCAN) 150 MG tablet Take 1 tablet (150 mg total) by mouth daily. 03/06/23   Artis Delay, MD  levothyroxine (SYNTHROID) 112 MCG tablet Take 1 tablet (112 mcg total) by mouth daily before breakfast. 01/20/23   Th[place], [place], MD  pseudoephedrine (SUDAFED) 120 MG 12 hr tablet Take 120 mg by mouth every 12 (twelve) hours as needed for congestion.    [provider]  zolpidem (AMBIEN) 10 MG tablet Take by mouth. 06/08/22 01/13/23  [provider]  Past Surgical History Past Surgical History:  Procedure Laterality Date   ABDOMINAL HYSTERECTOMY  04/2018   APPENDECTOMY     BUNIONECTOMY Left    DILATION AND EVACUATION     PAROTIDECTOMY Right 03/04/2020   Procedure: right superficial PAROTIDECTOMY, with nerve preservation;  Surgeon: Christia Reading, MD;  Location: [facility] OR;  Service: ENT;  Laterality: Right;   TUBAL LIGATION     Family History Family History  Problem Relation Age of Onset   Thyroid disease Mother    Sinusitis Sister    Sinusitis Brother    Lupus Daughter        treated at [facility]   Allergies Daughter    Colon cancer Neg Hx    Colon polyps Neg Hx    Esophageal cancer Neg Hx    Rectal cancer Neg Hx    Stomach cancer Neg Hx     Social  History Social History   Tobacco Use   Smoking status: Never   Smokeless tobacco: Never  Vaping Use   Vaping status: Never Used  Substance Use Topics   Alcohol use: Not Currently    Comment: occ    Drug use: No   Allergies Contrave [naltrexone-bupropion hcl er], Oxycodone-acetaminophen er, and Rituximab-abbs [rituximab]  Review of Systems Review of Systems  Physical Exam Vital Signs  I have reviewed the triage vital signs BP 128/88 (BP Location: Right Arm)   Pulse 84   Temp 98.1 F (36.7 C) (Oral)   Resp 16   Ht 5\' 3"  (1.6 m)   Wt 77.1 kg   LMP 04/14/2018   SpO2 98%   BMI 30.11 kg/m   Physical Exam Constitutional:      Appearance: She is not toxic-appearing.  Pulmonary:     Effort: Pulmonary effort is normal.  Abdominal:     General: Abdomen is flat.     Palpations: Abdomen is soft.     Tenderness: There is abdominal tenderness.     Comments: Left lower quadrant tenderness, no flank tenderness  Musculoskeletal:        General: Normal range of motion.  Neurological:     General: No focal deficit present.     Mental Status: She is alert.     Sensory: No sensory deficit.     Motor: No weakness.     Gait: Gait normal.     ED Results and Treatments Labs (all labs ordered are listed, but only abnormal results are displayed) Labs Reviewed  COMPREHENSIVE METABOLIC PANEL  HCG, SERUM, QUALITATIVE  CBC WITH DIFFERENTIAL/PLATELET  URINALYSIS, ROUTINE W REFLEX MICROSCOPIC                                                                                                                          Radiology No results found.  Pertinent labs & imaging results that were available during my care of the patient were reviewed by me and considered in my medical decision making (see MDM for details).  Medications  Ordered in ED Medications - No data to display                                                                                                                                    Procedures Procedures  (including critical care time)  Medical Decision Making / ED Course   This patient presents to the ED for concern of left-sided abdominal pain, this involves an extensive number of treatment options, and is a complaint that carries with it a high risk of complications and morbidity.  The differential diagnosis includes pyelonephritis, nephrolithiasis, diverticulitis, colitis, lateral femoral cutaneous nerve syndrome, less likely sided back pain.  MDM: Based on the patient's history and exam, I think the symptoms are slightly unrelated.  Regarding the abdominal pain, could be potential intra-abdominal infection, or obstructive process.  Will obtain imaging of the abdomen and pelvis.  Regarding the patient's pain that she has on her lateral left leg, appears to be more of a peripheral nerve pain rather than sciatic pain.  She has no neurological deficits.  Reassessment 10:15 AM-reviewed the patient's labs, no leukocytosis, trace blood in urine.  Normal kidney function.  CT imaging abdomen pelvis normal.  Patient still having a bit of discomfort, no clear cause.  Discussed this with the patient, provided some Toradol.  Will also apply lidocaine patch to the back.  Patient is following up with her primary care doctor on Thursday.  Return precautions discussed with patient.      Lab Tests: -I ordered, reviewed, and interpreted labs.   The pertinent results include:   Labs Reviewed  COMPREHENSIVE METABOLIC PANEL  HCG, SERUM, QUALITATIVE  CBC WITH DIFFERENTIAL/PLATELET  URINALYSIS, ROUTINE W REFLEX MICROSCOPIC      EKG   EKG Interpretation Date/Time:    Ventricular Rate:    PR Interval:    QRS Duration:    QT Interval:    QTC Calculation:   R Axis:      Text Interpretation:           Imaging Studies ordered: I ordered imaging studies including CT imaging of the abdomen pelvis I independently visualized and interpreted imaging. I  agree with the radiologist interpretation   Medicines ordered and prescription drug management: No orders of the defined types were placed in this encounter.   -I have reviewed the patients home medicines and have made adjustments as needed  Cardiac Monitoring: The patient was maintained on a cardiac monitor.  I personally viewed and interpreted the cardiac monitored which showed an underlying rhythm of: Normal sinus rhythm  Social Determinants of Health:  Factors impacting patients care include:    Reevaluation: After the interventions noted above, I reevaluated the patient and found that they have :improved  Co morbidities that complicate the patient evaluation  Past Medical History:  Diagnosis Date   Adult acne    ANA positive    Anemia    iron  infusions   Asthma    Chronic sinusitis    CT and MRI.    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   Fibromyalgia    Foot fracture, left 08/2012   History of appendicitis 09/2006   History of blood transfusion    last time 02/2018 hysterectomy surgery   Hypothyroidism    Intramural uterine fibroid 2019   Iron deficiency    Lymphoma (HCC)    x2, completed last round of chemo Jan 2024   Menorrhagia    Metatarsal fracture 10/2012   Right Proximal fifth metatarsal fracture   Obesity    Panic attack    at bedtime, awakes from sleep   Pelvic pain    resolved   Polyarthralgia    was seeing rheumatology   PONV (postoperative nausea and vomiting)       Dispostion: I considered admission for this patient, however with her reassuring imaging labs, believe patient is more appropriate for outpatient follow-up.     Final Clinical Impression(s) / ED Diagnoses Final diagnoses:  None     @PCDICTATION @    Anders Simmonds T, DO 04/03/23 1020

## 2023-05-23 ENCOUNTER — Telehealth: Admitting: Physician Assistant

## 2023-05-23 DIAGNOSIS — N760 Acute vaginitis: Secondary | ICD-10-CM

## 2023-05-23 DIAGNOSIS — B9689 Other specified bacterial agents as the cause of diseases classified elsewhere: Secondary | ICD-10-CM

## 2023-05-23 MED ORDER — METRONIDAZOLE 500 MG PO TABS: 500.0000 mg | ORAL_TABLET | Freq: Two times a day (BID) | ORAL | 0 refills | Status: AC

## 2023-05-23 NOTE — Progress Notes (Signed)
 E-Visit for Vaginal Symptoms  We are sorry that you are not feeling well. Here is how we plan to help! Based on what you shared with me it looks like you: May have a vaginosis due to bacteria  Vaginosis is an inflammation of the vagina that can result in discharge, itching and pain. The cause is usually a change in the normal balance of vaginal bacteria or an infection. Vaginosis can also result from reduced estrogen levels after menopause.  The most common causes of vaginosis are:   Bacterial vaginosis which results from an overgrowth of one on several organisms that are normally present in your vagina.   Yeast infections which are caused by a naturally occurring fungus called candida.   Vaginal atrophy (atrophic vaginosis) which results from the thinning of the vagina from reduced estrogen levels after menopause.   Trichomoniasis which is caused by a parasite and is commonly transmitted by sexual intercourse.  Factors that increase your risk of developing vaginosis include: Medications, such as antibiotics and steroids Uncontrolled diabetes Use of hygiene products such as bubble bath, vaginal spray or vaginal deodorant Douching Wearing damp or tight-fitting clothing Using an intrauterine device (IUD) for birth control Hormonal changes, such as those associated with pregnancy, birth control pills or menopause Sexual activity Having a sexually transmitted infection  Your treatment plan is Metronidazole or Flagyl 500mg  twice a day for 7 days.  I have electronically sent this prescription into the pharmacy that you have chosen.  Be sure to take all of the medication as directed. Stop taking any medication if you develop a rash, tongue swelling or shortness of breath. Mothers who are breast feeding should consider pumping and discarding their breast milk while on these antibiotics. However, there is no consensus that infant exposure at these doses would be harmful.  Remember that  medication creams can weaken latex condoms. SABRA   HOME CARE:  Good hygiene may prevent some types of vaginosis from recurring and may relieve some symptoms:  Avoid baths, hot tubs and whirlpool spas. Rinse soap from your outer genital area after a shower, and dry the area well to prevent irritation. Don't use scented or harsh soaps, such as those with deodorant or antibacterial action. Avoid irritants. These include scented tampons and pads. Wipe from front to back after using the toilet. Doing so avoids spreading fecal bacteria to your vagina.  Other things that may help prevent vaginosis include:  Don't douche. Your vagina doesn't require cleansing other than normal bathing. Repetitive douching disrupts the normal organisms that reside in the vagina and can actually increase your risk of vaginal infection. Douching won't clear up a vaginal infection. Use a latex condom. Both female and female latex condoms may help you avoid infections spread by sexual contact. Wear cotton underwear. Also wear pantyhose with a cotton crotch. If you feel comfortable without it, skip wearing underwear to bed. Yeast thrives in [employer] Your symptoms should improve in the next day or two.  GET HELP RIGHT AWAY IF:  You have pain in your lower abdomen ( pelvic area or over your ovaries) You develop nausea or vomiting You develop a fever Your discharge changes or worsens You have persistent pain with intercourse You develop shortness of breath, a rapid pulse, or you faint.  These symptoms could be signs of problems or infections that need to be evaluated by a medical provider now.  MAKE SURE YOU   Understand these instructions. Will watch your condition. Will get help right  away if you are not doing well or get worse.  Thank you for choosing an e-visit.  Your e-visit answers were reviewed by a board certified advanced clinical practitioner to complete your personal care plan. Depending upon the  condition, your plan could have included both over the counter or prescription medications.  Please review your pharmacy choice. Make sure the pharmacy is open so you can pick up prescription now. If there is a problem, you may contact your provider through [employer] and have the prescription routed to another pharmacy.  Your safety is important to us . If you have drug allergies check your prescription carefully.   For the next 24 hours you can use MyChart to ask questions about today's visit, request a non-urgent call back, or ask for a work or school excuse. You will get an email in the next two days asking about your experience. I hope that your e-visit has been valuable and will speed your recovery.  I have spent 5 minutes in review of e-visit questionnaire, review and updating patient chart, medical decision making and response to patient.   Delon CHRISTELLA Dickinson, PA-C

## 2023-07-07 ENCOUNTER — Other Ambulatory Visit: Payer: Self-pay | Admitting: Hematology and Oncology

## 2023-07-07 ENCOUNTER — Other Ambulatory Visit: Payer: Self-pay | Admitting: Gastroenterology

## 2023-07-14 ENCOUNTER — Ambulatory Visit: Admitting: Endocrinology

## 2023-07-19 ENCOUNTER — Encounter: Payer: Self-pay | Admitting: Endocrinology

## 2023-07-19 ENCOUNTER — Ambulatory Visit: Admitting: Endocrinology

## 2023-07-19 VITALS — BP 118/62 | HR 73 | Ht 63.0 in | Wt 173.0 lb

## 2023-07-19 DIAGNOSIS — E063 Autoimmune thyroiditis: Secondary | ICD-10-CM | POA: Diagnosis not present

## 2023-07-19 DIAGNOSIS — E039 Hypothyroidism, unspecified: Secondary | ICD-10-CM | POA: Diagnosis not present

## 2023-07-19 NOTE — Progress Notes (Signed)
 Outpatient Endocrinology Note Sydney Jasyah Theurer, MD  07/19/23  Patient's Name: Sydney Vaughn    DOB: 06-27-1975    MRN: [id number]  REASON OF VISIT: Follow-up for hypothyroidism  REFERRING PROVIDER:   PCP: Medicine, [facility]  HISTORY OF PRESENT ILLNESS:   Sydney Vaughn is a 48 y.o. old female with past medical history as listed below is presented for a follow up of hypothyroidism.  Patient was last seen by Dr. Lucianne Vaughn in April 2024.  Pertinent Thyroid History: Patient was previously seen by Dr. Lucianne Vaughn and was last time seen in April 2024.  Patient was diagnosed with hypothyroidism in July 2017.  She has been on thyroid hormone replacement since the diagnosis.  Initially started on levothyroxine 25 mcg daily and dose was gradually increased over time.  At the time of presentation she had symptoms of fatigue, weight gain and hair loss.  Per office note by Dr. Lucianne Vaughn because of abnormal PET scan done for her lymphoma showing activity in the thyroid diffusely, her PCP did an ultrasound which was unremarkable and antibodies which were positive as expected.   # Weight management : Patient started management for weight loss from 2017 she had seen dietitian initially and modified diet.  She had tried weight loss medication in the past including phentermine and Topamax, started in March 2018, lost about 18 pounds of weight at that time.  In 2019 Topamax was stopped because of her feeling that it was causing memory difficulties.  She was also on phentermine which was stopped around late 2023 due to feeling of jitteriness.  She had taken Contrave which was stopped due to nausea and dizziness, with first tablet.  Unable to get [facility] not covered by her insurance.  Patient has been doing lifestyle modification with exercise bike or rowing machine as well as walking for exercise.  She has been following diet plan and trying to stay natural.  She does not want to try any more weight loss  medications.  # Levothyroxine was increased from 100 to 112 mcg daily in August 2024 when TSH was high normal.   Interval history  Patient has been taking levothyroxine 112 mcg daily.  She denies palpitation or heat intolerance.  She reports compliance with levothyroxine.  She has lost about 3 to 5 pound of weight in last 4 to 6 months.  She reports she has been trying hard with lifestyle modification to lose weight.  Overall feels good.  No other complaints today.   REVIEW OF SYSTEMS:  As per history of present illness.   PAST MEDICAL HISTORY: Past Medical History:  Diagnosis Date   Adult acne    ANA positive    Anemia    iron infusions   Asthma    Chronic sinusitis    CT and MRI.    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   Fibromyalgia    Foot fracture, left 08/2012   History of appendicitis 09/2006   History of blood transfusion    last time 02/2018 hysterectomy surgery   Hypothyroidism    Intramural uterine fibroid 2019   Iron deficiency    Lymphoma (HCC)    x2, completed last round of chemo Jan 2024   Menorrhagia    Metatarsal fracture 10/2012   Right Proximal fifth metatarsal fracture   Obesity    Panic attack    at bedtime, awakes from sleep   Pelvic pain    resolved   Polyarthralgia  was seeing rheumatology   PONV (postoperative nausea and vomiting)     PAST SURGICAL HISTORY: Past Surgical History:  Procedure Laterality Date   ABDOMINAL HYSTERECTOMY  04/2018   APPENDECTOMY     BUNIONECTOMY Left    DILATION AND EVACUATION     PAROTIDECTOMY Right 03/04/2020   Procedure: right superficial PAROTIDECTOMY, with nerve preservation;  Surgeon: Sydney Reading, MD;  Location: [facility] OR;  Service: ENT;  Laterality: Right;   TUBAL LIGATION      ALLERGIES: Allergies  Allergen Reactions   Contrave [Naltrexone-Bupropion Hcl Er] Nausea Only    Nausea and dizziness   Oxycodone-Acetaminophen Er     Swollen face, eyes and caused itching    Rituximab-Abbs  [Rituximab] Nausea Only and Other (See Comments)    Dizziness, "felt like something in throat", headache    FAMILY HISTORY:  Family History  Problem Relation Age of Onset   Thyroid disease Mother    Sinusitis Sister    Sinusitis Brother    Lupus Daughter        treated at [facility]   Allergies Daughter    Colon cancer Neg Hx    Colon polyps Neg Hx    Esophageal cancer Neg Hx    Rectal cancer Neg Hx    Stomach cancer Neg Hx     SOCIAL HISTORY: Social History   Socioeconomic History   Marital status: Married    Spouse name: Not on file   Number of children: 4   Years of education: Not on file   Highest education level: Not on file  Occupational History    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 lives in [place] with her husband and children. She has 4 daughters: ages ranging from 80 yo- 60 yo. 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     Social Drivers of Health   Financial Resource Strain: Low Risk  (09/13/2022)   Received from [facility], [facility]   Overall Financial Resource Strain (CARDIA)    Difficulty of Paying Living Expenses: Not very hard  Food Insecurity: No Food Insecurity (09/13/2022)   Received from [facility], [facility]   Hunger Vital Sign    Worried About Running Out of Food in the Last Year: Never true    Ran Out of Food in the Last Year: Never true  Transportation Needs: No Transportation Needs (09/13/2022)   Received from [facility], [facility]   PRAPARE - Transportation    Lack of Transportation  (Medical): No    Lack of Transportation (Non-Medical): No  Physical Activity: Insufficiently Active (09/13/2022)   Received from [facility], [facility]   Exercise Vital Sign    Days of Exercise per Week: 1 day    Minutes of Exercise per Session: 40 min  Stress: Stress Concern Present (09/13/2022)   Received from [facility], [facility] - Occupational Stress Questionnaire    Feeling of Stress :  Very much  Social Connections: Somewhat Isolated (09/13/2022)   Received from [facility], [facility]   Social Network    How would you rate your social network (family, work, friends)?: Restricted participation with some degree of social isolation    MEDICATIONS:  Current Outpatient Medications  Medication Sig Dispense Refill   acetaminophen (TYLENOL) 500 MG tablet Take 500 mg by mouth daily as needed for mild pain (pain score 1-3).     albuterol (PROVENTIL HFA;VENTOLIN HFA) 108 (90 Base) MCG/ACT inhaler Inhale 1-2 puffs into the lungs every 4 (four) hours as needed for wheezing or shortness of breath. 1 Inhaler 5   amphetamine-dextroamphetamine (ADDERALL XR) 10 MG 24 hr capsule Take 10 mg by mouth daily.     levothyroxine (SYNTHROID) 112 MCG tablet Take 1 tablet (112 mcg total) by mouth daily before breakfast.     lidocaine (LIDODERM) 5 % Place 1 patch onto the skin daily. Remove & Discard patch within 12 hours or as directed by MD 30 patch 0   SUMAtriptan (IMITREX) 50 MG tablet TAKE 1 TABLET (50 MG DOSE) BY MOUTH ONCE AS NEEDED FOR MIGRAINE FOR UP TO 1 DOSE.     Vitamin D, Ergocalciferol, (DRISDOL) 1.25 MG (50000 UNIT) CAPS capsule TAKE 1 CAPSULE (50000 UNITS DOSE) BY MOUTH ONE TIME PER WEEK AT 0900     zolpidem (AMBIEN) 10 MG tablet Take by mouth.     No current facility-administered medications for this visit.    PHYSICAL EXAM: Vitals:   07/19/23 1523  BP: 118/62  Pulse: 73  SpO2: 98%  Weight: 173 lb (78.5 kg)  Height: 5\' 3"   (1.6 m)   Body mass index is 30.65 kg/m.  Wt Readings from Last 3 Encounters:  07/19/23 173 lb (78.5 kg)  04/03/23 170 lb (77.1 kg)  03/06/23 173 lb 9.6 oz (78.7 kg)     General: Well developed, well nourished female in no apparent distress.  HEENT: AT/[place], no external lesions. Hearing intact to the spoken word Eyes: Conjunctiva clear and no icterus. Neck: Trachea midline, neck supple Abdomen: Soft, non tender, non distended Neurologic: Alert, oriented, normal speech, deep tendon biceps reflexes normal Extremities: No pedal pitting edema, no tremors of outstretched hands Skin: Warm, color good.  Psychiatric: Does not appear depressed or anxious  PERTINENT HISTORIC LABORATORY AND IMAGING STUDIES:  All pertinent laboratory results were reviewed. Please see HPI also for further details.   TSH  Date Value Ref Range Status  01/13/2023 4.20 0.35 - 5.50 uIU/mL Final  01/12/2023 5.23 0.35 - 5.50 uIU/mL Final  08/22/2022 1.43 0.35 - 5.50 uIU/mL Final     ASSESSMENT / PLAN  1. Primary hypothyroidism   2. Acquired autoimmune hypothyroidism    Patient has primary hypothyroidism secondary to Hashimoto's thyroiditis.  She has been on thyroid hormone replacement since the diagnosis in 2017. -Patient has been currently taking levothyroxine 112 mcg daily.  She is clinically euthyroid today.  Plan: -Check thyroid function test TSH, free T4 and adjust the dose of levothyroxine if needed.   Sydney Vaughn was seen today for follow-up.  Diagnoses and all orders for this visit:  Primary hypothyroidism -     T4, free -     TSH  Acquired autoimmune hypothyroidism   DISPOSITION Follow up in clinic in 6 months suggested.  Labs on the same day of the visit.  All questions answered and patient verbalized understanding of the plan.  Sydney Betzalel Umbarger, MD [facility] [address]  [place], [address] [place], [place] [address] Phone # [phone]  At least part of this  note was generated using voice recognition software. Inadvertent word errors may have occurred, which were not recognized during the proofreading process.

## 2023-07-20 ENCOUNTER — Encounter: Payer: Self-pay | Admitting: Endocrinology

## 2023-07-20 LAB — T4, FREE: Free T4: 0.9 ng/dL (ref 0.8–1.8)

## 2023-07-20 LAB — TSH: TSH: 3.88 m[IU]/L

## 2023-07-20 MED ORDER — LEVOTHYROXINE SODIUM 112 MCG PO TABS: 112.0000 ug | ORAL_TABLET | Freq: Every day | ORAL | 3 refills | Status: AC

## 2023-07-20 NOTE — Addendum Note (Signed)
 Addended by: Carline Dura, [place] on: 07/20/2023 08:18 AM   Modules accepted: Orders

## 2024-01-24 ENCOUNTER — Ambulatory Visit: Admitting: Endocrinology
